# Patient Record
Sex: Female | Born: 1935 | Race: Black or African American | Hispanic: No | Marital: Single | Smoking: Former smoker
Health system: Southern US, Community
[De-identification: ages and names within clinical notes are randomized; demographics above are authoritative.]

## PROBLEM LIST (undated history)

## (undated) DIAGNOSIS — R011 Cardiac murmur, unspecified: Secondary | ICD-10-CM

## (undated) DIAGNOSIS — M4800 Spinal stenosis, site unspecified: Secondary | ICD-10-CM

## (undated) DIAGNOSIS — F0390 Unspecified dementia without behavioral disturbance: Secondary | ICD-10-CM

## (undated) DIAGNOSIS — I441 Atrioventricular block, second degree: Secondary | ICD-10-CM

## (undated) DIAGNOSIS — I10 Essential (primary) hypertension: Secondary | ICD-10-CM

## (undated) DIAGNOSIS — M1990 Unspecified osteoarthritis, unspecified site: Secondary | ICD-10-CM

## (undated) DIAGNOSIS — I4891 Unspecified atrial fibrillation: Secondary | ICD-10-CM

## (undated) DIAGNOSIS — N189 Chronic kidney disease, unspecified: Secondary | ICD-10-CM

## (undated) DIAGNOSIS — F05 Delirium due to known physiological condition: Secondary | ICD-10-CM

## (undated) DIAGNOSIS — D649 Anemia, unspecified: Secondary | ICD-10-CM

## (undated) DIAGNOSIS — I509 Heart failure, unspecified: Secondary | ICD-10-CM

## (undated) DIAGNOSIS — I480 Paroxysmal atrial fibrillation: Secondary | ICD-10-CM

## (undated) DIAGNOSIS — M159 Polyosteoarthritis, unspecified: Secondary | ICD-10-CM

## (undated) HISTORY — DX: Cardiac murmur, unspecified: R01.1

## (undated) HISTORY — DX: Paroxysmal atrial fibrillation: I48.0

## (undated) HISTORY — DX: Chronic kidney disease, unspecified: N18.9

## (undated) HISTORY — DX: Heart failure, unspecified: I50.9

## (undated) HISTORY — DX: Polyosteoarthritis, unspecified: M15.9

## (undated) HISTORY — DX: Atrioventricular block, second degree: I44.1

## (undated) HISTORY — PX: TONSILLECTOMY: SUR1361

## (undated) HISTORY — DX: Anemia, unspecified: D64.9

## (undated) HISTORY — PX: NEPHRECTOMY: SHX65

---

## 1997-10-13 ENCOUNTER — Ambulatory Visit: Admission: RE | Admit: 1997-10-13 | Discharge: 1997-10-13 | Payer: Self-pay | Admitting: Internal Medicine

## 1998-05-22 ENCOUNTER — Ambulatory Visit (HOSPITAL_COMMUNITY): Admission: RE | Admit: 1998-05-22 | Discharge: 1998-05-22 | Payer: Self-pay | Admitting: Internal Medicine

## 1998-05-22 ENCOUNTER — Encounter: Payer: Self-pay | Admitting: Internal Medicine

## 1998-09-09 ENCOUNTER — Ambulatory Visit (HOSPITAL_COMMUNITY): Admission: RE | Admit: 1998-09-09 | Discharge: 1998-09-09 | Payer: Self-pay | Admitting: Gastroenterology

## 1998-11-19 ENCOUNTER — Ambulatory Visit (HOSPITAL_COMMUNITY): Admission: RE | Admit: 1998-11-19 | Discharge: 1998-11-19 | Payer: Self-pay | Admitting: Internal Medicine

## 1998-11-19 ENCOUNTER — Encounter: Payer: Self-pay | Admitting: Internal Medicine

## 1999-12-07 ENCOUNTER — Ambulatory Visit (HOSPITAL_COMMUNITY): Admission: RE | Admit: 1999-12-07 | Discharge: 1999-12-07 | Payer: Self-pay | Admitting: Internal Medicine

## 1999-12-07 ENCOUNTER — Encounter: Payer: Self-pay | Admitting: Internal Medicine

## 2000-12-08 ENCOUNTER — Encounter: Payer: Self-pay | Admitting: Internal Medicine

## 2000-12-08 ENCOUNTER — Ambulatory Visit (HOSPITAL_COMMUNITY): Admission: RE | Admit: 2000-12-08 | Discharge: 2000-12-08 | Payer: Self-pay | Admitting: Internal Medicine

## 2001-01-01 ENCOUNTER — Other Ambulatory Visit: Admission: RE | Admit: 2001-01-01 | Discharge: 2001-01-01 | Payer: Self-pay | Admitting: Internal Medicine

## 2002-02-12 ENCOUNTER — Ambulatory Visit (HOSPITAL_COMMUNITY): Admission: RE | Admit: 2002-02-12 | Discharge: 2002-02-12 | Payer: Self-pay | Admitting: Internal Medicine

## 2002-02-12 ENCOUNTER — Encounter: Payer: Self-pay | Admitting: Internal Medicine

## 2003-02-17 ENCOUNTER — Encounter: Payer: Self-pay | Admitting: Internal Medicine

## 2003-02-17 ENCOUNTER — Ambulatory Visit (HOSPITAL_COMMUNITY): Admission: RE | Admit: 2003-02-17 | Discharge: 2003-02-17 | Payer: Self-pay | Admitting: Internal Medicine

## 2004-02-19 ENCOUNTER — Ambulatory Visit (HOSPITAL_COMMUNITY): Admission: RE | Admit: 2004-02-19 | Discharge: 2004-02-19 | Payer: Self-pay | Admitting: Internal Medicine

## 2004-03-19 ENCOUNTER — Emergency Department (HOSPITAL_COMMUNITY): Admission: EM | Admit: 2004-03-19 | Discharge: 2004-03-19 | Payer: Self-pay | Admitting: Emergency Medicine

## 2005-02-21 ENCOUNTER — Ambulatory Visit (HOSPITAL_COMMUNITY): Admission: RE | Admit: 2005-02-21 | Discharge: 2005-02-21 | Payer: Self-pay | Admitting: Internal Medicine

## 2006-02-22 ENCOUNTER — Ambulatory Visit (HOSPITAL_COMMUNITY): Admission: RE | Admit: 2006-02-22 | Discharge: 2006-02-22 | Payer: Self-pay | Admitting: Internal Medicine

## 2006-06-05 ENCOUNTER — Encounter: Admission: RE | Admit: 2006-06-05 | Discharge: 2006-06-05 | Payer: Self-pay | Admitting: Internal Medicine

## 2006-06-20 ENCOUNTER — Encounter: Admission: RE | Admit: 2006-06-20 | Discharge: 2006-06-20 | Payer: Self-pay | Admitting: Internal Medicine

## 2006-06-22 ENCOUNTER — Emergency Department (HOSPITAL_COMMUNITY): Admission: EM | Admit: 2006-06-22 | Discharge: 2006-06-22 | Payer: Self-pay | Admitting: Emergency Medicine

## 2006-07-07 ENCOUNTER — Encounter: Admission: RE | Admit: 2006-07-07 | Discharge: 2006-07-07 | Payer: Self-pay | Admitting: Internal Medicine

## 2006-07-28 ENCOUNTER — Ambulatory Visit (HOSPITAL_COMMUNITY): Admission: RE | Admit: 2006-07-28 | Discharge: 2006-07-28 | Payer: Self-pay | Admitting: Urology

## 2006-08-11 ENCOUNTER — Ambulatory Visit (HOSPITAL_COMMUNITY): Admission: RE | Admit: 2006-08-11 | Discharge: 2006-08-11 | Payer: Self-pay | Admitting: Urology

## 2006-08-17 ENCOUNTER — Encounter: Admission: RE | Admit: 2006-08-17 | Discharge: 2006-08-17 | Payer: Self-pay | Admitting: Urology

## 2006-09-18 ENCOUNTER — Ambulatory Visit: Payer: Self-pay | Admitting: Cardiology

## 2006-09-18 ENCOUNTER — Inpatient Hospital Stay (HOSPITAL_COMMUNITY): Admission: RE | Admit: 2006-09-18 | Discharge: 2006-09-24 | Payer: Self-pay | Admitting: Urology

## 2006-09-18 ENCOUNTER — Encounter (INDEPENDENT_AMBULATORY_CARE_PROVIDER_SITE_OTHER): Payer: Self-pay | Admitting: Specialist

## 2006-09-22 ENCOUNTER — Encounter: Payer: Self-pay | Admitting: Cardiology

## 2006-09-28 ENCOUNTER — Emergency Department (HOSPITAL_COMMUNITY): Admission: EM | Admit: 2006-09-28 | Discharge: 2006-09-28 | Payer: Self-pay | Admitting: Emergency Medicine

## 2006-09-28 ENCOUNTER — Encounter: Payer: Self-pay | Admitting: Vascular Surgery

## 2006-09-28 ENCOUNTER — Ambulatory Visit: Payer: Self-pay | Admitting: Vascular Surgery

## 2007-02-17 ENCOUNTER — Emergency Department (HOSPITAL_COMMUNITY): Admission: EM | Admit: 2007-02-17 | Discharge: 2007-02-17 | Payer: Self-pay | Admitting: Family Medicine

## 2007-02-27 ENCOUNTER — Ambulatory Visit (HOSPITAL_COMMUNITY): Admission: RE | Admit: 2007-02-27 | Discharge: 2007-02-27 | Payer: Self-pay | Admitting: *Deleted

## 2007-03-08 ENCOUNTER — Encounter: Admission: RE | Admit: 2007-03-08 | Discharge: 2007-03-08 | Payer: Self-pay | Admitting: *Deleted

## 2007-03-09 ENCOUNTER — Observation Stay (HOSPITAL_COMMUNITY): Admission: EM | Admit: 2007-03-09 | Discharge: 2007-03-10 | Payer: Self-pay | Admitting: Emergency Medicine

## 2008-01-01 ENCOUNTER — Emergency Department (HOSPITAL_COMMUNITY): Admission: EM | Admit: 2008-01-01 | Discharge: 2008-01-01 | Payer: Self-pay | Admitting: Family Medicine

## 2008-03-20 ENCOUNTER — Ambulatory Visit (HOSPITAL_COMMUNITY): Admission: RE | Admit: 2008-03-20 | Discharge: 2008-03-20 | Payer: Self-pay | Admitting: Family Medicine

## 2008-07-23 ENCOUNTER — Ambulatory Visit: Payer: Self-pay | Admitting: Diagnostic Radiology

## 2008-07-23 ENCOUNTER — Emergency Department (HOSPITAL_BASED_OUTPATIENT_CLINIC_OR_DEPARTMENT_OTHER): Admission: EM | Admit: 2008-07-23 | Discharge: 2008-07-23 | Payer: Self-pay | Admitting: Emergency Medicine

## 2008-10-08 ENCOUNTER — Encounter: Admission: RE | Admit: 2008-10-08 | Discharge: 2008-10-08 | Payer: Self-pay | Admitting: Family Medicine

## 2009-03-23 ENCOUNTER — Ambulatory Visit (HOSPITAL_COMMUNITY): Admission: RE | Admit: 2009-03-23 | Discharge: 2009-03-23 | Payer: Self-pay | Admitting: Family Medicine

## 2009-08-18 ENCOUNTER — Ambulatory Visit: Payer: Self-pay | Admitting: Interventional Radiology

## 2009-08-18 ENCOUNTER — Emergency Department (HOSPITAL_BASED_OUTPATIENT_CLINIC_OR_DEPARTMENT_OTHER): Admission: EM | Admit: 2009-08-18 | Discharge: 2009-08-18 | Payer: Self-pay | Admitting: Emergency Medicine

## 2010-07-16 ENCOUNTER — Encounter
Admission: RE | Admit: 2010-07-16 | Discharge: 2010-07-16 | Payer: Self-pay | Source: Home / Self Care | Attending: Family Medicine | Admitting: Family Medicine

## 2010-08-01 ENCOUNTER — Encounter: Payer: Self-pay | Admitting: Internal Medicine

## 2010-08-01 ENCOUNTER — Encounter: Payer: Self-pay | Admitting: Gastroenterology

## 2010-09-17 ENCOUNTER — Other Ambulatory Visit: Payer: Self-pay | Admitting: Family Medicine

## 2010-09-17 ENCOUNTER — Ambulatory Visit
Admission: RE | Admit: 2010-09-17 | Discharge: 2010-09-17 | Disposition: A | Discharge status: Home / Self Care | Payer: No Typology Code available for payment source | Source: Ambulatory Visit | Attending: Family Medicine | Admitting: Family Medicine

## 2010-09-17 DIAGNOSIS — W19XXXA Unspecified fall, initial encounter: Secondary | ICD-10-CM

## 2010-09-28 ENCOUNTER — Emergency Department (HOSPITAL_COMMUNITY): Payer: Medicare (Managed Care)

## 2010-09-28 ENCOUNTER — Emergency Department (HOSPITAL_COMMUNITY)
Admission: EM | Admit: 2010-09-28 | Discharge: 2010-09-28 | Disposition: A | Discharge status: Home / Self Care | Payer: Medicare (Managed Care) | Attending: Emergency Medicine | Admitting: Emergency Medicine

## 2010-09-28 DIAGNOSIS — F0280 Dementia in other diseases classified elsewhere without behavioral disturbance: Secondary | ICD-10-CM | POA: Insufficient documentation

## 2010-09-28 DIAGNOSIS — R0609 Other forms of dyspnea: Secondary | ICD-10-CM | POA: Insufficient documentation

## 2010-09-28 DIAGNOSIS — R0989 Other specified symptoms and signs involving the circulatory and respiratory systems: Secondary | ICD-10-CM | POA: Insufficient documentation

## 2010-09-28 DIAGNOSIS — R42 Dizziness and giddiness: Secondary | ICD-10-CM | POA: Insufficient documentation

## 2010-09-28 DIAGNOSIS — R609 Edema, unspecified: Secondary | ICD-10-CM | POA: Insufficient documentation

## 2010-09-28 DIAGNOSIS — G309 Alzheimer's disease, unspecified: Secondary | ICD-10-CM | POA: Insufficient documentation

## 2010-09-28 DIAGNOSIS — M069 Rheumatoid arthritis, unspecified: Secondary | ICD-10-CM | POA: Insufficient documentation

## 2010-09-28 DIAGNOSIS — Z79899 Other long term (current) drug therapy: Secondary | ICD-10-CM | POA: Insufficient documentation

## 2010-09-28 DIAGNOSIS — I10 Essential (primary) hypertension: Secondary | ICD-10-CM | POA: Insufficient documentation

## 2010-09-28 LAB — BASIC METABOLIC PANEL
CO2: 24 mEq/L (ref 19–32)
Calcium: 8.5 mg/dL (ref 8.4–10.5)
Chloride: 108 mEq/L (ref 96–112)
GFR calc Af Amer: 28 mL/min — ABNORMAL LOW (ref >60)
Sodium: 144 mEq/L (ref 135–145)

## 2010-09-28 LAB — CBC
HCT: 26.2 % — ABNORMAL LOW (ref 36.0–46.0)
Hemoglobin: 9.2 g/dL — ABNORMAL LOW (ref 12.0–15.0)
MCHC: 35.1 g/dL (ref 30.0–36.0)
MCV: 85.6 fL (ref 78.0–100.0)
WBC: 8.5 10*3/uL (ref 4.0–10.5)

## 2010-09-28 LAB — POCT CARDIAC MARKERS
CKMB, poc: 6.9 ng/mL (ref 1.0–8.0)
Myoglobin, poc: >500 ng/mL (ref 12–200)
Troponin i, poc: 0.09 ng/mL (ref 0.00–0.09)

## 2010-09-28 LAB — DIFFERENTIAL
Basophils Absolute: 0 10*3/uL (ref 0.0–0.1)
Basophils Relative: 0 % (ref 0–1)
Eosinophils Absolute: 0 10*3/uL (ref 0.0–0.7)
Eosinophils Relative: 1 % (ref 0–5)
Lymphocytes Relative: 29 % (ref 12–46)
Monocytes Absolute: 0.8 10*3/uL (ref 0.1–1.0)
Neutrophils Relative %: 61 % (ref 43–77)

## 2010-09-28 LAB — BRAIN NATRIURETIC PEPTIDE: Pro B Natriuretic peptide (BNP): 156 pg/mL — ABNORMAL HIGH (ref 0.0–100.0)

## 2010-09-28 LAB — URINALYSIS, ROUTINE W REFLEX MICROSCOPIC
Hgb urine dipstick: NEGATIVE
Protein, ur: 30 mg/dL — AB
Specific Gravity, Urine: 1.017 (ref 1.005–1.030)
Urobilinogen, UA: 1 mg/dL (ref 0.0–1.0)

## 2010-09-28 LAB — URINE MICROSCOPIC-ADD ON

## 2010-09-29 ENCOUNTER — Other Ambulatory Visit: Payer: Medicare Other | Admitting: Sports Medicine

## 2010-09-29 ENCOUNTER — Emergency Department (HOSPITAL_COMMUNITY): Payer: Medicare (Managed Care)

## 2010-09-29 ENCOUNTER — Ambulatory Visit (INDEPENDENT_AMBULATORY_CARE_PROVIDER_SITE_OTHER): Payer: Medicare Other | Admitting: Family Medicine

## 2010-09-29 ENCOUNTER — Inpatient Hospital Stay (HOSPITAL_COMMUNITY)
Admission: EM | Admit: 2010-09-29 | Discharge: 2010-10-04 | DRG: 872 | Disposition: A | Discharge status: Home Health | Payer: Medicare (Managed Care) | Attending: Family Medicine | Admitting: Family Medicine

## 2010-09-29 DIAGNOSIS — N179 Acute kidney failure, unspecified: Secondary | ICD-10-CM | POA: Diagnosis present

## 2010-09-29 DIAGNOSIS — R6510 Systemic inflammatory response syndrome (SIRS) of non-infectious origin without acute organ dysfunction: Secondary | ICD-10-CM | POA: Diagnosis present

## 2010-09-29 DIAGNOSIS — D649 Anemia, unspecified: Secondary | ICD-10-CM | POA: Diagnosis present

## 2010-09-29 DIAGNOSIS — M810 Age-related osteoporosis without current pathological fracture: Secondary | ICD-10-CM | POA: Diagnosis present

## 2010-09-29 DIAGNOSIS — I129 Hypertensive chronic kidney disease with stage 1 through stage 4 chronic kidney disease, or unspecified chronic kidney disease: Secondary | ICD-10-CM | POA: Diagnosis present

## 2010-09-29 DIAGNOSIS — S40029A Contusion of unspecified upper arm, initial encounter: Secondary | ICD-10-CM | POA: Diagnosis present

## 2010-09-29 DIAGNOSIS — S46909A Unspecified injury of unspecified muscle, fascia and tendon at shoulder and upper arm level, unspecified arm, initial encounter: Secondary | ICD-10-CM | POA: Diagnosis present

## 2010-09-29 DIAGNOSIS — E860 Dehydration: Secondary | ICD-10-CM | POA: Diagnosis present

## 2010-09-29 DIAGNOSIS — I959 Hypotension, unspecified: Secondary | ICD-10-CM

## 2010-09-29 DIAGNOSIS — Z9181 History of falling: Secondary | ICD-10-CM

## 2010-09-29 DIAGNOSIS — I4891 Unspecified atrial fibrillation: Secondary | ICD-10-CM | POA: Diagnosis present

## 2010-09-29 DIAGNOSIS — N184 Chronic kidney disease, stage 4 (severe): Secondary | ICD-10-CM | POA: Diagnosis present

## 2010-09-29 DIAGNOSIS — F068 Other specified mental disorders due to known physiological condition: Secondary | ICD-10-CM | POA: Diagnosis present

## 2010-09-29 DIAGNOSIS — I509 Heart failure, unspecified: Secondary | ICD-10-CM | POA: Diagnosis present

## 2010-09-29 DIAGNOSIS — S41109A Unspecified open wound of unspecified upper arm, initial encounter: Secondary | ICD-10-CM | POA: Diagnosis present

## 2010-09-29 DIAGNOSIS — A419 Sepsis, unspecified organism: Principal | ICD-10-CM | POA: Diagnosis present

## 2010-09-29 DIAGNOSIS — Z7982 Long term (current) use of aspirin: Secondary | ICD-10-CM

## 2010-09-29 LAB — COMPREHENSIVE METABOLIC PANEL
AST: 42 U/L — ABNORMAL HIGH (ref 0–37)
Albumin: 2.7 g/dL — ABNORMAL LOW (ref 3.5–5.2)
Alkaline Phosphatase: 84 U/L (ref 39–117)
BUN: 40 mg/dL — ABNORMAL HIGH (ref 6–23)
Chloride: 109 mEq/L (ref 96–112)
GFR calc Af Amer: 24 mL/min — ABNORMAL LOW (ref >60)
Potassium: 4.1 mEq/L (ref 3.5–5.1)
Sodium: 141 mEq/L (ref 135–145)
Total Bilirubin: 0.7 mg/dL (ref 0.3–1.2)
Total Protein: 6.5 g/dL (ref 6.0–8.3)

## 2010-09-29 LAB — DIFFERENTIAL
Basophils Absolute: 0 10*3/uL (ref 0.0–0.1)
Basophils Relative: 0 % (ref 0–1)
Eosinophils Absolute: 0 10*3/uL (ref 0.0–0.7)
Eosinophils Relative: 1 % (ref 0–5)
Lymphs Abs: 2 10*3/uL (ref 0.7–4.0)
Neutrophils Relative %: 64 % (ref 43–77)

## 2010-09-29 LAB — CK TOTAL AND CKMB (NOT AT ARMC)
CK, MB: 4.2 ng/mL — ABNORMAL HIGH (ref 0.3–4.0)
Total CK: 497 U/L — ABNORMAL HIGH (ref 7–177)

## 2010-09-29 LAB — URINE CULTURE
Colony Count: NO GROWTH
Culture  Setup Time: 201203201335
Culture: NO GROWTH

## 2010-09-29 LAB — URINALYSIS, ROUTINE W REFLEX MICROSCOPIC
Glucose, UA: NEGATIVE mg/dL
Ketones, ur: NEGATIVE mg/dL
Nitrite: NEGATIVE
Protein, ur: NEGATIVE mg/dL
pH: 6 (ref 5.0–8.0)

## 2010-09-29 LAB — PROCALCITONIN: Procalcitonin: <0.1 ng/mL

## 2010-09-29 LAB — GLUCOSE, CAPILLARY: Glucose-Capillary: 87 mg/dL (ref 70–99)

## 2010-09-29 LAB — CBC
Platelets: 174 10*3/uL (ref 150–400)
RBC: 2.82 MIL/uL — ABNORMAL LOW (ref 3.87–5.11)
RDW: 16.8 % — ABNORMAL HIGH (ref 11.5–15.5)
WBC: 7.7 10*3/uL (ref 4.0–10.5)

## 2010-09-29 LAB — TROPONIN I: Troponin I: 0.05 ng/mL (ref 0.00–0.06)

## 2010-09-30 ENCOUNTER — Inpatient Hospital Stay (HOSPITAL_COMMUNITY): Payer: Medicare (Managed Care)

## 2010-09-30 ENCOUNTER — Ambulatory Visit (INDEPENDENT_AMBULATORY_CARE_PROVIDER_SITE_OTHER): Payer: Medicare Other | Admitting: Family Medicine

## 2010-09-30 VITALS — BP 80/35 | HR 110 | Resp 20

## 2010-09-30 DIAGNOSIS — R4182 Altered mental status, unspecified: Secondary | ICD-10-CM

## 2010-09-30 DIAGNOSIS — M79609 Pain in unspecified limb: Secondary | ICD-10-CM

## 2010-09-30 DIAGNOSIS — I359 Nonrheumatic aortic valve disorder, unspecified: Secondary | ICD-10-CM

## 2010-09-30 DIAGNOSIS — M7989 Other specified soft tissue disorders: Secondary | ICD-10-CM

## 2010-09-30 LAB — CARDIAC PANEL(CRET KIN+CKTOT+MB+TROPI)
CK, MB: 2.9 ng/mL (ref 0.3–4.0)
Relative Index: 1.2 (ref 0.0–2.5)
Total CK: 349 U/L — ABNORMAL HIGH (ref 7–177)
Troponin I: 0.05 ng/mL (ref 0.00–0.06)

## 2010-09-30 LAB — BASIC METABOLIC PANEL
BUN: 39 mg/dL — ABNORMAL HIGH (ref 6–23)
CO2: 21 mEq/L (ref 19–32)
Calcium: 7.8 mg/dL — ABNORMAL LOW (ref 8.4–10.5)
Creatinine, Ser: 2.27 mg/dL — ABNORMAL HIGH (ref 0.4–1.2)
GFR calc non Af Amer: 20 mL/min — ABNORMAL LOW (ref >60)
Glucose, Bld: 68 mg/dL — ABNORMAL LOW (ref 70–99)

## 2010-09-30 LAB — CBC
HCT: 20.5 % — ABNORMAL LOW (ref 36.0–46.0)
HCT: 18.6 % — ABNORMAL LOW (ref 36.0–46.0)
Hemoglobin: 6.8 g/dL — CL (ref 12.0–15.0)
MCH: 31.6 pg (ref 26.0–34.0)
MCHC: 36.6 g/dL — ABNORMAL HIGH (ref 30.0–36.0)
MCHC: 36.6 g/dL — ABNORMAL HIGH (ref 30.0–36.0)
MCV: 85.8 fL (ref 78.0–100.0)
MCV: 86.3 fL (ref 78.0–100.0)
MCV: 86.5 fL (ref 78.0–100.0)
Platelets: 175 10*3/uL (ref 150–400)
Platelets: 184 10*3/uL (ref 150–400)
RBC: 2.11 MIL/uL — ABNORMAL LOW (ref 3.87–5.11)
RDW: 16.9 % — ABNORMAL HIGH (ref 11.5–15.5)
WBC: 8 10*3/uL (ref 4.0–10.5)

## 2010-09-30 LAB — GLUCOSE, CAPILLARY
Glucose-Capillary: 144 mg/dL — ABNORMAL HIGH (ref 70–99)
Glucose-Capillary: 61 mg/dL — ABNORMAL LOW (ref 70–99)

## 2010-09-30 LAB — URINE CULTURE: Colony Count: NO GROWTH

## 2010-09-30 LAB — ABO/RH: ABO/RH(D): O POS

## 2010-09-30 LAB — HEPARIN LEVEL (UNFRACTIONATED): Heparin Unfractionated: 0.49 IU/mL (ref 0.30–0.70)

## 2010-09-30 NOTE — Progress Notes (Signed)
Subjective:    Patient ID: Kathleen [name] is a 77 y.o. female.  Chief Complaint: HPI Patient placed on am schedule for Dr Darrick Penna but family was unable to transport. They brought her thios afternoon to [facility] and I saw her emergently.  CC: 2-3 weeks ago she had a fall at home where she trapped her arm/shoulder  (right) briefly between a piece of furniture and either the wall or another piece of furniture. She  Had an x ray done of her elbow a few days later for continud pain.  She has complained of right arm / shoulder pain since and the right upper extremity was noted by family to be bruised and swollen.  Yesterday the family took her to the ED for arm pain and some decrease in mental status. [facility] and was evidently seen by [facility] physician there (Dr Dorothe Pea). Was diagnosed with uti and given some meds--family says she was given an antibiotic and something for dementia. (I have attempted review of ED visit and i tis done by Dr Nino Parsley and does not mention medicine for dementia).  This morning she was not as alert and seemed to be in a lot of pain with her right UE. She presents now to [facility] for me to evaluate right upper extremity.   ROS     Unable to obtain except from family as in HPI  As patient is non responsive to any veral stimuli. She is minimally responsive to deep stimuli (chest rub)  Objective:   Ortho Exam  Patient is an elderly AA female slumped in a wheel chair, head lolling backward. She is responsive minimally to sternal rub. Her mouth is hanging open and she has obviously very dry mucous membranes. Her pulse is 110-120 and seems slightly irregular. Her right upper extremity is swollen, multiple echymoses, She is warm ans slightly clammy to the touch.     Assessment:     . Severely ill elderly patient--differential includes SIRS/sepsis, CVA, or other major event. I briefly discussed this with her two grand-daughters who brought her and have talked via  phone to Ellison Hughs (daughter). I advise emergent transfer via ambulance or car to the ED for further evaluation. I do not know how this works with the [employer], but I have discussed with our inpatient doctor (Dr Mauricio Po) and we will be happy to admit her to our service. Potentially she has an UE thrombosis as well. I have spoke to the triage nurse at [facility] and she has my pager number as well as the admitting residents info.    Plan:    Emergent transfer to ED

## 2010-10-01 LAB — BASIC METABOLIC PANEL
CO2: 23 mEq/L (ref 19–32)
Calcium: 7.5 mg/dL — ABNORMAL LOW (ref 8.4–10.5)
GFR calc Af Amer: 24 mL/min — ABNORMAL LOW (ref >60)
GFR calc non Af Amer: 20 mL/min — ABNORMAL LOW (ref >60)
Glucose, Bld: 119 mg/dL — ABNORMAL HIGH (ref 70–99)
Potassium: 3.5 mEq/L (ref 3.5–5.1)
Sodium: 145 mEq/L (ref 135–145)

## 2010-10-01 LAB — CBC
HCT: 17.9 % — ABNORMAL LOW (ref 36.0–46.0)
Hemoglobin: 6.5 g/dL — CL (ref 12.0–15.0)
MCV: 84.4 fL (ref 78.0–100.0)
Platelets: ADEQUATE 10*3/uL (ref 150–400)
RBC: 3.21 MIL/uL — ABNORMAL LOW (ref 3.87–5.11)
RDW: 18.3 % — ABNORMAL HIGH (ref 11.5–15.5)
WBC: 6.7 10*3/uL (ref 4.0–10.5)
WBC: 8.2 10*3/uL (ref 4.0–10.5)

## 2010-10-01 LAB — PROCALCITONIN: Procalcitonin: 0.19 ng/mL

## 2010-10-02 LAB — BASIC METABOLIC PANEL
CO2: 16 mEq/L — ABNORMAL LOW (ref 19–32)
Chloride: 119 mEq/L — ABNORMAL HIGH (ref 96–112)
GFR calc Af Amer: 29 mL/min — ABNORMAL LOW (ref >60)
Potassium: 4.5 mEq/L (ref 3.5–5.1)
Sodium: 147 mEq/L — ABNORMAL HIGH (ref 135–145)

## 2010-10-02 LAB — CULTURE, BLOOD (ROUTINE X 2): Culture  Setup Time: 201203212201

## 2010-10-02 LAB — CROSSMATCH: Unit division: 0

## 2010-10-02 LAB — CBC
Hemoglobin: 9.2 g/dL — ABNORMAL LOW (ref 12.0–15.0)
MCH: 29.9 pg (ref 26.0–34.0)
Platelets: 208 10*3/uL (ref 150–400)
RBC: 3.08 MIL/uL — ABNORMAL LOW (ref 3.87–5.11)
WBC: 9.4 10*3/uL (ref 4.0–10.5)

## 2010-10-03 LAB — BASIC METABOLIC PANEL
CO2: 23 mEq/L (ref 19–32)
Calcium: 8.6 mg/dL (ref 8.4–10.5)
GFR calc Af Amer: 37 mL/min — ABNORMAL LOW (ref >60)
GFR calc non Af Amer: 31 mL/min — ABNORMAL LOW (ref >60)
Sodium: 144 mEq/L (ref 135–145)

## 2010-10-03 LAB — CBC
Hemoglobin: 9.8 g/dL — ABNORMAL LOW (ref 12.0–15.0)
MCHC: 35.9 g/dL (ref 30.0–36.0)
RDW: 18 % — ABNORMAL HIGH (ref 11.5–15.5)
WBC: 9.7 10*3/uL (ref 4.0–10.5)

## 2010-10-04 LAB — BASIC METABOLIC PANEL
Chloride: 110 mEq/L (ref 96–112)
GFR calc Af Amer: 46 mL/min — ABNORMAL LOW (ref >60)
Potassium: 3.4 mEq/L — ABNORMAL LOW (ref 3.5–5.1)

## 2010-10-04 LAB — CBC
Platelets: 262 10*3/uL (ref 150–400)
RBC: 3.04 MIL/uL — ABNORMAL LOW (ref 3.87–5.11)
WBC: 10 10*3/uL (ref 4.0–10.5)

## 2010-10-04 LAB — TSH: TSH: 2.131 u[IU]/mL (ref 0.350–4.500)

## 2010-10-04 NOTE — H&P (Signed)
NAMEAUDIA, [name]            ACCOUNT NO.:  [id number]  MEDICAL RECORD NO.:  [id number]           PATIENT TYPE:  I  LOCATION:  3305                         FACILITY:  [facility]  PHYSICIAN:  Paula Compton, MD        DATE OF BIRTH:  1923/01/22  DATE OF ADMISSION:  09/29/2010 DATE OF DISCHARGE:                             HISTORY & PHYSICAL   CHIEF COMPLAINT:  Altered mental status and shoulder pain.  PRIMARY CARE PHYSICIAN:  Dr. Dorothe Pea with the [facility].  HISTORY OF PRESENT ILLNESS:  This is an 77 year old female with history of dementia who [facility] due to hypotension, hypothermia, and altered mental status.  According to family, problems began with a fall onto her right shoulder that occurred several weeks ago.  She has had pain and swelling gradually worsening despite conservative management.  She has mild baseline dementia but has had waxing and waning of alertness and mental status,especially for the past 4 days.  Yesterday, she presented to the emergency department for altered mental status and was discharged home with prescription of Cipro for UTI.  She then developed diaphoresis, fever, and progression of altered mental status with decreased responsiveness. [facility]. The patient was transferred to  the ED due to her septic appearance and hypotension, hypothermia, for further evaluation of her symptoms.  REVIEW OF SYSTEMS:  Level 5 caveat due to AMS; per family: Cough x2 days, increased sputum production and fevers; Family denies complaints of dysuria, pain, or vomiting.  PAST MEDICAL HISTORY: 1. Mild CHF. 2. AFib. 3. Hypertension. 4. Osteoporosis. 5. Question of solitary kidney and stage IV CKD.  MEDICATIONS:  Unknown as the patient has list at home.  ALLERGIES:  No known drug allergies.  SOCIAL HISTORY:  Remote tobacco and ethanol use.  The patient  lives alone with nightly visits from various family members.  FAMILY HISTORY: Limited due to altered mental status.  PHYSICAL EXAMINATION:  VITAL SIGNS:  Temperature unmeasurable, BP 74/32, pulse 80, respirations 20, 92% on 10 L nasal cannula. GENERAL APPEARANCE:  Lethargic with inconsistent alertness and responsiveness to commands. HEENT:  EOMI, PERRLA, and CTA.  Mucous membranes dry.  No oropharyngeal lesions. CARDIOVASCULAR:  Irregularly irregular rhythm.  No murmurs or gallops auscultated. PULMONARY:  Some possible retractions.  Clear to auscultation bilaterally in anterior fields and no wheezing. ABDOMEN:  Bowel sounds hyperactive, nontender, nondistended.  No masses palpated. EXTREMITIES:  Right upper extremity with generalized 1+ swelling in the pectoral area down her biceps and forearm with warmth and erythema. EXTREMITIES:  Well perfused and radial pulse present.  Mildly decreased passive range of motion and mild tenderness to palpation elicited.  DP 2+ pulses bilaterally.  No lower extremity or left upper extremity edema. NEUROLOGIC:  Consciousness waxes and wanes, but no focal motor deficits and does use synapsis, is oriented to person.  LABORATORY DATA:  White blood count 7.4, hemoglobin 8.8, platelets 174,000.  Sodium 141, potassium 4.1, chloride 109, bicarb 24, BUN 40, creatinine 2.34, glucose 87.  AST 42, ALT 35, alk phos 84, albumin  2.7, calcium 7.8, lactate 1.3.  BNP from September 28, 2010, was 156.  IMAGING:  EKG, 72 beats per minute, supraventricular bigeminy in LVH.  ASSESSMENT AND PLAN:  An 77 year old female with history of dementia, congestive heart failure, and atrial fibrillation with a recent shoulder injury who presents with shoulder swelling and systemic inflammatory response syndrome criteria. 1. Systemic inflammatory response syndrome.  The patient exhibits     hypotension and hypothermia.  Uncertain of clear infectious     etiology; however, UA in  the ED on September 28, 2010, had positive     nitrites and no culture growth.  We will start vanc and Zosyn     empirically to cover for potential infections as pneumonia is     possible with her cough.  We will obtain blood cultures and repeat     chest x-ray with a more sensitive PA and lateral view.  Less likely     etiology includes thrombophlebitis or septic joint picture.  We     will consider musculoskeletal imaging if workup is unrevealing.  We     will obtain echocardiogram and cardiac enzymes to evaluate for     possible cardiac etiology of this hypotension.  The patient     received 1 L normal saline bolus and her blood pressure improved to     the 90s/60s and we will give one additional liter of normal saline     and reevaluate her response.  Procalcitonin and lactate are     pending. 2. Shoulder/arm edema.  There is concern for possible DVT post trauma     given her generalized swelling and respiratory distress.  We will     start heparin drip empirically for presumed DVT and order upper     extremity Dopplers to evaluate this.  If these are negative, we     will consider other imaging if possible trauma and discontinue this     heparin. 3. Pulmonary.  Some moderate respiratory distress and O2 requirement     currently.  Possibly due to altered mental status versus infection     versus VTE.  Chest x-ray yesterday did not show definite opacities,     but we will repeat PA and lateral today to evaluate for possible     infection or developing edema.  The patient does have history of     mild CHF and we will hydrate gently and follow echo when completed. 4. Cardiology.  Holding home beta-blocker acutely with hypotension.     EKG showing AFib yesterday with rate in the 80s currently and EKG     today showing bigeminy without atrial fibrillation.  Cardiac     enzymes and echo are ordered to assess potential occult ischemia     and CHF status. 5. Hypertension.  Home meds are  uncertain but would hold them acutely.     We will follow up med rec and restart as necessary. 6. Acute on chronic renal failure.  Unsure of current baseline     creatinine but was 2.0 yesterday in the emergency department.     Today, it has increased to 2.34, likely secondary to hypotension     and hypoperfusion.  We will continue fluid hydration gently with     consideration of a CHF history. 7. Fluids, electrolytes, nutrition.  The patient is n.p.o. while     altered mental status and will follow up with bedside swallow eval     when able to  participate and may consider initiating diet after     her AMS improves.  We will complete 2 L total normal saline bolus     and then reassess fluid status for continued need.  We will follow     up electrolytes in a.m. 8. Prophylaxis.  The patient is on heparin IV treatment dose for     presumed DVT currently.  Start Protonix daily, 9. Disposition.  The patient's daughter, Kathleen [name], is     healthcare power of attorney and expresses desire for full code     status currently.  The patient does live alone with family's     assistance and we will reevaluate this on an ongoing basis.  The     patient will be admitted to step-down unit.    ______________________________ Lloyd Huger, MD   ______________________________ Paula Compton, MD    JK/MEDQ  D:  09/29/2010  T:  09/30/2010  Job:  [id number]  Electronically Signed by Lloyd Huger MD on 10/02/2010 06:32:14 PM Electronically Signed by Paula Compton MD on 10/04/2010 10:52:54 AM

## 2010-10-05 LAB — CULTURE, BLOOD (ROUTINE X 2): Culture  Setup Time: 201203212201

## 2010-10-11 NOTE — Progress Notes (Signed)
Patient seen and evaluated by Dr. Jennette Kettle, concern for stroke vs sepsis.  She was taken to the ED immediately by her family.

## 2010-10-13 ENCOUNTER — Ambulatory Visit (INDEPENDENT_AMBULATORY_CARE_PROVIDER_SITE_OTHER): Payer: Medicare Other | Admitting: Family Medicine

## 2010-10-13 DIAGNOSIS — F0390 Unspecified dementia without behavioral disturbance: Secondary | ICD-10-CM | POA: Insufficient documentation

## 2010-10-13 DIAGNOSIS — I10 Essential (primary) hypertension: Secondary | ICD-10-CM

## 2010-10-13 DIAGNOSIS — I509 Heart failure, unspecified: Secondary | ICD-10-CM

## 2010-10-13 DIAGNOSIS — I4891 Unspecified atrial fibrillation: Secondary | ICD-10-CM

## 2010-10-13 DIAGNOSIS — I480 Paroxysmal atrial fibrillation: Secondary | ICD-10-CM

## 2010-10-13 DIAGNOSIS — M810 Age-related osteoporosis without current pathological fracture: Secondary | ICD-10-CM

## 2010-10-13 DIAGNOSIS — N183 Chronic kidney disease, stage 3 unspecified: Secondary | ICD-10-CM

## 2010-10-13 HISTORY — DX: Paroxysmal atrial fibrillation: I48.0

## 2010-10-13 NOTE — Assessment & Plan Note (Signed)
Stable We discussed safety issues at home---they are doing a great job  Of supervising her No issues with agitation or wandering a tthis time Appetite seems good Wears depends but still toilets as well (with assistance) Home health has been out for safety eval and to help her get back using her walker post hosp

## 2010-10-13 NOTE — Progress Notes (Signed)
  Subjective:    Patient ID: Kathleen [name], female    DOB: 1923/01/03, 77 y.o.   MRN: [id number]  HPI  Hospital followup. She is here with her daughter and granddaughter to do most of her caretaking. She is doing extremely well. No problems with any of her medications. She is sleeping fairly well at night without any wandering episodes. She always has someone with her, one of the family members. She is currently using a walker around the house but there is always an assistant with her. She has a bedside commode. Her family has decided to take her at the pace program and put her back into a day care program at her church, which essentially is half a day. She is previously been Kathleen [name] doing a lot Review of Systems     Objective:   Physical Exam        Assessment & Plan:  #1 multiple medical problems. For details please see the problem associated plan notes located in the remainder of this note. Generally she is doing much better. Her right upper extremity is going to take months to become less painful and less swollen. She will never have complete use of that arm again. The issues for which he was admitted seen to have totally resolved. She does have some dementia but at this time they seem to have adequate care at home. I'll see her in followup when necessary.

## 2010-10-13 NOTE — Assessment & Plan Note (Signed)
Rate controlled Will only do asa for anticoag as she is big fall risk Unknown how long she has had this

## 2010-10-25 NOTE — Discharge Summary (Signed)
NAMEOCTAVIE, WESTERHOLD            ACCOUNT NO.:  [id number]  MEDICAL RECORD NO.:  [id number]           PATIENT TYPE:  I  LOCATION:  4741                         FACILITY:  [facility]  PHYSICIAN:  Paula Compton, MD        DATE OF BIRTH:  21-Nov-1922  DATE OF ADMISSION:  09/29/2010 DATE OF DISCHARGE:  10/04/2010                              DISCHARGE SUMMARY   DISCHARGE. DIAGNOSES: 1. Delirium, resolved. 2. Dementia. 3. Hypotension and dehydration, resolved. 4. Acute renal failure, resolved. 5. Right biceps tendon tear with hematoma. 6. Paroxysmal atrial fibrillation. 7. Hypertension. 8. Mild congestive heart failure. 9. Stage IV chronic kidney disease. 10.Osteoporosis.  DISCHARGE MEDICATIONS: 1. Aspirin 81 mg p.o. daily. 2. Carvedilol 3.125 mg p.o. b.i.d. 3. Dialyvite OTC 1 tablet p.o. daily. 4. Vitamin D3 1000 units OTC p.o. daily. 5. Acetaminophen 650 mg p.o. q.8 h. p.r.n. for pain.  LABORATORY DATA/IMAGING: 1. Creatinine at discharge 1.33. 2. MRI of right humerus showing intrasubstance tear and hemorrhage in     the biceps muscle beginning in the origin of the short head     of the biceps tendon extending over 14 cm length of upper arm and     extensive full thickness retracted rotator cuff tear with     glenohumeral joint effusion and severe arthritis of glenohumeral     joint and acromioclavicular joint. 3. Plain film, right shoulder showed soft tissue destructive changes     distal clavicle may be due to osteolysis related to high rating     humeral head. 4. Chest x-ray showed no acute cardiopulmonary disease. 5. CT head showing no acute intracranial abnormality and re-     demonstrated deformity of the craniocervical junction with     subluxation of C1 on C2 unchanged. 6. Echocardiogram showing ejection fraction of 65-70% with grade 2     diastolic dysfunction. 7. Upper extremity Doppler showing no evidence of DVT and questionable     vascularization.  CONSULTS: 1.  Orthopedics. 2. Social Work. 3. PT/OT.  BRIEF HOSPITAL COURSE STAY:  An 77 year old female who presented with altered mental status, hypotension, and progressive right arm swelling after subacute injury. 1. Hypertension.  The patient was admitted due to concern for possible     systemic inflammatory syndrome with hypotension.  Her     blood pressure was as low as 80s over 40s and the patient had     associated altered mental status.  The patient was dosed with IV     fluids and did respond well to this with recovery of her blood     pressure.  The patient was also started on empiric antibiotics due to     concern for bacteremia. Culture growth was negative with     exception of coag-negative staph and one blood culture, which was     deemed contamination.  The patient's antibiotics were discontinued,     and she was monitored for several days maintaining her     blood pressures to the point of hypertension.     Her blood pressures was 130-150 systolic at the time of discharge  with diastolic values in the 70s.  The patient was continued on her     home dose of carvedilol.  Most likely this hypotension was     secondary to dehydration from altered mental status. 2. Altered mental status.  The patient has baseline dementia and     likely multifactorial causes for her waxing and waning mental     status over several weeks prior to admission.  She subacutely had a     shoulder injury likely contributing to this in concurrence with her     dehydration.  The patient had noted instances of decreased verbal     response and alertness, but also exhibited combativeness and     refused to take medications at certain times.  The patient was     given low-dose Haldol with mild improvement, but she responded best to redirection by family members. 3. Shoulder injury.  The patient presented with several weeks of     worsening right shoulder swelling after a fall onto the arm at     home.  On  initial presentation with her hypotension, there was     concern for possible DVT; however, upper extremity Dopplers did     rule out this possibility.  She was empirically started on      heparin drip until the Dopplers were negative and this was discontinued.  MRI imaging did     show an associated hematoma with her significant right biceps tear.     Orthopedics was consulted and recommended conservative management as the patient is     not a surgical candidate.  She was provided with a right arm sling     for comfort measures and instructed to ice the extremity as needed. 4. Anemia.  On presentation, the patient's hemoglobin was noted to     continually down trend to a nadir of 6.6.  The patient was typed     and crossed for 2 units of packed red blood cells, and she had good     response to transfusion.  She maintained her hemoglobin after     discontinuation of heparin and at the time of discharge, this level     was 9.0.  It is also likely she has a component of iron deficiency     anemia contributing to this anemia, and she may be a candidate     for oral iron replacement as in the long-term. 5. Cardiology.  The patient was noted on the day prior to admission to     have an EKG showing atrial fibrillation.  On the     day of admission, her EKG showed normal sinus rhythm, and her     cardiac enzymes were negative.  The patient was monitored on     telemetry and found to exhibit intermittent atrial fibrillation and     this is likely a new diagnosis as the patient's daughter had not     been aware of this previously.  Given her history of right arm     hematoma and falls, we did not elect to start anticoagulation.  The     patient never exhibited tachycardia and her heart rate ranged from     70s to 80s throughout her stay.  She will continue on her     carvedilol, and this may need to be titrated as an outpatient. Her TSH was within normal limits.  DISPOSITION:  Given the patient's  deconditioning, dementia, and delirium, we  obtained PT, OT, and Social Work consult for potential skilled nursing placement.  Initially, the family agreed to pursuit this, however, ultimately desired to take the patient home.  There is adequate family support and assistance with health care and activities of daily living.  Home health PT and RN were ordered prior to discharge.  This will likely be an ongoing issues in the near future as care taking is likely to become more involved.  DISCHARGE INSTRUCTIONS:  The patient was discharged to home with her family.  They will call clinic or return to emergency department for increased agitation, combativeness, decreased oral intake and urine output.  DISCHARGED APPOINTMENTS: 1. Dr. Denny Levy, [facility] on October 13, 2010, at     10:00 a.m. 2. The patient was discharged to home in stable medical condition.    ______________________________ Lloyd Huger, MD   ______________________________ Paula Compton, MD    JK/MEDQ  D:  10/04/2010  T:  10/05/2010  Job:  [id number]  Electronically Signed by Lloyd Huger MD on 10/09/2010 03:41:03 PM Electronically Signed by Paula Compton MD on 10/25/2010 11:14:32 AM

## 2010-11-23 NOTE — H&P (Signed)
Kathleen [name], Kathleen [name]            ACCOUNT NO.:  [id number]   MEDICAL RECORD NO.:  [id number]          PATIENT TYPE:  OBV   LOCATION:  5727                         FACILITY:  [facility]   PHYSICIAN:  Barnetta Chapel, MDDATE OF BIRTH:  [birth year]   DATE OF ADMISSION:  03/09/2007  DATE OF DISCHARGE:                              HISTORY & PHYSICAL   CHIEF COMPLAINT:  Abdominal pain, nausea and vomiting.   HISTORY OF PRESENT ILLNESS:  The patient is an 77 year old African  American female with past medical history of mild CHF, hypertension and  chronic pain, on narcotics, who is normally cared for at home, but for  the past several days has been notably weaker and today she started  having abdominal pain, nausea and vomiting.  She has had previous  episodes of constipation, needing medical intervention.  When she came  into the emergency room, she had labs checked; she was found to have a  normal white count with no shift.  Her hemoglobin was stable, as were  her liver function tests and lipase level and urinalysis; however, her  sodium was low at 115 and creatinine slightly elevated at 1.4.  I  ordered an abdominal series x-ray, which is pending.  Her blood pressure  and heart rate were stabilized well.  The patient looks to be quite  fatigued and is not able to give me much of a history and this history  is obtained from her daughter.  I am not able to get a review of systems  from the patient.   PAST MEDICAL HISTORY:  1. Hypertension.  2. Urinary incontinence.  3. Osteoporosis.  4. Mild congestive heart failure.   MEDICATIONS:  1. Metoprolol 25 mg daily.  2. Norvasc.  3. Coreg.  4. Detrol.  5. Fosamax.  6. Darvocet.   ALLERGIES:  She has no known drug allergies.   SOCIAL HISTORY:  There is no tobacco, alcohol or drug use.  She lives at  home with her granddaughter, who cares for her.   FAMILY HISTORY:  Noncontributory.   PHYSICAL EXAMINATION:  VITALS ON ADMISSION:   Temperature 97.2, heart  rate 72, blood pressure 131/84, respirations 20, O2 SAT 98% on room air.  GENERAL:  She is a drowsy, oriented at best x1.  HEENT:  Normocephalic, atraumatic.  Her mucous membranes are dry.  She  has no carotid bruits.  HEART:  Regular rate and rhythm.  S1 and S2.  A 2/6 systolic ejection  murmur.  LUNGS:  Clear to auscultation bilaterally.  ABDOMEN:  Soft and slightly distended and nontender, but scant bowel  sounds.  EXTREMITIES:  No clubbing or cyanosis.  Trace pitting edema.   LABORATORY WORK:  White count 5.7, no shift, H&H 10.2 and 30, MCV of 92,  platelet count 220,000.  The LFTs are unremarkable.  Lipase 33.  UA:  Trace leukocyte esterase, 3-6 white cells, rare bacteria.  Sodium 115,  potassium 3.8, chloride 82, bicarb 26, BUN 15, creatinine 1.4, glucose  117.   ASSESSMENT AND PLAN:  1. Nausea and vomiting and dehydration.  Likely, I suspect the patient  is significantly constipated.  Will treat by gently hydrating, make      the patient nothing-by-mouth, put on intravenous Protonix and      nausea medications.  2. Suspected constipation.  Await abdominal x-ray report, but will      start her on Colace and go with laxatives to help clean her out.  3. Hyponatremia secondary to dehydration.  We will gently hydrate and      continue to follow.  4. Hypertension.  Currently hold her oral medications until she is      able to take orals.      Hollice Espy, M.D.  Electronically Signed      Barnetta Chapel, MD  Electronically Signed    SKK/MEDQ  D:  03/09/2007  T:  03/09/2007  Job:  [id number]

## 2010-11-23 NOTE — Discharge Summary (Signed)
[name], [name]            ACCOUNT NO.:  [id number]   MEDICAL RECORD NO.:  [id number]          PATIENT TYPE:  OBV   LOCATION:  5727                         FACILITY:  [facility]   PHYSICIAN:  Barnetta Chapel, MDDATE OF BIRTH:  10/16/1922   DATE OF ADMISSION:  03/08/2007  DATE OF DISCHARGE:  03/10/2007                               DISCHARGE SUMMARY   DISCHARGE DIAGNOSES:  1. Nausea and vomiting, resolved.  2. Abdominal pain, resolved.  3. Suspect viral gastroenteritis.  4. Dehydration, resolved.  5. Hypokalemia.  6. Hyponatremia, resolved.   DISCHARGE MEDICATIONS:  Include  1. Norvasc 10 mg orally once daily.  2. Coreg 3.125 orally twice daily.  3. Detrol LA 4 mg orally once daily.  4. Fosamax 70 mg orally once weekly.  5. Darvocet N-100 p.r.n.   CONSULTATIONS:  None.   IMAGING STUDIES:  Include x-ray of the abdomen that was normal.   PERTINENT LABS ON ADMISSION:  Sodium was 115, creatinine was 1.4. On the  day of discharge the sodium was 134, the creatinine was 1.19.   BRIEF HISTORY AND HOSPITAL COURSE:  The patient is an 77 year old female  with past medical history significant for hypertension, osteoporosis,  mild congestive heart failure and urinary incontinence. The patient  presented with nausea, vomiting, abdominal pain and diarrhea. On  admission the patient was dehydrated. The sodium was 115 and the  creatinine was 1.4.   The patient was admitted for observation. The patient was adequately  rehydrated. The hyponatremia has resolved. The creatinine is also down  to 1.19. The nausea and vomiting have also resolved. The abdominal pain  and the diarrhea have also resolved. The patient is back to her  baseline. She will be discharged back today to the care of the primary  care Kathleen [name], Dr. Concha [name].   DISCHARGE PLAN:  1. Discharge the patient to home.  2. Follow up with the primary care Kathleen [name] in a week.  3. Cardiac diet.  4. Activity as  tolerated.      Barnetta Chapel, MD  Electronically Signed     SIO/MEDQ  D:  03/10/2007  T:  03/10/2007  Job:  [id number]   cc:   Kathleen [name], Dr.

## 2010-11-26 ENCOUNTER — Other Ambulatory Visit (INDEPENDENT_AMBULATORY_CARE_PROVIDER_SITE_OTHER): Payer: PRIVATE HEALTH INSURANCE | Admitting: Family Medicine

## 2010-11-26 DIAGNOSIS — I10 Essential (primary) hypertension: Secondary | ICD-10-CM

## 2010-11-26 MED ORDER — CARVEDILOL 3.125 MG PO TABS: 3.1250 mg | ORAL_TABLET | Freq: Two times a day (BID) | ORAL | Status: DC

## 2010-11-26 MED ORDER — CALCIUM CARBONATE-VITAMIN D 500-200 MG-UNIT PO TABS: 2.0000 | ORAL_TABLET | Freq: Every day | ORAL | Status: DC

## 2010-11-26 MED ORDER — SENIOR MULTIVITAMIN PLUS PO TABS: 1.0000 | ORAL_TABLET | ORAL | Status: DC

## 2010-11-26 NOTE — Consult Note (Signed)
Kathleen [name], SAFLEY            ACCOUNT NO.:  [id number]   MEDICAL RECORD NO.:  [id number]          PATIENT TYPE:  INP   LOCATION:  1415                         FACILITY:  [facility]   PHYSICIAN:  Everardo Beals. Juanda Chance, MD, FACCDATE OF BIRTH:  September 25, 1922   DATE OF CONSULTATION:  09/22/2006  DATE OF DISCHARGE:                                 CONSULTATION   REFERRING PHYSICIAN:  Crecencio Mc, M.D.   REASON FOR CONSULTATION:  Evaluation of tachycardia.   CLINICAL HISTORY:  Ms. Kathleen [name] is 77 years old and was found to have a  renal mass on an incidental examination and underwent a right radical  nephrectomy several days ago.  She has had persistent tachycardia and we  were asked to see her in consultation for evaluation regarding this.  She has no prior history of known heart disease, although she does have  a history of hypertension.  She has had no recent chest pain or  shortness of breath.   PAST MEDICAL HISTORY:  1. Hypertension.  2. Possible remote stroke.  3. History of anemia and osteoarthritis.   HOME MEDICATIONS:  1. Norvasc 10 mg daily.  2. Detrol 4 mg daily.  3. Coreg 100 mg daily.   SOCIAL HISTORY:  She lives in [place] with her grandchildren.  She  smoked in the remote past but not in 36 years.  She uses a walker to  ambulate.   FAMILY HISTORY:  Negative for known heart disease.  Her father died from  alcohol problems.  Her mother died from unknown reasons.   REVIEW OF SYSTEMS:  Positive for urinary frequency.   PHYSICAL EXAMINATION:  VITAL SIGNS:  The blood pressure is 137/70, and  the pulse 100 and regular.  Temperature was 96.5.  VASCULAR:  There was no venous distention.  The carotid pulses were full  and I could hear no bruits.  CHEST:  Clear laterally with no rales or rhonchi.  CARDIAC:  Rhythm was regular.  Heart sounds were normal.  No definite  murmur.  ABDOMEN:  Flat with normal bowel sounds.  There was a surgical scar on  the right flank.  EXTREMITIES:  Pedal pulses were equal and there is no peripheral edema.  MUSCULOSKELETAL:  Showed no deformity.  SKIN:  Warm and dry.  NEUROLOGIC:  Showed no focal neurological signs.   The electrocardiogram showed sinus tachycardia and minor nonspecific ST-  T changes.  Her troponin was negative x3.  Her BUN was 9 and creatinine  was 1.4.  Her hemoglobin was 8.4.   IMPRESSION:  1. Sinus tachycardia.  2. Postoperative right radial nephrectomy.  3. Anemia with hemoglobin 8.4.  4. Hypertension.   RECOMMENDATIONS:  I suspect that the sinus tachycardia is multifactorial  related to her anemia, pain, and possibly some persistent mild volume  depletion.  She is currently taking fluids well and I think we can  continue this.  She has an echocardiogram which is pending.  We will  plan to repeat the CBC in the morning and when we have that result and  the results of her echocardiogram, we can decide if we  should consider  transfusion or if it is reasonable to plan discharge.      Bruce Elvera Lennox Juanda Chance, MD, [facility]  Electronically Signed     BRB/MEDQ  D:  09/22/2006  T:  09/22/2006  Job:  [id number]   cc:   Crecencio Mc, M.D.  Fax: [phone]

## 2010-11-26 NOTE — Discharge Summary (Signed)
[name], Kathleen [name]            ACCOUNT NO.:  [id number]   MEDICAL RECORD NO.:  [id number]          PATIENT TYPE:  INP   LOCATION:  1415                         FACILITY:  [facility]   PHYSICIAN:  Heloise Purpura, MD      DATE OF BIRTH:  Feb 11, 1923   DATE OF ADMISSION:  09/18/2006  DATE OF DISCHARGE:  09/24/2006                               DISCHARGE SUMMARY   ADMISSION DIAGNOSIS:  Right renal mass.   DISCHARGE DIAGNOSIS:  Right renal mass.   PROCEDURE:  Right laparoscopic radical nephrectomy.   HISTORY:  Ms. [name] as an 77 year old female who was incidentally  found to have a 4.5-5 cm enhancing right renal mass worrisome for  malignancy.  After discussing options with the patient, she elected to  proceed with a laparoscopic radical nephrectomy.   HOSPITAL COURSE:  On September 18, 2006, the patient was taken to the  operating room and underwent a right laparoscopic radical nephrectomy.  She tolerated this procedure well without complications.  Postoperatively, she was able to be transferred to a regular hospital  floor.  Over the course of the next few days, she was able to begin  ambulating with the aid of a walker.  Her diet was gradually advanced  following return of bowel function.  She was subsequently able to be  transitioned to oral pain medication.  On postoperative day #1, the  patient was noted to have significantly decreased urine output and serum  creatinine that was slightly rising.  She did have a preoperative  renogram which demonstrated symmetrical renal function.  She was  administered IV fluid hydration and subsequently her urine output  increased.  Her serum creatinine continued to decrease.  This was most  consistent with acute tubular necrosis likely secondary to effects from  the pneumoperitoneum during surgery.  Once the patient's urine output  had increased sufficiently and her ATN had significantly resolved, her  Foley catheter was removed.  On  postoperative day #4, the patient was  noted to be somewhat tachycardic and an electrocardiogram was obtained  which demonstrated premature supraventricular beats.  The patient's  electrolytes were replaced and she was monitored on telemetry.  By  postoperative day 4 this continued.  Her hemoglobin was found to drift  down from approximately 9 which had been stable postoperatively to 8.4.  This subsequently decreased to 7.9 on postoperative day #5 and she did  receive 2 units of packed red blood cells.  In addition, a cardiology  consultation was obtained.  The patient was felt to most likely have  sinus tachycardia without other significant abnormalities.  Her  tachycardia resolved following blood transfusion.  She was able to be  discharged home on postoperative day #6 in stable condition.   DISPOSITION:  Home.   DISCHARGE MEDICATIONS:  Ms. Rencher was instructed to resume her  regular home medications.  In addition, she was given Darvocet to take  as needed for pain and told to use Colace as a stool softener.  She was  also begun on beta blocker therapy per the cardiology service and began  metoprolol.   DISCHARGE INSTRUCTIONS:  The patient was instructed to a increase her  activity as tolerated.  She was told that she may have a regular diet.   FOLLOW-UP:  She will follow-up in approximately 1 week for removal of  her staples and to discuss her surgical pathology in detail.           ______________________________  Heloise Purpura, MD  Electronically Signed     LB/MEDQ  D:  09/24/2006  T:  09/25/2006  Job:  [id number]

## 2010-11-26 NOTE — H&P (Signed)
NAMERUTHIA, PERSON            ACCOUNT NO.:  [id number]   MEDICAL RECORD NO.:  [id number]          PATIENT TYPE:  INP   LOCATION:  NA                           FACILITY:  [facility]   PHYSICIAN:  Kathleen Purpura, MD      DATE OF BIRTH:  1922/08/26   DATE OF ADMISSION:  DATE OF DISCHARGE:                              HISTORY & PHYSICAL   CHIEF COMPLAINT:  Right renal mass.   HISTORY:  Kathleen [name] is in an 77 year old female who was found to  have an incidentally detected right renal mass measuring 4.5 cm.  This  mass was noted to be enhancing with IV contrast; and at ad the upper  pole of the right kidney.  She underwent a metastatic evaluation  including chest imaging and routine laboratory work which was negative  for metastatic disease.  After undergoing an extensive discussion  regarding management options, the patient elected to proceed with a  right radical nephrectomy.  After discussing options on the approach,  the patient did elect to proceed with a laparoscopic approach.  She did  undergo a preoperative renogram demonstrating symmetrical relative renal  function.  She also received preoperative clearance from her primary  care physician, Dr. Renae Gloss.   PAST MEDICAL HISTORY:  1. Hypertension.  2. Osteoarthritis.   PAST SURGICAL HISTORY:  None.   MEDICATIONS:  1. Detrol.  2. Fosamax.  3. Amlodipine  4. Naproxen.  5. Skelaxin.  6. Calcium.   ALLERGIES:  No known drug allergies.   FAMILY HISTORY:  There is a history of hypertension and arthritis in the  patient's sister.  There is no history of GU malignancy.   SOCIAL HISTORY:  The patient is retired.  She is divorced.  She did  smoke in the past but quit 36 years ago.  She denies alcohol use.   REVIEW OF SYSTEMS:  Review of systems does include weight loss, fatigue,  sinus problems, back pain, joint pain, abdominal pain, constipation,  headaches, dizziness, depression, and anxiety.  Of note, her back and  joint  pain is chronic and unchanged over the past few years.   PHYSICAL EXAM:  CONSTITUTIONAL:  Alert and oriented in no acute  distress.  CARDIOVASCULAR:  Regular rate and rhythm.  LYMPH NODE SURVEY:  Negative.  LUNGS:  Clear bilaterally.  ABDOMEN:  Soft, nontender, nondistended without abdominal masses  EXTREMITIES:  No edema.   IMPRESSION:  Right renal mass worrisome for malignancy.   PLAN:  Kathleen [name] will undergo a right laparoscopic radical  nephrectomy; and then be admitted to the hospital for routine  postoperative care.           ______________________________  Kathleen Purpura, MD  Electronically Signed     LB/MEDQ  D:  09/18/2006  T:  09/18/2006  Job:  [id number]

## 2010-11-26 NOTE — Op Note (Signed)
NAMEALEEN, MARSTON            ACCOUNT NO.:  [id number]   MEDICAL RECORD NO.:  [id number]        PATIENT TYPE:  LINP   LOCATION:                               FACILITY:  [facility]   PHYSICIAN:  Heloise Purpura, MD      DATE OF BIRTH:  1922/08/05   DATE OF PROCEDURE:  09/18/2006  DATE OF DISCHARGE:                               OPERATIVE REPORT   PREOPERATIVE DIAGNOSIS:  Right renal tumor.   POSTOPERATIVE DIAGNOSIS:  Right renal tumor.   PROCEDURES PERFORMED:  Right laparoscopic radical nephrectomy.   SURGEON:  Crecencio Mc, M.D.   ASSISTANT:  Cornelious Bryant, MD   ANESTHESIA:  General.   SPECIMEN:  Right kidney with tumor.   DISPOSITION:  Pathologic specimen for permanent.   ESTIMATED BLOOD LOSS:  250 mL.   COMPLICATIONS:  None.   INDICATIONS:  This is an 77 year old African-American lady who underwent  CT scan for right sided back pain.  CT scan revealed a 5-cm enhancing  right renal mass suspicious for renal cell carcinoma.  The patient  denies any fevers or night sweats.  She had an approximately 20 pound  weight loss over the past few months.  She denied any hematuria or  sensation of abdominal mass.  There was no associated lymphadenopathy,  no evidence of renal involvement.  The contralateral kidney looked  normal and a metastatic evaluation was negative.  After extensive  counseling, the patient elected for a laparoscopic right radical  nephrectomy. Informed consent was obtained.   DESCRIPTION OF THE PROCEDURE:  The patient was brought to the operating  room.  Preoperative antibiotics were given.  General anesthesia was  induced.  Bilateral STDs were applied to prevent DVTs.  The patient was  placed in the modified right flank position with the right side up.  She  was taped in place; she was then prepped and draped in the normal  sterile fashion.  A time-out was taken to properly identify the patient  and procedure going to be done.  At the beginning of the case a  Foley  catheter was inserted by the nursing staff.  Clear urine was efluxing.  A 1.5 cm skin incision was then made just superior to the umbilicus.  The abdominal cavity was then entered under direct vision using the  [facility] technique.  There was no evidence of any peritoneal  carcinomatosis or extensive adhesions.  About a handbreadth cephalad to  the camera port, a 5-mm port was then inserted.  Another, 12 mm port was  inserted about a handbreadth lateral to the camera port, and slightly  caudal.   At the level of the right flank just inferior to the thoracic cage, in  the extreme lateral position a 5-mm port was also inserted.  This will  be used to retract the liver cephalad during the case.  Examination of  the abdomen did reveal a large right tumor that seemed to be pushing the  kidney medially.  The peritoneal reflection of the kidney was tented up;  and incised with the Harmonic scalpel.  The ascending colon was then  swept medially.  The peritoneal reflection was then taken down caudally  to the level of the pelvic brim.  Following reflection of the colon  medially.  The duodenum and the inferior vena cava were easily  identified. The duodenum was relected medially. The medial border of the  kidney was then traced, inferiorly to below the inferior edge of the  right kidney.  The plane between the lower pole of the kidney and the  rest of the mesocolon was developed by blunt dissection until the right  ureter was identified.   Dissection was carried out until the psoas muscle was encountered.  At  this point, the right ureter was then tented up, thus pulling the kidney  laterally; and putting the kidney hilum on mild stretch.  The renal  hilum was then slowly dissected out using the harmonic and the prestige.  The right inferior renal vein was initially identified with an  accompanying, right inferior pole renal artery.  The artery and vein  were doubly clipped and cut.   Dissection was then carried cephalad  slowly, along the medial aspect of the kidney and the renal hilum.  A  large branching renal vein was identified.  Along the superior and  inferior edge of the right renal vein.  There were too smaller renal  arteries.  These arteries were then individually doubly clipped and cut;  the large vein was also doubly clipped and cut.  The rest of the medial  dissection was then carried out until the right adrenal gland was  reached.   At this point, dissection proceeded laterally to free the superior edge  of the right kidney.  A sliver of the right adrenal gland was clipped  right at the edge to provide an adequate margin around the superior pole  of the kidney due to the upper pole location of the renal mass, and the  harmonic was used to dissect the connective tissue between the kidney  and the adrenal gland.  The superior dissection was carried out until  the lateral wall was reached.  At this point, the posterior dissection  was completed followed by the lateral attachment of the kidney to the  peritoneal reflection.  At this point the kidney was flipped over the  liver and the rest of the connective tissue attachment was taken down.  Surgicel was then applied to the adrenal bed.  A suture passer was then  used to pass a #0 Vicryl into the 12-mm port that was in the lateral  position.   The EndoCatch II bag was then inserted through the was camera port site  and using the lateral 12-mm port and the prestige, the specimen was then  inserted into the EndoCatch bag.  The two 5-mm ports were taken out.  There was no evidence of any bleeding.  The site where the umbilical 12-  mm port was present was extended around the umbilicus to about 2 cm  below the umbilicus.  The EndoCatch bag was then taken out with the  kidney inside and sent for pathological evaluation.  The stitch that was inserted into the lateral 12-mm port was then cinched down; and the   peritoneum was closed.  The mid abdominal wound fascia was then  approximated using Vicryl in a running fashion.  Lidocaine was then  inserted into the wound; and the wound was then stapled.   At this point the procedure terminated with no complications.  The  patient was then extubated in the operating  room; and taken in stable  condition to the PACU.  Please note that Dr. Laverle Patter was present and  participated in the entire procedure as he was the responsible surgeon.     ______________________________  Cornelious Bryant, MD      Heloise Purpura, MD  Electronically Signed    SK/MEDQ  D:  09/18/2006  T:  09/18/2006  Job:  [id number]

## 2010-12-09 ENCOUNTER — Ambulatory Visit (INDEPENDENT_AMBULATORY_CARE_PROVIDER_SITE_OTHER): Payer: Medicare Other | Admitting: Family Medicine

## 2010-12-09 VITALS — BP 142/59 | HR 69 | Temp 97.0°F | Wt 113.0 lb

## 2010-12-09 DIAGNOSIS — F0390 Unspecified dementia without behavioral disturbance: Secondary | ICD-10-CM

## 2010-12-09 DIAGNOSIS — W19XXXA Unspecified fall, initial encounter: Secondary | ICD-10-CM

## 2010-12-09 DIAGNOSIS — M7989 Other specified soft tissue disorders: Secondary | ICD-10-CM

## 2010-12-09 MED ORDER — HALOPERIDOL 0.5 MG PO TABS: ORAL_TABLET | ORAL | Status: DC

## 2010-12-09 NOTE — Progress Notes (Signed)
  Subjective:    Patient ID: Kathleen [name], female    DOB: [birth year], 77 y.o.   MRN: [id number]  HPI Right arm swollen again Evidently fell sometime over the weekend--family noted arm was swollen a day or so ago and now it is bruising. 2) also issues wirth her not sleeping at night--gets up and does a little wandering. No behavioral issues  3)Refuses to always use her walker or use it correctly and family wants me to discuss with her.   Review of Systems    Pertinent review of systems: negative for fever or unusual weight change.  Objective:   Physical Exam    GEN WD WN NAD sitting peacefully in wheelchair NEURO: Alert. Oriented to person and place. Smiles and answers simple questions appropriately.  MSK: Right upper extremity is swollen all the way up to the shoulder and down to fingers, but most of swelling is in bicep area. Some bruising noted anterior upper arm---mild.  Can raise ar, pronate without any pain other than some increased tightness from swelling. Nontender to palpation around elbow, shoulder and wrist. Grips strength in hand is symmetrical with that of left hand. VASC: pulses dsitally in wrist are intact as is cap refill.   [place] of right UE revealed good pulses in main arteries of upper arm and at wrist, a lot of apparent hematoma. I see no obvious anurysm on the artery tree although limited exam.     Assessment & Plan:  1) fall with new hematoma of right arm. Preexisting injury of that arm and rotator cuff. I suspect she started to fall at some point and reached out and grabbed something for stability. She seems vascularly intact and without any concern for compartment syndrome. We discussed elevation and tid ice for ten minutes a time. 2) wandering at night--perhaps contributing to her falls. Will try very low dose haldol and family will f/u w me 3) I did discuss appropriate use of walker---given her level of dementia I doubt this will make a significant difference  in her behaviour.

## 2010-12-27 ENCOUNTER — Encounter: Payer: Self-pay | Admitting: Family Medicine

## 2011-04-22 LAB — BASIC METABOLIC PANEL
BUN: 11
BUN: 8
CO2: 24
CO2: 26
Calcium: 8.2 — ABNORMAL LOW
Calcium: 8.5
Chloride: 104
Chloride: 96
Creatinine, Ser: 1.19
Creatinine, Ser: 1.27 — ABNORMAL HIGH
GFR calc Af Amer: 49 — ABNORMAL LOW
GFR calc Af Amer: 53 — ABNORMAL LOW
GFR calc non Af Amer: 40 — ABNORMAL LOW
GFR calc non Af Amer: 44 — ABNORMAL LOW
Glucose, Bld: 71
Glucose, Bld: 94
Potassium: 3.2 — ABNORMAL LOW
Potassium: 3.4 — ABNORMAL LOW
Sodium: 125 — ABNORMAL LOW
Sodium: 134 — ABNORMAL LOW

## 2011-04-22 LAB — I-STAT 8, (EC8 V) (CONVERTED LAB)
Acid-Base Excess: 2
BUN: 15
Bicarbonate: 25.6 — ABNORMAL HIGH
HCT: 31 — ABNORMAL LOW
Hemoglobin: 10.5 — ABNORMAL LOW
Operator id: 270651
Sodium: 115 — CL
TCO2: 27

## 2011-04-22 LAB — HEPATIC FUNCTION PANEL
AST: 37
Bilirubin, Direct: 0.2

## 2011-04-22 LAB — DIFFERENTIAL
Basophils Absolute: 0
Basophils Relative: 0
Eosinophils Absolute: 0
Monocytes Absolute: 0.4
Neutro Abs: 4.2
Neutrophils Relative %: 73

## 2011-04-22 LAB — CBC
Hemoglobin: 10.2 — ABNORMAL LOW
MCHC: 34.3
RDW: 12

## 2011-04-22 LAB — URINE CULTURE
Colony Count: NO GROWTH
Culture: NO GROWTH

## 2011-04-22 LAB — URINALYSIS, ROUTINE W REFLEX MICROSCOPIC
Nitrite: NEGATIVE
Specific Gravity, Urine: 1.008
pH: 8

## 2011-04-22 LAB — OSMOLALITY, URINE: Osmolality, Ur: 75 — ABNORMAL LOW

## 2011-04-22 LAB — URINE MICROSCOPIC-ADD ON

## 2011-04-22 LAB — LIPASE, BLOOD: Lipase: 33

## 2011-04-22 LAB — SODIUM, URINE, RANDOM: Sodium, Ur: 11

## 2011-04-22 LAB — OSMOLALITY: Osmolality: 258 — ABNORMAL LOW

## 2011-05-28 ENCOUNTER — Emergency Department (HOSPITAL_COMMUNITY)
Admission: EM | Admit: 2011-05-28 | Discharge: 2011-05-28 | Disposition: A | Discharge status: Home / Self Care | Payer: Medicare Other | Attending: Emergency Medicine | Admitting: Emergency Medicine

## 2011-05-28 ENCOUNTER — Emergency Department (HOSPITAL_COMMUNITY): Payer: Medicare Other

## 2011-05-28 ENCOUNTER — Encounter: Payer: Self-pay | Admitting: *Deleted

## 2011-05-28 DIAGNOSIS — M25511 Pain in right shoulder: Secondary | ICD-10-CM

## 2011-05-28 DIAGNOSIS — M1990 Unspecified osteoarthritis, unspecified site: Secondary | ICD-10-CM | POA: Insufficient documentation

## 2011-05-28 DIAGNOSIS — W010XXA Fall on same level from slipping, tripping and stumbling without subsequent striking against object, initial encounter: Secondary | ICD-10-CM | POA: Insufficient documentation

## 2011-05-28 DIAGNOSIS — M79643 Pain in unspecified hand: Secondary | ICD-10-CM

## 2011-05-28 DIAGNOSIS — M79609 Pain in unspecified limb: Secondary | ICD-10-CM | POA: Insufficient documentation

## 2011-05-28 DIAGNOSIS — Z7982 Long term (current) use of aspirin: Secondary | ICD-10-CM | POA: Insufficient documentation

## 2011-05-28 DIAGNOSIS — M7989 Other specified soft tissue disorders: Secondary | ICD-10-CM | POA: Insufficient documentation

## 2011-05-28 DIAGNOSIS — Z79899 Other long term (current) drug therapy: Secondary | ICD-10-CM | POA: Insufficient documentation

## 2011-05-28 DIAGNOSIS — M25519 Pain in unspecified shoulder: Secondary | ICD-10-CM | POA: Insufficient documentation

## 2011-05-28 DIAGNOSIS — IMO0001 Reserved for inherently not codable concepts without codable children: Secondary | ICD-10-CM | POA: Insufficient documentation

## 2011-05-28 DIAGNOSIS — M129 Arthropathy, unspecified: Secondary | ICD-10-CM | POA: Insufficient documentation

## 2011-05-28 DIAGNOSIS — M25449 Effusion, unspecified hand: Secondary | ICD-10-CM | POA: Insufficient documentation

## 2011-05-28 DIAGNOSIS — M259 Joint disorder, unspecified: Secondary | ICD-10-CM | POA: Insufficient documentation

## 2011-05-28 DIAGNOSIS — I10 Essential (primary) hypertension: Secondary | ICD-10-CM | POA: Insufficient documentation

## 2011-05-28 DIAGNOSIS — Y92009 Unspecified place in unspecified non-institutional (private) residence as the place of occurrence of the external cause: Secondary | ICD-10-CM | POA: Insufficient documentation

## 2011-05-28 DIAGNOSIS — W19XXXA Unspecified fall, initial encounter: Secondary | ICD-10-CM

## 2011-05-28 HISTORY — DX: Unspecified osteoarthritis, unspecified site: M19.90

## 2011-05-28 HISTORY — DX: Spinal stenosis, site unspecified: M48.00

## 2011-05-28 HISTORY — DX: Essential (primary) hypertension: I10

## 2011-05-28 MED ORDER — TRAMADOL-ACETAMINOPHEN 37.5-325 MG PO TABS: 1.0000 | ORAL_TABLET | Freq: Four times a day (QID) | ORAL | Status: AC | PRN

## 2011-05-28 NOTE — ED Notes (Signed)
Patient fell this afternoon and now her right hand and her right upper arm, her right thigh.  Patient tripped and fell

## 2011-05-28 NOTE — ED Notes (Signed)
PT c/o fall. Pain in right arm, shoulder. Edema in rt hand and rt elbow. Full range of motion.

## 2011-05-28 NOTE — ED Provider Notes (Signed)
History     CSN: [id number] Arrival date & time: 05/28/2011  8:17 PM   First MD Initiated Contact with Patient 05/28/11 2218      Chief Complaint  Patient presents with  . Fall    (Consider location/radiation/quality/duration/timing/severity/associated sxs/prior treatment) Patient is a 77 y.o. female presenting with fall. The history is provided by the patient and a relative. History Limited By: poor historian.  Fall The accident occurred 6 to 12 hours ago. The fall occurred while standing. She landed on carpet. There was no blood loss. The point of impact was the right shoulder. The pain is present in the right shoulder (right hand). The pain is moderate. She was ambulatory at the scene. Pertinent negatives include no visual change, no numbness, no abdominal pain, no nausea, no vomiting, no hematuria, no headaches and no tingling. The symptoms are aggravated by use of the injured limb and activity.  Pt lives at home with her daughter. She was in her room and had her arms full with laundry when she tripped and fell, striking her R shoulder and hand. She is now having some swelling in her R hand, but has full use of the hand. She did not have antecedent or post-fall syncope. Denies CP, SOB, abd pain. Daughter states she has had some frequent falls; normally ambulates with a walker but does not always use it like she should.   Past Medical History  Diagnosis Date  . Hypertension   . Arthritis   . Spinal stenosis   . Osteoarthritis     History reviewed. No pertinent past surgical history.  No family history on file.  History  Substance Use Topics  . Smoking status: Never Smoker   . Smokeless tobacco: Not on file  . Alcohol Use: No    OB History    Grav Para Term Preterm Abortions TAB SAB Ect Mult Living                  Review of Systems  Constitutional: Negative.   HENT: Negative for facial swelling, neck pain, neck stiffness and tinnitus.   Eyes: Negative for visual  disturbance.  Respiratory: Negative for cough and shortness of breath.   Cardiovascular: Negative for chest pain and palpitations.  Gastrointestinal: Negative for nausea, vomiting and abdominal pain.  Genitourinary: Negative for hematuria.  Musculoskeletal: Positive for myalgias and joint swelling.  Skin: Negative for color change and wound.  Neurological: Negative for dizziness, tingling, syncope, facial asymmetry, weakness, light-headedness, numbness and headaches.  Psychiatric/Behavioral: Negative for behavioral problems.    Allergies  Review of patient's allergies indicates no known allergies.  Home Medications   Current Outpatient Rx  Name Route Sig Dispense Refill  . ASPIRIN 81 MG PO TABS Oral Take 81 mg by mouth daily.      Marland Kitchen CALCIUM CARBONATE-VITAMIN D 500-200 MG-UNIT PO TABS Oral Take 2 tablets by mouth daily. 190 tablet 3  . CARVEDILOL 3.125 MG PO TABS Oral Take 1 tablet (3.125 mg total) by mouth 2 (two) times daily with a meal. 180 tablet 3  . SENIOR MULTIVITAMIN PLUS PO TABS Oral Take 1 tablet by mouth 1 dose over 24 hours. 100 tablet 12    BP 168/76  Pulse 77  Resp 20  SpO2 97%  Physical Exam  Nursing note and vitals reviewed. Constitutional: She is oriented to person, place, and time. She appears well-developed and well-nourished. No distress.  HENT:  Head: Normocephalic and atraumatic.  Right Ear: External ear normal.  Left  Ear: External ear normal.  Mouth/Throat: Oropharynx is clear and moist.  Eyes: Conjunctivae and EOM are normal. Pupils are equal, round, and reactive to light.  Neck: Normal range of motion. Neck supple.  Cardiovascular: Normal rate, regular rhythm and normal heart sounds.   Pulmonary/Chest: Effort normal and breath sounds normal. She has no wheezes. She exhibits no tenderness.  Abdominal: Soft. Bowel sounds are normal. There is no tenderness. There is no guarding.  Musculoskeletal: Normal range of motion. She exhibits edema and tenderness.        R shoulder: crepitus with ROM, soft tissue tenderness; no palpable deformity, edema, erythema; ext neurovasc intact  R hand: swelling to dorsum; TTP; no erythema; FROM of fingers and wrist; good grip strength; cap refill <3  Spine: no palpable stepoff, crepitus, deformity  Neurological: She is alert and oriented to person, place, and time. No cranial nerve deficit.  Skin: Skin is warm and dry. No rash noted. She is not diaphoretic.  Psychiatric: She has a normal mood and affect.    ED Course  Procedures (including critical care time)  Labs Reviewed - No data to display Dg Shoulder Right  05/28/2011  *RADIOLOGY REPORT*  Clinical Data: Right shoulder pain status post fall.  RIGHT SHOULDER - 2+ VIEW  Comparison: 09/30/2010 radiograph  Findings: Advanced glenohumeral DJD. Contour deformity to the humeral head may reflect advanced degenerative changes or Hill- Sachs deformity if there is been a prior dislocation.  Currently, humeral head is seated within the glenoid.  There is a small calcific density along the lateral aspect of the humeral neck for which a small avulsion type injury is not excluded. Calcific density within the joint space may reflect a loose body or calcific tendonitis.  Acromioclavicular DJD with erosive/observer changes of the distal clavicle.  IMPRESSION: Advanced degenerative changes of the glenohumeral joint and acromioclavicular joint.  Contour deformity to the humeral head has progressed from March, may reflect advanced degenerative changes, AVN, or prior dislocation/Hill-Sachs deformity.  Small calcific density along the lateral aspect of the humeral neck may reflect an avulsion injury of indeterminate age.  Original Report Authenticated By: Waneta Martins, M.D.   Dg Hand Complete Right  05/28/2011  *RADIOLOGY REPORT*  Clinical Data: Right hand pain status post fall.  RIGHT HAND - COMPLETE 3+ VIEW  Comparison: None.  Findings: Advanced degenerative changes of the  PIP and DIP joints as well as the first carpal-metacarpal joint.  Diffuse osteopenia. The second and fifth DIP joints are fixed in the flexed position. There is soft tissue swelling along the ulnar and dorsal aspect of the hand.  No acute fracture or dislocation identified.  IMPRESSION: Advanced osteoarthritic versus erosive OA changes of the PIP, DIP, and first carpal metacarpal joints.  Soft tissue swelling along the dorsum of the hand.  No acute fracture or dislocation identified. If clinical concern for a fracture persists, recommend a repeat radiograph in 5-10 days to evaluate for interval change or callus formation.  Original Report Authenticated By: Waneta Martins, M.D.     1. Fall   2. Pain in hand   3. Pain in right shoulder       MDM  Films reviewed. No evidence of fx, though there is advanced OA/DJD. Notable crepitus on shoulder palpation. It does not sound as if the patient hit her head and she is not anticoagulated so head CT not necessary. She is able to ambulate. She was given information on fall precautions and a rx for Ultracet.  She was instructed to f/u with PCP for a recheck next week, and was educated re: warning s/sx that would prompt a return ED visit. She and daughter verbalized understanding and agreed to plan.        Grant Fontana, [place] 05/30/11 1427

## 2011-05-29 NOTE — ED Provider Notes (Signed)
Patient was seen and evaluated with Ms. Williams. She has accidental fall, when bending over. She has injury to right shoulder and right hand. No apparent fractures seen on evaluation, and patient is at her baseline according to her daughter, who is with her. She is stable for discharge.   Medical screening examination/treatment/procedure(s) were conducted as a shared visit with non-physician practitioner(s) and myself.  I personally evaluated the patient during the encounter  Flint Melter, MD 05/29/11 [phone]

## 2011-06-01 NOTE — ED Provider Notes (Signed)
Medical screening examination/treatment/procedure(s) were conducted as a shared visit with non-physician practitioner(s) and myself.  I personally evaluated the patient during the encounter.  Patient evaluated, she is alert and cooperative. She complains of pain in the right arm and shoulder. Has normal range of motion of right arm, shoulder. She has generalized arthritic findings consistent with progressive arthritis of undetermined etiology. Suspect mechanical fall. Patient is stable for discharge.  Flint Melter, MD 06/01/11 0830

## 2011-06-22 ENCOUNTER — Ambulatory Visit (INDEPENDENT_AMBULATORY_CARE_PROVIDER_SITE_OTHER): Payer: Medicare Other | Admitting: Family Medicine

## 2011-06-22 ENCOUNTER — Encounter: Payer: Self-pay | Admitting: Family Medicine

## 2011-06-22 VITALS — BP 168/81 | HR 84 | Wt 105.5 lb

## 2011-06-22 DIAGNOSIS — R350 Frequency of micturition: Secondary | ICD-10-CM

## 2011-06-22 LAB — POCT URINALYSIS DIPSTICK
Bilirubin, UA: NEGATIVE
Glucose, UA: NEGATIVE
Ketones, UA: NEGATIVE
Leukocytes, UA: NEGATIVE
Nitrite, UA: NEGATIVE

## 2011-06-22 LAB — POCT UA - MICROSCOPIC ONLY

## 2011-06-22 NOTE — Progress Notes (Signed)
  Subjective:    Patient ID: Kathleen [name], female    DOB: 11/26/1922, 77 y.o.   MRN: [id number]  HPI  Urinary frequency over last few days, no fever, no abdominal pain, no fevers. No burning ion urination  Also has questions about right hand swelling which has never gotten better since her fall where she tore her rotator cuff.  Here with Deena, her granddaughter. Otherwise she is doing well---still some stubborn behaviour where she walks without assistance at home or gets up and tries to take a bath by herself, but generally there are enough family members around to watch  Her pretty closely.  Review of Systems Pertinent review of systems: negative for fever or unusual weight change. Still eating and can partially feed herself w right hand.    Objective:   Physical Exam   Vital signs reviewed. ABDOMEN: Soft positive bowel sounds.  BLADDER: non tender over bladder area to palpation. MSK RIGHT HAND;obvious arthritic changes with synovitis most prominent on dorsum. Lacks full extension of fingers but can still grip fairly well. No redness, no warmth. Nontender to palpation. LEFT HAND: flexure contractions all 4 fingers at  Pip and dip.        Assessment & Plan:  Urinary frequency---UA essentially negative dipstick so I do not think bacterial infection. Will culture to be sure. Hand swelling---chronic synovitis---it is not painful---family was worried that the "swelling" never "went down". I explained that this is a chronic arthritic change. It does not seem to be painful. Will follow.

## 2011-06-23 LAB — URINE CULTURE: Colony Count: NO GROWTH

## 2011-09-29 ENCOUNTER — Ambulatory Visit (INDEPENDENT_AMBULATORY_CARE_PROVIDER_SITE_OTHER): Payer: Medicare Other | Admitting: Family Medicine

## 2011-09-29 VITALS — BP 181/92 | HR 74 | Temp 98.1°F | Ht 60.0 in | Wt 99.6 lb

## 2011-09-29 DIAGNOSIS — J209 Acute bronchitis, unspecified: Secondary | ICD-10-CM

## 2011-09-29 MED ORDER — DOXYCYCLINE HYCLATE 100 MG PO TABS: 100.0000 mg | ORAL_TABLET | Freq: Two times a day (BID) | ORAL | Status: AC

## 2011-09-29 NOTE — Progress Notes (Signed)
  Subjective:    Patient ID: Kathleen [name], female    DOB: 23-Oct-1922, 78 y.o.   MRN: [id number]  HPI Productive cough x7-10 days. Yellow-tan sputum. Also some nasal drainage but no headache. Denies fever. #2. She is a little concerned about her weight loss. Her granddaughter who is with her says she eats very sparingly. She does occasionally take him short period #3. dementia. No current issues.   Review of Systems    see hpi Objective:   Physical Exam  Vital signs reviewed. GENERAL: Well-developed, well-nourished, no acute distress. CARDIOVASCULAR: Regular rate and rhythm no murmur gallop or rub LUNGS: Scattered basilar crackles. No wheeze. ABDOMEN: Soft positive bowel sounds NEURO: No gross focal neurological deficits. MSK: arthritic changes hands, left hand very little opening or closing of fingers possible secondary to chronic contractures. Skin on palm is without maceration.        Assessment & Plan:  Bronchitis---doxycicline 2. Weight loss---discussed increasing ensure 3. Dementia--seems to be doing extremely well and appear to be excellently cared for.

## 2011-12-16 ENCOUNTER — Non-Acute Institutional Stay: Payer: Self-pay | Admitting: Family Medicine

## 2011-12-16 ENCOUNTER — Encounter: Payer: Self-pay | Admitting: Family Medicine

## 2011-12-16 DIAGNOSIS — F0390 Unspecified dementia without behavioral disturbance: Secondary | ICD-10-CM

## 2011-12-16 DIAGNOSIS — M159 Polyosteoarthritis, unspecified: Secondary | ICD-10-CM | POA: Insufficient documentation

## 2011-12-16 DIAGNOSIS — I10 Essential (primary) hypertension: Secondary | ICD-10-CM

## 2011-12-16 DIAGNOSIS — M810 Age-related osteoporosis without current pathological fracture: Secondary | ICD-10-CM

## 2011-12-16 DIAGNOSIS — Z789 Other specified health status: Secondary | ICD-10-CM | POA: Insufficient documentation

## 2011-12-16 DIAGNOSIS — I480 Paroxysmal atrial fibrillation: Secondary | ICD-10-CM

## 2011-12-16 DIAGNOSIS — I509 Heart failure, unspecified: Secondary | ICD-10-CM

## 2011-12-16 DIAGNOSIS — Z593 Problems related to living in residential institution: Secondary | ICD-10-CM | POA: Insufficient documentation

## 2011-12-16 HISTORY — DX: Polyosteoarthritis, unspecified: M15.9

## 2011-12-16 MED ORDER — ACETAMINOPHEN ER 650 MG PO TBCR: 650.0000 mg | EXTENDED_RELEASE_TABLET | Freq: Three times a day (TID) | ORAL | Status: AC

## 2011-12-16 NOTE — Progress Notes (Signed)
Patient ID: DARIONNA BANKE, female   DOB: Jan 01, 1923, 78 y.o.   MRN: [id number]  [facility] Admission History and Physical  Patient name: LACEE GREY Medical record number: [id number] Date of birth: [birth year] Age: 78 y.o. Gender: female  Primary Care Provider: Denny Levy, MD, MD  Chief Complaint: Failure to thrive History of Present Illness: VONCILLE SIMM is a 78 y.o. year old female presenting with history of moderate dementia, atrial fibrillation, hypertension, chronic kidney disease, osteoporosis and severe osteoarthritis been admitted to [facility] skilled nursing facility secondary to failure to thrive. Patient has been living on her own with significant help from family members but at this point can no longer take care of herself and do her regular activities of daily living. Patient though is comfortable and at this time no other complaints. Granddaughter at bedside who also has no questions at this time.  Patient Active Problem List  Diagnoses  . Dementia  . Paroxysmal atrial fibrillation  . Hypertension  . Kidney disease, chronic, stage III (GFR 30-59 ml/min)  . Osteoporosis  . CHF (congestive heart failure)  . Generalized OA   Past Medical History: Past Medical History  Diagnosis Date  . Hypertension   . Arthritis   . Spinal stenosis   . Osteoarthritis   . Anemia   . CHF (congestive heart failure)   . Heart murmur     aortic regurg  . Osteoporosis   . Chronic kidney disease   . Generalized OA 12/16/2011    Significant. Seems to have encompass all joints. Patient is complaining of significant pain but dementia allows patient to be happy.    Past Surgical History: History reviewed. No pertinent past surgical history.  Social History: History   Social History  . Marital Status: Single    Spouse Name: N/A    Number of Children: N/A  . Years of Education: N/A   Social History Main Topics  . Smoking status: Never Smoker   . Smokeless  tobacco: None  . Alcohol Use: No  . Drug Use: No  . Sexually Active: No   Other Topics Concern  . None   Social History Narrative  . None    Family History: Family History  Problem Relation Age of Onset  . Hypertension Daughter     Allergies: No Known Allergies  Current Outpatient Prescriptions  Medication Sig Dispense Refill  . aspirin 81 MG tablet Take 81 mg by mouth daily.        . calcium-vitamin D (OSCAL WITH D) 500-200 MG-UNIT per tablet Take 2 tablets by mouth daily.  190 tablet  3  . carvedilol (COREG) 3.125 MG tablet Take 1 tablet (3.125 mg total) by mouth 2 (two) times daily with a meal.  180 tablet  3  . Multiple Vitamins-Minerals (SENIOR MULTIVITAMIN PLUS) TABS Take 1 tablet by mouth 1 dose over 24 hours.  100 tablet  12    Otherwise 12 point review of systems was performed and was unremarkable.  Physical Exam: Pulse: 78  Blood Pressure: 147/86  RR: 12  General: appears stated age, no distress and Patient is frail HEENT: PERRLA, extra ocular movement intact, sclera clear, anicteric and trachea midline Heart: 3/6 SEM is heard at 2nd right intercostal space patient is also an irregular rhythm with a irregular rate Lungs: clear to auscultation, no wheezes or rales and unlabored breathing Abdomen: abdomen is soft without significant tenderness, masses, organomegaly or guarding Extremities: Patient does have significant kyphosis of skeletal exam.  Patient is weak but symmetric in all extremities. Neurovascularly intact in all extremities. Deep Tendon reflexes 2-3+ in the lower extremities. Skin:no rashes, no petechiae, no wounds Neurology: mental status, speech normal, Mini-Mental Status exam 17/30, reflexes normal and symmetric, Babinski response negative and patient has very poor balance and is very unsure on her feet.  Labs and Imaging: Lab Results  Component Value Date/Time   NA 141 10/04/2010  5:53 AM   K 3.4* 10/04/2010  5:53 AM   CL 110 10/04/2010  5:53 AM    CO2 22 10/04/2010  5:53 AM   BUN 22 10/04/2010  5:53 AM   CREATININE 1.33* 10/04/2010  5:53 AM   GLUCOSE 90 10/04/2010  5:53 AM   Lab Results  Component Value Date   WBC 10.0 10/04/2010   HGB 9.0* 10/04/2010   HCT 25.8* 10/04/2010   MCV 84.9 10/04/2010   PLT 262 10/04/2010      Assessment and Plan: BARBIE CROSTON is a 78 y.o. year old female presenting with failure to thrive, dementia, atrial fibrillation, hypertension, chronic kidney disease, osteoarthritis and osteoporosis. Generalized OA Significant. Seems to have encompass all joints. Patient is complaining of significant pain but dementia allows patient to be happy At this time we will add Tylenol 650 mg 3 times a day scheduled.  CHF (congestive heart failure) No nodes in the computer and patient has not been seen over a year. Patient has trace edema on physical exam that is significant murmur that sounds to be aortic regurg. Patient is on a beta blocker. Will consider risk stratifying with labs and potentially optimizing care the patient seems comfortable at this time. We'll need to discuss more with family as well.  Hypertension Patient is at goal at this time and would not try to further control patient is at significant fall risk.  Osteoporosis Patient has significant osteoporosis and osteoarthritis. We'll continue calcium supplementation. We'll work with physical therapy to try to help decrease fall risk. Patient also has significant kyphosis as well as spinal stenosis which makes angulation very difficult.  Paroxysmal atrial fibrillation Patient had a fall and had a history of a bicep hematoma. Patient is a significant fall risk continually aspirin therapy patient is rate controlled with carvedilol at this time.  Kidney disease, chronic, stage III (GFR 30-59 ml/min) Last creatinine over a year ago was 1.33 do not know if lasted help because it would likely not change her management. Will avoid nephrotoxic medications.     failure to thrive-at this time will monitor her weights but likely will need to do supplementation with Ensure or likewise to try to increase caloric intake. Patient does have signs of protein malnutrition   FEN/GI: Regular diet  CODE STATUS: At this point after discussing with granddaughter and daughter they would like her to be full code. Discussed due to patient's frailty she would not do very well with the code we'll need to address this more in greater detail later on.  Disposition: Patient is here for long-term care.

## 2011-12-16 NOTE — Assessment & Plan Note (Signed)
Patient has significant osteoporosis and osteoarthritis. We'll continue calcium supplementation. We'll work with physical therapy to try to help decrease fall risk. Patient also has significant kyphosis as well as spinal stenosis which makes angulation very difficult.

## 2011-12-16 NOTE — Assessment & Plan Note (Signed)
Patient had a fall and had a history of a bicep hematoma. Patient is a significant fall risk continually aspirin therapy patient is rate controlled with carvedilol at this time.

## 2011-12-16 NOTE — Assessment & Plan Note (Signed)
Patient is at goal at this time and would not try to further control patient is at significant fall risk.

## 2011-12-16 NOTE — Assessment & Plan Note (Signed)
Last creatinine over a year ago was 1.33 do not know if lasted help because it would likely not change her management. Will avoid nephrotoxic medications.

## 2011-12-16 NOTE — Assessment & Plan Note (Signed)
No nodes in the computer and patient has not been seen over a year. Patient has trace edema on physical exam that is significant murmur that sounds to be aortic regurg. Patient is on a beta blocker. Will consider risk stratifying with labs and potentially optimizing care the patient seems comfortable at this time. We'll need to discuss more with family as well.

## 2011-12-16 NOTE — Assessment & Plan Note (Signed)
Significant. Seems to have encompass all joints. Patient is complaining of significant pain but dementia allows patient to be happy At this time we will add Tylenol 650 mg 3 times a day scheduled.

## 2011-12-19 ENCOUNTER — Non-Acute Institutional Stay: Payer: Self-pay | Admitting: Family Medicine

## 2011-12-19 ENCOUNTER — Encounter: Payer: Self-pay | Admitting: Family Medicine

## 2011-12-19 DIAGNOSIS — M159 Polyosteoarthritis, unspecified: Secondary | ICD-10-CM

## 2011-12-19 DIAGNOSIS — I10 Essential (primary) hypertension: Secondary | ICD-10-CM

## 2011-12-19 DIAGNOSIS — I509 Heart failure, unspecified: Secondary | ICD-10-CM

## 2011-12-19 DIAGNOSIS — I480 Paroxysmal atrial fibrillation: Secondary | ICD-10-CM

## 2011-12-19 DIAGNOSIS — F0390 Unspecified dementia without behavioral disturbance: Secondary | ICD-10-CM

## 2011-12-19 NOTE — Assessment & Plan Note (Signed)
well controlled  

## 2011-12-19 NOTE — Assessment & Plan Note (Signed)
Clinically in NSR at the time of my exam

## 2011-12-19 NOTE — Assessment & Plan Note (Signed)
She had a more cogent interaction than I expected from her MMSE. Education lack may account for putting her in the moderate dementia range.

## 2011-12-19 NOTE — Assessment & Plan Note (Addendum)
She's not a complainer, but I agree with low dose Acetaminophen CR

## 2011-12-19 NOTE — Progress Notes (Signed)
  Subjective:    Patient ID: Kathleen [name], female    DOB: April 18, 1923, 78 y.o.   MRN: [id number]  HPIShe fell at her daughter's house. Denies loss of consciousness.  Was bothered by all the ruckus there from the dogs and grandchildren.   Admits to chronic pain in her joints, particularly the right shoulder, but it's not severe.   Review of Systems     Objective:   Physical Exam  Constitutional: She is oriented to person, place, and time.       Thin, aged but well appearing  Eyes: Conjunctivae are normal.  Neck: No tracheal deviation present. No thyromegaly present.  Cardiovascular: Normal rate and regular rhythm.   Murmur heard.      Grade 3/6 SEM  Weak carotid pulses, but no bruits  Pulmonary/Chest: Effort normal and breath sounds normal.  Abdominal: Soft. Bowel sounds are normal.       1 cm R and possible umbilical surgical scars.   Musculoskeletal: She exhibits no edema.       Marked Heberden's and Bouchard's nodes  Limited external rotation and abduction R shoulder with crepitus. Unable to get her right hand behind her head.   Lymphadenopathy:    She has no cervical adenopathy.  Neurological: She is alert and oriented to person, place, and time. No cranial nerve deficit.       Registers 3 words, recalls 2  Attends normally  Can't extend DIP joints on left hand  Mild postural tremor  Psychiatric: She has a normal mood and affect. Her behavior is normal.          Assessment & Plan:  I interviewed and examined this patient at [facility] and discussed the treatment plan with Dr Katrinka Blazing.

## 2012-02-22 ENCOUNTER — Non-Acute Institutional Stay: Payer: Self-pay | Admitting: Family Medicine

## 2012-02-22 DIAGNOSIS — D649 Anemia, unspecified: Secondary | ICD-10-CM

## 2012-02-22 DIAGNOSIS — M810 Age-related osteoporosis without current pathological fracture: Secondary | ICD-10-CM

## 2012-02-22 DIAGNOSIS — I10 Essential (primary) hypertension: Secondary | ICD-10-CM

## 2012-02-22 DIAGNOSIS — I480 Paroxysmal atrial fibrillation: Secondary | ICD-10-CM

## 2012-02-23 NOTE — Progress Notes (Signed)
  Subjective:    Patient ID: Kathleen [name], female    DOB: [birth year], 78 y.o.   MRN: [id number]  HPI She was visited in the dining room. Says that she is eating well and walking using the walker.  Arthritis in her hands and knees is her only complaint.    Review of Systems     Objective:   Physical Exam Hands Heberden's and Bouchard's nodes Ankles no edema       Assessment & Plan:

## 2012-02-24 DIAGNOSIS — D649 Anemia, unspecified: Secondary | ICD-10-CM | POA: Insufficient documentation

## 2012-02-24 NOTE — Assessment & Plan Note (Addendum)
Will worsen due to decreased activity Vitamin D level ordered

## 2012-02-24 NOTE — Assessment & Plan Note (Signed)
Rate controlled 

## 2012-02-24 NOTE — Assessment & Plan Note (Signed)
Not well controlled, consider adding HCTZ or ACE CMET ordered

## 2012-03-08 ENCOUNTER — Non-Acute Institutional Stay: Payer: Self-pay | Admitting: Sports Medicine

## 2012-03-08 ENCOUNTER — Encounter: Payer: Self-pay | Admitting: Pharmacist

## 2012-03-08 DIAGNOSIS — M159 Polyosteoarthritis, unspecified: Secondary | ICD-10-CM

## 2012-03-08 DIAGNOSIS — I480 Paroxysmal atrial fibrillation: Secondary | ICD-10-CM

## 2012-03-08 DIAGNOSIS — Z593 Problems related to living in residential institution: Secondary | ICD-10-CM

## 2012-03-08 DIAGNOSIS — I509 Heart failure, unspecified: Secondary | ICD-10-CM

## 2012-03-08 DIAGNOSIS — F0390 Unspecified dementia without behavioral disturbance: Secondary | ICD-10-CM

## 2012-03-08 DIAGNOSIS — I10 Essential (primary) hypertension: Secondary | ICD-10-CM

## 2012-03-09 ENCOUNTER — Encounter: Payer: Self-pay | Admitting: Sports Medicine

## 2012-03-15 ENCOUNTER — Telehealth: Payer: Self-pay | Admitting: Family Medicine

## 2012-03-15 LAB — VITAMIN B12

## 2012-03-15 NOTE — Telephone Encounter (Signed)
Called by [facility] nursing staff. Patient fell 1-2 weeks ago and had continued complains of foot soreness so x-ray was obtained. Foot x-ray had been obtained which showed a transverse nondisplaced fracture of the proximal 1st metatarsal on left foot.   Asked staff about patient pain and they stated patient with minimal complaints at this time. In fact, she has been walking without difficulty on the foot.   I placed verbal order for nonweightbearing status until she can be evaluated by family medicine staff tomorrow as injury not acute and pain well controlled at this time.

## 2012-03-16 ENCOUNTER — Non-Acute Institutional Stay: Payer: Self-pay | Admitting: Family Medicine

## 2012-03-16 NOTE — Progress Notes (Signed)
Subjective:     Patient ID: Kathleen [name], female   DOB: Dec 24, 1922, 78 y.o.   MRN: [id number]  HPI  Patient notified RN of left foot pain yesterday. Has some mild swelling also. She did fall 10-14 days ago while trying to go to the bathroom at night without assistance. Pain subsides with sitting and worse with walking in the midfoot. We obtained plain film yesterday showing nondisplaced proximal left 1st metatarsal fracture.   +polyuria and nocturia.  Review of Systems Denies calf or leg pain, dysuria, abdominal pain, change in mental status. Afebrile. States she has suprapubic pain few days ago.    Objective:   Physical Exam  Left foot mild midfoot edema, TTP over 1st MTP joint. No redness or warmth. No bony deformity.  No calf or leg or ankle swelling.  No suprapubic or abd TTP.    Assessment:     Nondisplaced 1st metatarsal fracture on left    Plan:     Fracture is stable evidenced by patient ambulatory status days after the event. Will place post-op shoe to protect and re-image in 1-2 weeks to assess healing. Consider referral if danger of malunion. Check UA for recurrent polyuria/nocturia with history of questionable suprapubic pain

## 2012-03-19 NOTE — Telephone Encounter (Signed)
See note from 9/6. Ordered post-op shoe, ambulate with assistance. Will repeat plain film in 1-2 weeks.

## 2012-03-28 ENCOUNTER — Non-Acute Institutional Stay: Payer: Self-pay | Admitting: Family Medicine

## 2012-03-28 NOTE — Progress Notes (Signed)
  Subjective:    Patient ID: Kathleen [name], female    DOB: [birth year], 78 y.o.   MRN: [id number]  HPI  1. F/u left metatarsal fracture. Patient is using post-op shoe and usually wheelchair to prevent falls after dx of fracture. She denies pain or swelling currently.  2. Anemia. Hemoglobin in 9-10 range with iron deficiency noted. FOB study is ordered but has not been collected yet. Denies any blood in stool, abdominal pain, diarrhea.   3. CKD. Last cr was 1.2. Is eating/drinking well.  Review of Systems Denies dysuria, hesitancy, abd pain, fever.    Objective:   Physical Exam  Vitals reviewed. Constitutional: She is oriented to person, place, and time. She appears well-developed and well-nourished.  HENT:  Head: Normocephalic and atraumatic.  Pulmonary/Chest: Effort normal and breath sounds normal.  Abdominal: Soft. She exhibits no distension. There is no tenderness. There is no rebound and no guarding.  Musculoskeletal:       Trace-1+ bilateral LE pitting edema to lower shin.  Nontender over left 1st metatarsal.   Neurological: She is alert and oriented to person, place, and time.  Skin: No rash noted.  Psychiatric: She has a normal mood and affect.       Assessment & Plan:   Follow up left foot plain film to check healing. Will DC wheelchair and post op shoe if this is improved. Discuss need for bisphosphonate in setting of low impact fracture and osteoporosis.  F/u FOB and consider IV iron for anemia of CKD most likely.  Follow up creatinine in one week with mild elevation to 1.5 on anemia panel.

## 2012-04-02 ENCOUNTER — Encounter: Payer: Self-pay | Admitting: Pharmacist

## 2012-04-02 ENCOUNTER — Encounter: Payer: Self-pay | Admitting: Sports Medicine

## 2012-04-02 ENCOUNTER — Encounter: Payer: Self-pay | Admitting: Family Medicine

## 2012-04-02 DIAGNOSIS — D649 Anemia, unspecified: Secondary | ICD-10-CM

## 2012-04-02 LAB — BASIC METABOLIC PANEL
BUN: 57 mg/dL — AB (ref 4–21)
Creatinine: 1.6 mg/dL — AB (ref 0.5–1.1)
Sodium: 143 mmol/L (ref 137–147)

## 2012-04-02 NOTE — Telephone Encounter (Signed)
Hemacults x 3 reported as negative on 03/30/12

## 2012-04-30 NOTE — Assessment & Plan Note (Addendum)
Pain with mobility but overall stable. No focal complaints of pain on exam or history following fall.  High risk for fractures given osteoporosis.

## 2012-04-30 NOTE — Assessment & Plan Note (Signed)
Stable/Well Controlled - no changes at this time. 

## 2012-04-30 NOTE — Assessment & Plan Note (Signed)
Continue aspirin only as high fall risk for coumadin.  Discussed with MPOA Abundio Miu) and agree with current medical regimen

## 2012-04-30 NOTE — Progress Notes (Signed)
Late Entry:   [facility]  Patient name: Kathleen [name] MRN [id number]  Date of birth: [birth year]  CC & HPI:  Kathleen [name] is a 78 y.o. female was evaluated at [facility] on 03/08/2012.  Her chronic medical problems include:  # Dementia:  No acute problems.  Performing ADLs with assistance.  Reports fall recently as below.  Complete history taking limited. # HYPERTENSION: taking medications as instructed, no medication side effects noted, no TIA's, no chest pain on exertion, no dyspnea on exertion and no swelling of ankles.  # Generalized OA - reports pain and limitation in mobility but denies acute injury from fall.  Pt does use furniture to help with her mobility.  Denies focal pain. # CHF:  Denies dyspnea, cough, congestion.    ROS:  PER HPI  Pertinent History Reviewed:  Medical & Surgical Hx:  Reviewed: Significant for Osteoprorosis, Anemia, CHF Medications: Reviewed & Updated - see associated section Social History: Reviewed -  reports that she has never smoked. She does not have any smokeless tobacco history on file. Significant for resident of [facility]  Objective Findings:  Vitals:  Filed Vitals:   03/08/12 1800  BP: 168/78  Pulse: 72  Temp: 97.1 F (36.2 C)  SpO2: 97%    PE: GENERAL:  Elderly frail AA female. In no discomfort; no respiratory distress. PSYCH: Alert and appropriately interactive; Insight:Lacking  Limited thought Holiday representative.  Apparent difficulty with hearing, speech somewhat muffled H&N: AT/[place], trachea midline EENT:  MMM, no scleral icterus, EOMi HEART: irregularrly irregular, S1/S2 heard, 3/6 systolic murmur LUNGS: CTA B, no wheezes, no crackles EXTREMITIES: Moves all 4 extremities spontaneously, warm well perfused, no edema, bilateral DP and PT pulses 2/4. NEUROMSK:  Get up and go unable to be performed due to generalized weakness and >1+ assist.        Assessment & Plan:

## 2012-04-30 NOTE — Assessment & Plan Note (Signed)
Moderate dementia as evidenced on MMSE of 17/30 on December 16, 2011.  No changes to regimen at this time.  Continue close monitoring and assistance with ADLs.  Fall risk given generalized weakness.

## 2012-04-30 NOTE — Assessment & Plan Note (Signed)
Will continue to monitor.

## 2012-05-01 ENCOUNTER — Non-Acute Institutional Stay: Payer: Self-pay | Admitting: Sports Medicine

## 2012-05-01 DIAGNOSIS — I480 Paroxysmal atrial fibrillation: Secondary | ICD-10-CM

## 2012-05-01 DIAGNOSIS — T24109A Burn of first degree of unspecified site of unspecified lower limb, except ankle and foot, initial encounter: Secondary | ICD-10-CM

## 2012-05-01 DIAGNOSIS — F0390 Unspecified dementia without behavioral disturbance: Secondary | ICD-10-CM

## 2012-05-01 DIAGNOSIS — Z593 Problems related to living in residential institution: Secondary | ICD-10-CM

## 2012-05-01 DIAGNOSIS — I10 Essential (primary) hypertension: Secondary | ICD-10-CM

## 2012-05-01 DIAGNOSIS — M810 Age-related osteoporosis without current pathological fracture: Secondary | ICD-10-CM

## 2012-05-21 ENCOUNTER — Non-Acute Institutional Stay: Payer: Self-pay | Admitting: Family Medicine

## 2012-05-21 DIAGNOSIS — I509 Heart failure, unspecified: Secondary | ICD-10-CM

## 2012-05-21 DIAGNOSIS — T24109A Burn of first degree of unspecified site of unspecified lower limb, except ankle and foot, initial encounter: Secondary | ICD-10-CM | POA: Insufficient documentation
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
## 2012-05-21 NOTE — Assessment & Plan Note (Signed)
May be some anaerobic colonization, but no apparent invasion. Will use topical Silvadene x 2 days then re-evaluate.

## 2012-05-21 NOTE — Assessment & Plan Note (Signed)
Weight increasing with some edema. Continue to monitor for possible increase in diuretic need

## 2012-05-23 NOTE — Progress Notes (Signed)
LATE ENTRY [facility]  Patient name: Kathleen [name] MRN [id number]  Date of birth: 03/02/1923  CC & HPI:  Kathleen [name] is a 78 y.o. female who was evaluated today at [facility] (Room # [phone]) for follow up of her chronic medical problems:  # Anemia - denies melana, hematachezia  # L metatarsal Fracture - Pt reports increased mobility and improvement in pain.  No falls or presyncope reported.   # Dementia - stable according to nursing staff and daughter.  No reported new confusion  # A. Fib - chronic stable, no syncopal episodes  ------------------------------------------------------------------------------------------------------------------ Medication Side effects: Denies any issues, no reported syncopal episodes or worsening confustion ------------------------------------------------------------------------------------------------------------------ New Concerns:  # Skin - skin tear to R knee earlier this month, now reported bullae on L thigh.  Reports skin tear has significantly improved.  Bullae are ~ 43 days old.  No known eliciting events.  Currently using moisturizing lotion. Bullae intact.      ROS:  PER HPI  Pertinent History Reviewed:  Medical & Surgical Hx:  Reviewed: Significant for Arthritis, Anemia, CHF, Heart murmur, CKD Medications: Reviewed & Updated - see associated section Social History: Reviewed -  reports that she has never smoked. She does not have any smokeless tobacco history on file.   Objective Findings:  Vitals:  Filed Vitals:   04/28/12 1200  BP: 106/66  Pulse: 60  Temp: 96.9 F (36.1 C)  Resp: 18    PE: GENERAL:  Elderly African American female. In no discomfort; no respiratory distress. PSYCH: Alert and appropriately interactive; Insight:Lacking        Mental Status:  Orientation: time and person only  Recall: unable H&N: AT/[place], trachea midline EENT:  MMM, no scleral icterus, EOMi HEART: Irregularly  irregular, s1/s2 heard, 2/6 SEM LUNGS: CTA B, no wheezes, no crackles EXTREMITIES: Moves all 4 extremities spontaneously, warm well perfused, no edema, bilateral DP and PT pulses 2/4.   SKIN: Lesion on L thigh, unclear etiology, bullae intact on distal thigh, ruptured on proximal.  See picture:      Assessment & Plan:

## 2012-05-24 ENCOUNTER — Non-Acute Institutional Stay: Payer: Self-pay | Admitting: Family Medicine

## 2012-05-24 NOTE — Assessment & Plan Note (Signed)
Pt is status post L metatarsal fx.  Doing well no obvious deficit at this time.  Denies excess pain.   Need to consider addition of bisphosphonate,  Will discuss this with Daughter and likely add

## 2012-05-24 NOTE — Assessment & Plan Note (Addendum)
Significant fall risk, ASA only.  Currently in A. Fib but rate controlled

## 2012-05-24 NOTE — Assessment & Plan Note (Signed)
Pt is acutely stable

## 2012-05-24 NOTE — Assessment & Plan Note (Signed)
No clear etiology at time of initial evaluation.  Bullae intact today with exception of proximal lesion.  Will cover with tegaderm.  If other bullae rupture will keep clean and cover with tegaderm.  No apparent infection, no fevers, no chills, minimal erythema noted.  Will continue to monitor closely and consider addition of Silvadene if worsening.  Case discussed with Dr. Fara Boros and discussed with patients daughter
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
## 2012-05-24 NOTE — Assessment & Plan Note (Signed)
Stable/Well Controlled - no changes at this time. 

## 2012-05-24 NOTE — Assessment & Plan Note (Addendum)
Daughter reports pt does not want Influenza vaccine   Pt has expressed this currently as well as in the past that she Absolutely declines the vaccination.  Daughter wishes to support this and respectfully declines vaccination this year.  Have discussed risks/benefits.

## 2012-05-25 ENCOUNTER — Non-Acute Institutional Stay: Payer: Self-pay | Admitting: Family Medicine

## 2012-05-25 NOTE — Progress Notes (Signed)
Went to check on patient's leg with new application of duoderm to make sure it is not causing any contact irritation.  Wounds appear to be healing well.  Lifted edge of dressing and skin appears intact, without any erythema or new rash/blisters.  Will leave DuoDerm in place until next week then remove to reinspect wounds.  Reapply dressing if needed for additional 7 days.

## 2012-06-04 ENCOUNTER — Encounter: Payer: Self-pay | Admitting: Pharmacist

## 2012-06-11 ENCOUNTER — Encounter: Payer: Self-pay | Admitting: Sports Medicine

## 2012-06-11 ENCOUNTER — Encounter: Payer: Self-pay | Admitting: Family Medicine

## 2012-06-11 ENCOUNTER — Non-Acute Institutional Stay: Payer: Self-pay | Admitting: Sports Medicine

## 2012-06-11 DIAGNOSIS — T24109A Burn of first degree of unspecified site of unspecified lower limb, except ankle and foot, initial encounter: Secondary | ICD-10-CM

## 2012-06-11 DIAGNOSIS — I10 Essential (primary) hypertension: Secondary | ICD-10-CM

## 2012-06-11 DIAGNOSIS — I509 Heart failure, unspecified: Secondary | ICD-10-CM

## 2012-06-11 DIAGNOSIS — I480 Paroxysmal atrial fibrillation: Secondary | ICD-10-CM

## 2012-06-11 DIAGNOSIS — D649 Anemia, unspecified: Secondary | ICD-10-CM
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
## 2012-06-11 NOTE — Assessment & Plan Note (Signed)
>  Consider IV Iron infusion (FaraHeme available)  per FAIR-HF and FERRIC-HF trials

## 2012-06-11 NOTE — Assessment & Plan Note (Addendum)
Anemia panel pending >consider FeraHeme to keep ferritin >100

## 2012-06-11 NOTE — Assessment & Plan Note (Signed)
Stable/Well Controlled - no changes at this time. 

## 2012-06-11 NOTE — Assessment & Plan Note (Signed)
Significantly improved. Continue q weekly changes per wound care

## 2012-06-11 NOTE — Assessment & Plan Note (Signed)
Rate controlled today and over weekend with vitals

## 2012-06-14 ENCOUNTER — Encounter: Payer: Self-pay | Admitting: Sports Medicine

## 2012-06-29 ENCOUNTER — Non-Acute Institutional Stay: Payer: Self-pay | Admitting: Family Medicine

## 2012-06-29 NOTE — Progress Notes (Signed)
Patient noted to have normal B12, folate, negative guiac x3.  Low iron, %sat, and ferritin.  No obvious source of blood loss.  Would likely benefit from IV iron given dx of heart failure.

## 2012-07-13 LAB — CBC AND DIFFERENTIAL
HCT: 29 %
MCH: 30.3
MCV: 86.7 fL (ref 78–100)
RBC: 3.3
Retic %: 1.1
WBC: 5.1

## 2012-08-09 ENCOUNTER — Non-Acute Institutional Stay: Payer: Self-pay | Admitting: Family Medicine

## 2012-08-09 DIAGNOSIS — M159 Polyosteoarthritis, unspecified: Secondary | ICD-10-CM

## 2012-08-09 DIAGNOSIS — I10 Essential (primary) hypertension: Secondary | ICD-10-CM

## 2012-08-09 DIAGNOSIS — F0390 Unspecified dementia without behavioral disturbance: Secondary | ICD-10-CM

## 2012-08-09 DIAGNOSIS — I480 Paroxysmal atrial fibrillation: Secondary | ICD-10-CM

## 2012-08-09 DIAGNOSIS — I509 Heart failure, unspecified: Secondary | ICD-10-CM

## 2012-08-09 NOTE — Assessment & Plan Note (Signed)
Pleasantly interactive. May not be remembering to ask for Tramadol

## 2012-08-09 NOTE — Assessment & Plan Note (Signed)
More elevated recently. Consider adding HCTZ 25 mg in AM since she has some edema and her weight has been increasing.

## 2012-08-09 NOTE — Assessment & Plan Note (Signed)
In regular rhythm

## 2012-08-09 NOTE — Assessment & Plan Note (Signed)
On scheduled Acetaminophen,

## 2012-08-15 ENCOUNTER — Non-Acute Institutional Stay: Payer: Self-pay | Admitting: Sports Medicine

## 2012-08-15 DIAGNOSIS — I10 Essential (primary) hypertension: Secondary | ICD-10-CM

## 2012-08-15 DIAGNOSIS — I509 Heart failure, unspecified: Secondary | ICD-10-CM

## 2012-08-15 DIAGNOSIS — M159 Polyosteoarthritis, unspecified: Secondary | ICD-10-CM

## 2012-08-15 NOTE — Progress Notes (Signed)
  [facility]  Patient name: Kathleen [name] MRN [id number]  Date of birth: Apr 20, 1923  CC & HPI:  Kathleen [name] is a 79 y.o. female who was evaluated today at [facility] for follow up of her chronic medical problems:  # HF: no orthopnea, no chest pain, no marked swelling  # OA/Weakness:  Improved with therapy, no concerns voiced by pt or daughter  # A Fib: no weakness no orthostasis, no reported palpitations, no unilateral weakness, new slurred speech or significant change in mental status  #  Hypertension - chronic problem, marginally controlled.    Home BP checks/ranges: 140s-160s  no orthostasis  no chest pain, no dyspnea on exertion, no orthopnea/PND, no peripheral edema,   no episodes of unilateral weakness, dysarthria or acute visual changes ------------------------------------------------------------------------------------------------------------------ Medication Side effects: None Noted  ROS:  PER HPI  Pertinent History Reviewed:  Medical & Surgical Hx:  Reviewed: Significant for A Fib, CHF, CKD stage III, Falls, anemia, and dementia Medications: Reviewed & Updated - see associated section Social History: Reviewed -  reports that she has never smoked. She does not have any smokeless tobacco history on file.  Objective Findings:  Vitals:  There were no vitals filed for this visit.  PE: GENERAL:  Elderly, frail, AA  female. In no discomfort; no respiratory distress. PSYCH: Alert and appropriately interactive; Insight:Lacking        Mental Status:  Orientation: To person, place, not time or situation  Recall: unable H&N: AT/[place], trachea midline EENT:  MMM, no scleral icterus, EOMi HEART: irregularrly irregular,  LUNGS: CTA B, no wheezes, no crackles EXTREMITIES: Moves all 4 extremities spontaneously, warm well perfused, no edema, bilateral DP and PT pulses 1/4.    Assessment & Plan:

## 2012-08-16 LAB — BASIC METABOLIC PANEL
BUN: 48 mg/dL — AB (ref 4–21)
Creatinine: 1.5 mg/dL — AB (ref 0.5–1.1)

## 2012-08-22 LAB — BASIC METABOLIC PANEL
Creatinine: 1.3 mg/dL — AB (ref 0.5–1.1)
Potassium: 4.3 mmol/L (ref 3.4–5.3)
Sodium: 144 mmol/L (ref 137–147)

## 2012-08-28 ENCOUNTER — Other Ambulatory Visit: Payer: Self-pay | Admitting: Sports Medicine

## 2012-08-28 ENCOUNTER — Encounter: Payer: Self-pay | Admitting: Sports Medicine

## 2012-09-17 ENCOUNTER — Encounter: Payer: Self-pay | Admitting: Sports Medicine

## 2012-10-02 ENCOUNTER — Non-Acute Institutional Stay: Payer: Medicare Other | Admitting: Family Medicine

## 2012-10-02 ENCOUNTER — Encounter: Payer: Self-pay | Admitting: Pharmacist

## 2012-10-02 ENCOUNTER — Encounter: Payer: Self-pay | Admitting: Family Medicine

## 2012-10-02 DIAGNOSIS — Z9181 History of falling: Secondary | ICD-10-CM

## 2012-10-02 DIAGNOSIS — I509 Heart failure, unspecified: Secondary | ICD-10-CM

## 2012-10-02 DIAGNOSIS — I10 Essential (primary) hypertension: Secondary | ICD-10-CM

## 2012-10-02 NOTE — Progress Notes (Signed)
  Subjective:    Patient ID: Kathleen [name], female    DOB: 1922/11/02, 79 y.o.   MRN: [id number]  HPI She fell into a sitting position 09/30/12 coming out of the bathroom and recalls that she slipped on the waxed floor. She is a little sorer in her chronically painful left hip, left lateral chest, and shoulder area. She denies problems with her breasts. She denies being lightheaded or having problems with her breathing or heart. Her right foot hurts at times.    Review of Systems     Objective:   Physical Exam  Cardiovascular: Normal rate and regular rhythm.   Pulmonary/Chest: Effort normal and breath sounds normal. She has no rales. She exhibits no tenderness.  Her right lateral chest was observed and palpated seeing no abnormality.  Abdominal: Soft. She exhibits no distension and no mass. There is no tenderness. There is no guarding.  Musculoskeletal: She exhibits no edema.  Bilateral hallux valgus without inflammation.  Crepitus is palpable on rotation of her right shoulder, but she can get both hands on top of her head.  Marked DJD changes in her hands.   Neurological: She is alert.  Psychiatric: She has a normal mood and affect. Her behavior is normal. Thought content normal.   She was observed coming out of her bathroom in her usual stooped posture not using an assistive device and sat in bed without gross instability. Earlier she was seen moving in her wheelchair propelled by her legs.        Assessment & Plan:

## 2012-10-02 NOTE — Assessment & Plan Note (Signed)
well controlled  

## 2012-10-02 NOTE — Assessment & Plan Note (Signed)
She continues at high fall risk due to her dysmobility caused be severe degenerative joint disease. She was counseled to remain active using her assistive devices.

## 2012-10-02 NOTE — Assessment & Plan Note (Signed)
well controlled without indications of orthostatic hypotension.

## 2012-10-03 ENCOUNTER — Encounter: Payer: Self-pay | Admitting: Home Health Services

## 2012-10-03 NOTE — Assessment & Plan Note (Addendum)
Stable/Well Controlled - Add ACEi and continue po Iron

## 2012-10-03 NOTE — Assessment & Plan Note (Signed)
Will need to monitor for worsening renal function and hold ACEi for acute illnesses and if PO intake decreases

## 2012-10-03 NOTE — Assessment & Plan Note (Addendum)
BP Has been gradually increasing. Will add low dose ACEi in setting of HF and hypertension.  Checked BMET on initial day of therapy and 1 week later that were unremarkable.

## 2012-10-03 NOTE — Assessment & Plan Note (Signed)
No current concerns, denies pain on exam.

## 2012-10-30 ENCOUNTER — Non-Acute Institutional Stay (INDEPENDENT_AMBULATORY_CARE_PROVIDER_SITE_OTHER): Payer: Medicare Other | Admitting: Sports Medicine

## 2012-10-30 DIAGNOSIS — I10 Essential (primary) hypertension: Secondary | ICD-10-CM

## 2012-10-30 DIAGNOSIS — M159 Polyosteoarthritis, unspecified: Secondary | ICD-10-CM

## 2012-10-30 DIAGNOSIS — F0390 Unspecified dementia without behavioral disturbance: Secondary | ICD-10-CM

## 2012-10-30 DIAGNOSIS — I509 Heart failure, unspecified: Secondary | ICD-10-CM

## 2012-10-30 DIAGNOSIS — K6389 Other specified diseases of intestine: Secondary | ICD-10-CM

## 2012-10-30 DIAGNOSIS — R198 Other specified symptoms and signs involving the digestive system and abdomen: Secondary | ICD-10-CM

## 2012-11-05 MED ORDER — PSYLLIUM 28 % PO PACK: 1.0000 | PACK | Freq: Every day | ORAL | Status: DC

## 2012-11-05 NOTE — Progress Notes (Signed)
[facility]  Patient name: Kathleen [name] MRN [id number]  Date of birth: Nov 16, 1922  CC & HPI:  Kathleen [name] is a 79 y.o. female who was evaluated today at [facility] (Room # [phone]) for follow up of her chronic medical problems:  #  Hypertension - chronic problem, well controlled.    no orthostasis  no chest pain, no dyspnea on exertion, no orthopnea/PND, no peripheral edema,   no episodes of unilateral weakness, dysarthria or acute visual changes  # Osteoarthritis & deconditioning: Patient reports that she is doing okay.  In discussions with her daughter does appear that she has been relying on her wheelchair more.  She denies any falls since our last meeting, there are none reported.  # CHF:  Denies ortho stasis, significant LE edema, chest pain, exertional dyspnea, orthopnea.  Has been started on iron  ------------------------------------------------------------------------------------------------------------------ Medication Side effects: None reproted ------------------------------------------------------------------------------------------------------------------ New Concerns:  Irregular Bowel Habits:  Has been having intermittent issues with constipation per daughter.  Pt denies.  No blood reported by nursing staff or pt.     ROS:  PER HPI  Pertinent History Reviewed:  Medical & Surgical Hx:  Reviewed: Significant for CHF, A fib, not anti-coagulated given high fall risk Medications: Reviewed & Updated Prior to Admission medications   Medication Sig Start Date End Date Taking? Authorizing Provider  acetaminophen (TYLENOL 8 HOUR) 650 MG CR tablet Take 1 tablet (650 mg total) by mouth every 8 (eight) hours. 12/16/11 12/15/12  Judi Saa, DO  aspirin 81 MG tablet Take 81 mg by mouth daily.      Historical Provider, MD  calcium-vitamin D (OSCAL WITH D) 500-200 MG-UNIT per tablet Take 1 tablet by mouth 2 (two) times daily. 11/26/10   Nestor Ramp,  MD  carvedilol (COREG) 3.125 MG tablet Take 1 tablet (3.125 mg total) by mouth 2 (two) times daily with a meal. 11/26/10   Nestor Ramp, MD  docusate sodium (COLACE) 100 MG capsule Take 100 mg by mouth daily.    Historical Provider, MD  ferrous sulfate (FERROUSUL) 325 (65 FE) MG tablet Take 325 mg by mouth 2 (two) times daily.  04/02/12   Tobin Chad, MD  lisinopril (PRINIVIL,ZESTRIL) 5 MG tablet Take 5 mg by mouth daily.    Historical Provider, MD  Multiple Vitamins-Minerals (SENIOR MULTIVITAMIN PLUS) TABS Take 1 tablet by mouth 1 dose over 24 hours. 11/26/10   Nestor Ramp, MD  Nutritional Supplement LIQD Take 60 mLs by mouth daily. MED PASS    Historical Provider, MD   Social History: Reviewed -  reports that she has never smoked. She does not have any smokeless tobacco history on file.  Objective Findings:  Vitals:  Filed Vitals:   10/13/12 1445  BP: 119/65  Pulse: 67  Temp: 98.8 F (37.1 C)  Resp: 18    PE: GENERAL:  Adult thin AA  female. In no discomfort; no respiratory distress. PSYCH: Alert and appropriately interactive; Insight:Lacking        Mental Status:  Orientation: To place, person, not time  Recall: unable H&N: AT/[place], trachea midline, no JVD, no hepatojugular reflux EENT:  MMM, no scleral icterus, EOMi HEART: Irregularly irregular, s1/s2 heard, 2/6 murmur LUNGS: CTA B, no wheezes, no crackles ABDOMEN: +BS, soft, non-tender, no rigidity, no guarding, no masses/organomegaly EXTREMITIES: Moves all 4 extremities spontaneously, warm well perfused, trace edema, bilateral DP and PT pulses 1+/4.   NEUROMSK: Sit to stand: ~7 seconds, unable  to ambulate unassisted, unsteady.  Able to take 1 step unassisted but sits back down following   Assessment & Plan:

## 2012-11-08 ENCOUNTER — Encounter: Payer: Self-pay | Admitting: Pharmacist

## 2012-11-08 DIAGNOSIS — R198 Other specified symptoms and signs involving the digestive system and abdomen: Secondary | ICD-10-CM | POA: Insufficient documentation

## 2012-11-08 NOTE — Assessment & Plan Note (Signed)
Significantly improved. Stable/Well Controlled - no changes at this time. Continue to monitor

## 2012-11-08 NOTE — Assessment & Plan Note (Addendum)
No evidence of acute failure.  Patient does have known aortic regurgitation but no recent echo.  If any type of clinical deconditioning or significant change will consider repeat echo however will defer to time.  Continue by mouth iron

## 2012-11-08 NOTE — Assessment & Plan Note (Signed)
No significant change noted.  Consider repeat MMSE in June or July

## 2012-11-08 NOTE — Assessment & Plan Note (Signed)
Continue encouraging daily ambulation.  Patient does have significant osteoarthritis and is limited in her mobility but is not a surgical candidate.  We will to provide symptomatic relief as needed with by mouth Tylenol

## 2012-11-08 NOTE — Assessment & Plan Note (Signed)
Unclear from history obtained from patient however daughter does report patient is complaining of abdominal pain and straining when defecating.  Will start on Metamucil q. a.m. to also encourage adequate by mouth intake.  If not significantly improved with Metamucil I would recommend serial Hemoccults and potential consideration of colonoscopy however I do question if this would be appropriate in this 79 year old female.  Followup next month.  If continuing to have issues or any noted melena or hematochezia will recommend serial Hemoccults.  If positive we'll discuss with daughter [name]) for appropriateness of further evaluation.

## 2012-11-21 ENCOUNTER — Non-Acute Institutional Stay: Payer: Medicare Other | Admitting: Family Medicine

## 2012-11-21 DIAGNOSIS — M159 Polyosteoarthritis, unspecified: Secondary | ICD-10-CM

## 2012-11-21 DIAGNOSIS — I509 Heart failure, unspecified: Secondary | ICD-10-CM

## 2012-11-21 DIAGNOSIS — I10 Essential (primary) hypertension: Secondary | ICD-10-CM

## 2012-11-21 NOTE — Assessment & Plan Note (Signed)
Stable, euvolemic. 

## 2012-11-21 NOTE — Assessment & Plan Note (Signed)
Pain adequately controlled.  Continue current management

## 2012-11-21 NOTE — Assessment & Plan Note (Signed)
History of good control.  Last checked BP isolated.  Continue to monitor.

## 2012-11-21 NOTE — Progress Notes (Signed)
  Subjective:    Patient ID: Kathleen [name], female    DOB: 1922/12/20, 79 y.o.   MRN: [id number]  HPI  79 yo seen for routine nursing home follow-up at [facility]  Patient seen in dining room.  Sitting in wheelchair.  She reports right knee pain, controlled with pain meds.  Checked on left upper leg blister- patient reports no pain.  Patient reports having a nice weekend with her family over the weekend.  Has no complaints.  Review of Systems See HPI    Objective:   Physical Exam  GEN: Alert, No acute distress.  Sitting in wheelchair, pleasant in good spirits. CV:  Regular Rate & Rhythm, no murmur Respiratory:  Normal work of breathing, CTAB Abd:  + BS, soft, no tenderness to palpation Ext: no pre-tibial edema Two flaccid vesicles- no cellulitis located on right thigh, one on top, one lateral.       Assessment & Plan:

## 2012-12-18 ENCOUNTER — Non-Acute Institutional Stay: Payer: Self-pay | Admitting: Sports Medicine

## 2012-12-18 DIAGNOSIS — I5040 Unspecified combined systolic (congestive) and diastolic (congestive) heart failure: Secondary | ICD-10-CM

## 2012-12-18 DIAGNOSIS — Z9181 History of falling: Secondary | ICD-10-CM

## 2012-12-18 DIAGNOSIS — I10 Essential (primary) hypertension: Secondary | ICD-10-CM

## 2012-12-18 DIAGNOSIS — Z593 Problems related to living in residential institution: Secondary | ICD-10-CM

## 2012-12-18 DIAGNOSIS — F0390 Unspecified dementia without behavioral disturbance: Secondary | ICD-10-CM

## 2012-12-18 DIAGNOSIS — T24102S Burn of first degree of unspecified site of left lower limb, except ankle and foot, sequela: Secondary | ICD-10-CM

## 2012-12-18 NOTE — Assessment & Plan Note (Addendum)
MMSE 20/30 (up from 17/30).   Marked decreases in higher level cognitive function but preserved function in orientation.   Appears to be stable.  Previously patient and family has not been amenable to dementia agents  however patient seems to be much more agreeable and will need to discuss with her daughter.   > Consider Namena vs Cholinesterase inhibitors

## 2012-12-18 NOTE — Progress Notes (Signed)
[facility]  Patient name: Kathleen [name] MRN [id number]  Date of birth: 11-23-1922  CC & HPI:  Kathleen [name] is a 79 y.o. female who was evaluated today at [facility] (Room # [id number]) for follow up of her chronic medical problems:  # Hypertension - chronic problem, well controlled.    no orthostasis  no chest pain, no dyspnea on exertion, no orthopnea/PND, no peripheral edema,   no episodes of unilateral weakness, dysarthria or acute visual changes  # Osteoarthritis & deconditioning: Patient reports that she is doing okay.  In discussions with her daughter does appear that she has been relying on her wheelchair more.  She denies any falls since our last meeting, there are none reported.  # CHF:  Denies orthostasis, significant LE edema, chest pain, exertional dyspnea, orthopnea.  Has been started on iron  #  Mental Status: No new concerns from daughter, facility or pt regarding mental status  # Skin: reported lesions on leg and thigh again by daughter.   ------------------------------------------------------------------------------------------------------------------ Medication Side effects: None reproted  ROS:  PER HPI  Pertinent History Reviewed:  Medical & Surgical Hx:  Reviewed: Significant for CHF, A fib, not anti-coagulated given high fall risk Medications: Reviewed & Updated Prior to Admission medications   Medication Sig Start Date End Date Taking? Authorizing Provider  acetaminophen (TYLENOL 8 HOUR) 650 MG CR tablet Take 1 tablet (650 mg total) by mouth every 8 (eight) hours. 12/16/11 12/15/12  Judi Saa, DO  aspirin 81 MG tablet Take 81 mg by mouth daily.      Historical Provider, MD  calcium-vitamin D (OSCAL WITH D) 500-200 MG-UNIT per tablet Take 1 tablet by mouth 2 (two) times daily. 11/26/10   Nestor Ramp, MD  carvedilol (COREG) 3.125 MG tablet Take 1 tablet (3.125 mg total) by mouth 2 (two) times daily with a meal. 11/26/10   Nestor Ramp, MD  docusate sodium (COLACE) 100 MG capsule Take 100 mg by mouth daily.    Historical Provider, MD  ferrous sulfate (FERROUSUL) 325 (65 FE) MG tablet Take 325 mg by mouth 2 (two) times daily.  04/02/12   Tobin Chad, MD  lisinopril (PRINIVIL,ZESTRIL) 5 MG tablet Take 5 mg by mouth daily.    Historical Provider, MD  Multiple Vitamins-Minerals (SENIOR MULTIVITAMIN PLUS) TABS Take 1 tablet by mouth 1 dose over 24 hours. 11/26/10   Nestor Ramp, MD  Nutritional Supplement LIQD Take 60 mLs by mouth daily. MED PASS    Historical Provider, MD   Social History: Reviewed -  reports that she has never smoked. She does not have any smokeless tobacco history on file.  Objective Findings:  Vitals:  Filed Vitals:   12/15/12 0800  BP: 136/64  Pulse: 58  Temp: 97.8 F (36.6 C)  Resp: 18    PE: GENERAL:  Adult thin AA  female. In no discomfort; no respiratory distress. PSYCH: Alert and appropriately interactive; Insight:Lacking  MMSE completed: documented under flowchart. H&N: AT/[place], trachea midline, no JVD, no hepatojugular reflux EENT:  MMM, no scleral icterus, EOMi HEART: Irregularly irregular, s1/s2 heard, 2/6 murmur LUNGS: CTA B, no wheezes, no crackles ABDOMEN: +BS, soft, non-tender, no rigidity, no guarding, no masses/organomegaly EXTREMITIES: Moves all 4 extremities spontaneously, warm well perfused, trace edema, bilateral DP and PT pulses 1+/4.   NEUROMSK: Sit to stand: ~7 seconds, unable to ambulate unassisted, unsteady.  Able to take 1 step unassisted but sits back down following Skin: hyperpigmented  scarring on left anterior thigh without evidence of bullae,  erythema, open wound.   Assessment & Plan:

## 2012-12-25 ENCOUNTER — Encounter: Payer: Self-pay | Admitting: Sports Medicine

## 2012-12-25 NOTE — Assessment & Plan Note (Signed)
No evidence of recurrent significant burn but does appear to be some new lesions.  Unsure as to what the eliciting event is but encouraged patient to try to avoid any type of hot objects in her lap

## 2012-12-25 NOTE — Assessment & Plan Note (Signed)
Blood pressure significantly improved.  Patient is asymptomatic > Continue to monitor, is appropriate to continue on beta blocker for cardio protection as well as ACE inhibitor for renal protection

## 2012-12-25 NOTE — Assessment & Plan Note (Signed)
Plan to recheck iron in 2 months

## 2012-12-25 NOTE — Assessment & Plan Note (Signed)
A. she should be encouraged to continue to ambulate however is at risk for falls.  Encouraged to ambulate with supervision.  No falls reported recently

## 2013-01-08 ENCOUNTER — Non-Acute Institutional Stay: Payer: Self-pay | Admitting: Family Medicine

## 2013-01-08 DIAGNOSIS — R531 Weakness: Secondary | ICD-10-CM

## 2013-01-08 NOTE — Progress Notes (Signed)
Progress Note: Called to bedside at [facility] earlier this evening around 5pm. Patient called her daughter at 4:45pm telling her that she was feeling weak and having impending sense of doom. She was also stating increased urinary frequency.   Patient had vitals checked which were the following: BP: 190/72, HR: 70, T:97.8, O2: 100%, RR:20. Physical exam was overall benign with normal CN2-12, some mild tenderness in left abdominal quadrant  On return to patient's room to check on her, she was comfortably eating dinner, in no acute distress - ordered BMP for electrolytes and kidney function - ordered CBC for anemia - obtain cath UA and UCx.   Marena Chancy, PGY-3 Family Medicine Resident

## 2013-01-12 ENCOUNTER — Telehealth: Payer: Self-pay | Admitting: Family Medicine

## 2013-01-12 NOTE — Telephone Encounter (Signed)
Received a call from [facility] today. Urine culture resulted with > 100K Enterobacter Aerogenes. Patient is out on day pass with family for the weekend. Sensitivities to Levaquin, Cipro, and bactrim.  Pt with known renal disease. Will prescribe  Bactrim: 1 DS Tab Q 12h for 5d

## 2013-01-14 ENCOUNTER — Other Ambulatory Visit: Payer: Self-pay | Admitting: Family Medicine

## 2013-01-14 ENCOUNTER — Encounter: Payer: Self-pay | Admitting: Family Medicine

## 2013-01-14 MED ORDER — SULFAMETHOXAZOLE-TRIMETHOPRIM 800-160 MG PO TABS: 1.0000 | ORAL_TABLET | Freq: Two times a day (BID) | ORAL | Status: AC

## 2013-01-14 NOTE — Telephone Encounter (Signed)
Error

## 2013-02-07 ENCOUNTER — Non-Acute Institutional Stay: Payer: Medicare Other | Admitting: Family Medicine

## 2013-02-07 DIAGNOSIS — Z9181 History of falling: Secondary | ICD-10-CM

## 2013-02-07 DIAGNOSIS — I509 Heart failure, unspecified: Secondary | ICD-10-CM

## 2013-02-07 DIAGNOSIS — I10 Essential (primary) hypertension: Secondary | ICD-10-CM

## 2013-02-07 DIAGNOSIS — F0390 Unspecified dementia without behavioral disturbance: Secondary | ICD-10-CM

## 2013-02-07 NOTE — Progress Notes (Signed)
Subjective:     Patient ID: Kathleen [name], female   DOB: [birth year], 79 y.o.   MRN: [id number]  HPI 79 year old nursing home resident with mild to moderate dementia, osteoarthritis and cervical spinal stenosis evaluated for routine followup.   Interval history: 01/08/13  patient with urinary frequency and malaise. UCx obtained, > 100 K enterobacteria, treated with Bactrim DS 1 tab BID x 5 days.   History  Substance Use Topics  . Smoking status: Never Smoker   . Smokeless tobacco: Not on file  . Alcohol Use: No   Review of Systems Gen: Admits to daytime sleepiness and napping. Nighttime insomnia. She reports that her roommates television keeps her up at night. Cardiovascular: Denies chest pain and lower extremity edema. Respiratory: Denies cough, wheezing, shortness of breath GI: Denies nausea, vomiting, diarrhea, constipation. GU: Admits urinary frequency gets up for infiltrate frequently when lying down to urinate. Denies dysuria Musculoskeletal: Patient complains of bilateral anterior leg burning is relieved with healing lotion. She also missed a bilateral extremity weakness.    Objective:   Physical Exam BP 150/71  Pulse 52  Temp(Src) 97.6 F (36.4 C)  Resp 18  Wt 126 lb 9.6 oz (57.425 kg)  BMI 24.72 kg/m2 Wt Readings from Last 3 Encounters:  01/11/13 126 lb 9.6 oz (57.425 kg)  11/21/12 118 lb 9.6 oz (53.797 kg)  09/29/11 99 lb 9.6 oz (45.178 kg)  General appearance: alert, cooperative, appears stated age and no distress Neck: Neck is at 45 of extension. no adenopathy, no carotid bruit, no JVD and thyroid not enlarged, symmetric, no tenderness/mass/nodules Lungs: clear to auscultation bilaterally Heart: regular rate and rhythm, S1, S2 normal and systolic murmur: early systolic 3/6, decrescendo at 2nd left intercostal space Abdomen: soft, non-tender; bowel sounds normal; no masses,  no organomegaly Extremities: extremities normal, atraumatic, no cyanosis, feet are cold  to touch b/l, 1+ LE edema b/l Skin: Skin color, texture, turgor normal. No rashes or lesions Neurologic:  Patient is alert and oriented x4. Patient recalls two out of three on short-term recall.   lower extremities weakness is noted, lower extremity strength 4/5 bilaterally.Patient is able to stand with support.    Medication List       This list is accurate as of: 02/07/13  4:35 PM.  Always use your most recent med list.               aspirin 81 MG tablet  Take 81 mg by mouth daily.     calcium-vitamin D 500-200 MG-UNIT per tablet  Commonly known as:  OSCAL WITH D  Take 1 tablet by mouth 2 (two) times daily.     carvedilol 3.125 MG tablet  Commonly known as:  COREG  Take 1 tablet (3.125 mg total) by mouth 2 (two) times daily with a meal.     docusate sodium 100 MG capsule  Commonly known as:  COLACE  Take 100 mg by mouth daily.     FERROUSUL 325 (65 FE) MG tablet  Generic drug:  ferrous sulfate  Take 325 mg by mouth 2 (two) times daily.     lisinopril 5 MG tablet  Commonly known as:  PRINIVIL,ZESTRIL  Take 5 mg by mouth daily.     NUTRITIONAL SUPPLEMENT Liqd  Take 60 mLs by mouth daily. MED PASS     psyllium 28 % packet  Commonly known as:  METAMUCIL SMOOTH TEXTURE  Take 1 packet by mouth daily with breakfast.     SENIOR MULTIVITAMIN PLUS  Tabs  Take 1 tablet by mouth 1 dose over 24 hours.          Assessment and Plan:

## 2013-02-08 ENCOUNTER — Encounter: Payer: Self-pay | Admitting: Family Medicine

## 2013-02-08 NOTE — Assessment & Plan Note (Signed)
A: Most recent blood pressure is elevated. Overall patient's blood pressures are well controlled.  Med: Continue beta blocker and ACE.  P: Checking BMP.

## 2013-02-08 NOTE — Assessment & Plan Note (Signed)
A: Patient has some edema consistent with mild fluid overload. She has no chest pain or shortness of breath and her blood pressures remain well-controlled. I suspect some of her urinary frequency is related to positional diuresis. P: No need to start Lasix at this time. We'll continue to manage blood pressure. Will check CBC, iron and ferritin to make sure patient's iron is adequately repleted. Will followup weights and BMP.

## 2013-02-08 NOTE — Assessment & Plan Note (Signed)
A: Patient's PCP is following closely. Her Mini-Mental status is stable. I discussed with her daughter that her Mini-Mental status scores fall ID recommended starting Aricept for prevention of progression of dementia. P: Will followup Q4 to 6 months Mini-Mental Status exam performed by the patient's PCP.

## 2013-02-08 NOTE — Assessment & Plan Note (Signed)
A: Patient is a fall risk due to her extremely weakness and dementia. P:Will continue vitamin D and calcium. Will order repeat course of physical therapy will call the patient able to ambulate via walker.

## 2013-02-18 ENCOUNTER — Inpatient Hospital Stay (HOSPITAL_COMMUNITY)
Admission: EM | Admit: 2013-02-18 | Discharge: 2013-02-22 | DRG: 871 | Disposition: A | Discharge status: Home Health | Payer: Medicare Other | Attending: Family Medicine | Admitting: Family Medicine

## 2013-02-18 ENCOUNTER — Non-Acute Institutional Stay (INDEPENDENT_AMBULATORY_CARE_PROVIDER_SITE_OTHER): Payer: Medicare Other | Admitting: Family Medicine

## 2013-02-18 ENCOUNTER — Emergency Department (HOSPITAL_COMMUNITY): Payer: Medicare Other

## 2013-02-18 ENCOUNTER — Encounter (HOSPITAL_COMMUNITY): Payer: Self-pay | Admitting: Emergency Medicine

## 2013-02-18 DIAGNOSIS — I129 Hypertensive chronic kidney disease with stage 1 through stage 4 chronic kidney disease, or unspecified chronic kidney disease: Secondary | ICD-10-CM | POA: Diagnosis present

## 2013-02-18 DIAGNOSIS — R410 Disorientation, unspecified: Secondary | ICD-10-CM

## 2013-02-18 DIAGNOSIS — R6889 Other general symptoms and signs: Secondary | ICD-10-CM

## 2013-02-18 DIAGNOSIS — I10 Essential (primary) hypertension: Secondary | ICD-10-CM | POA: Diagnosis present

## 2013-02-18 DIAGNOSIS — B961 Klebsiella pneumoniae [K. pneumoniae] as the cause of diseases classified elsewhere: Secondary | ICD-10-CM | POA: Diagnosis present

## 2013-02-18 DIAGNOSIS — R001 Bradycardia, unspecified: Secondary | ICD-10-CM

## 2013-02-18 DIAGNOSIS — N390 Urinary tract infection, site not specified: Secondary | ICD-10-CM

## 2013-02-18 DIAGNOSIS — I509 Heart failure, unspecified: Secondary | ICD-10-CM | POA: Diagnosis present

## 2013-02-18 DIAGNOSIS — Z79899 Other long term (current) drug therapy: Secondary | ICD-10-CM

## 2013-02-18 DIAGNOSIS — N12 Tubulo-interstitial nephritis, not specified as acute or chronic: Secondary | ICD-10-CM | POA: Diagnosis present

## 2013-02-18 DIAGNOSIS — N10 Acute tubulo-interstitial nephritis: Secondary | ICD-10-CM | POA: Diagnosis present

## 2013-02-18 DIAGNOSIS — R404 Transient alteration of awareness: Secondary | ICD-10-CM

## 2013-02-18 DIAGNOSIS — M159 Polyosteoarthritis, unspecified: Secondary | ICD-10-CM | POA: Diagnosis present

## 2013-02-18 DIAGNOSIS — Z7982 Long term (current) use of aspirin: Secondary | ICD-10-CM

## 2013-02-18 DIAGNOSIS — N183 Chronic kidney disease, stage 3 unspecified: Secondary | ICD-10-CM

## 2013-02-18 DIAGNOSIS — R680 Hypothermia, not associated with low environmental temperature: Secondary | ICD-10-CM | POA: Diagnosis present

## 2013-02-18 DIAGNOSIS — R32 Unspecified urinary incontinence: Secondary | ICD-10-CM | POA: Diagnosis present

## 2013-02-18 DIAGNOSIS — I4891 Unspecified atrial fibrillation: Secondary | ICD-10-CM | POA: Diagnosis present

## 2013-02-18 DIAGNOSIS — G9340 Encephalopathy, unspecified: Secondary | ICD-10-CM | POA: Diagnosis present

## 2013-02-18 DIAGNOSIS — A419 Sepsis, unspecified organism: Principal | ICD-10-CM | POA: Diagnosis present

## 2013-02-18 DIAGNOSIS — I480 Paroxysmal atrial fibrillation: Secondary | ICD-10-CM | POA: Diagnosis present

## 2013-02-18 DIAGNOSIS — I498 Other specified cardiac arrhythmias: Secondary | ICD-10-CM

## 2013-02-18 DIAGNOSIS — I441 Atrioventricular block, second degree: Secondary | ICD-10-CM | POA: Diagnosis present

## 2013-02-18 DIAGNOSIS — F0390 Unspecified dementia without behavioral disturbance: Secondary | ICD-10-CM

## 2013-02-18 LAB — URINALYSIS, ROUTINE W REFLEX MICROSCOPIC
Glucose, UA: NEGATIVE mg/dL
Protein, ur: NEGATIVE mg/dL
Specific Gravity, Urine: 1.009 (ref 1.005–1.030)

## 2013-02-18 LAB — BASIC METABOLIC PANEL
BUN: 35 mg/dL — ABNORMAL HIGH (ref 6–23)
Calcium: 9.5 mg/dL (ref 8.4–10.5)
Chloride: 105 mEq/L (ref 96–112)
Creatinine, Ser: 1.5 mg/dL — ABNORMAL HIGH (ref 0.50–1.10)
GFR calc Af Amer: 35 mL/min — ABNORMAL LOW (ref >90)
GFR calc non Af Amer: 30 mL/min — ABNORMAL LOW (ref >90)

## 2013-02-18 LAB — CBC WITH DIFFERENTIAL/PLATELET
Basophils Relative: 0 % (ref 0–1)
Eosinophils Absolute: 0 10*3/uL (ref 0.0–0.7)
Eosinophils Relative: 1 % (ref 0–5)
Hemoglobin: 10.9 g/dL — ABNORMAL LOW (ref 12.0–15.0)
Lymphs Abs: 2.2 10*3/uL (ref 0.7–4.0)
MCH: 31.6 pg (ref 26.0–34.0)
MCHC: 36.1 g/dL — ABNORMAL HIGH (ref 30.0–36.0)
MCV: 87.5 fL (ref 78.0–100.0)
Monocytes Absolute: 0.4 10*3/uL (ref 0.1–1.0)
Monocytes Relative: 8 % (ref 3–12)
Neutrophils Relative %: 47 % (ref 43–77)
RBC: 3.45 MIL/uL — ABNORMAL LOW (ref 3.87–5.11)

## 2013-02-18 LAB — URINE MICROSCOPIC-ADD ON

## 2013-02-18 MED ORDER — SODIUM CHLORIDE 0.9 % IJ SOLN
3.0000 mL | Freq: Two times a day (BID) | INTRAMUSCULAR | Status: DC
  Administered 2013-02-18 – 2013-02-22 (×5): 3 mL via INTRAVENOUS

## 2013-02-18 MED ORDER — ONDANSETRON HCL 4 MG PO TABS: 4.0000 mg | ORAL_TABLET | Freq: Four times a day (QID) | ORAL | Status: DC | PRN

## 2013-02-18 MED ORDER — ONDANSETRON HCL 4 MG/2ML IJ SOLN: 4.0000 mg | Freq: Four times a day (QID) | INTRAMUSCULAR | Status: DC | PRN

## 2013-02-18 MED ORDER — ASPIRIN 81 MG PO CHEW
81.0000 mg | CHEWABLE_TABLET | Freq: Every day | ORAL | Status: DC
  Administered 2013-02-18 – 2013-02-22 (×5): 81 mg via ORAL
  Filled 2013-02-18 (×5): qty 1

## 2013-02-18 MED ORDER — HEPARIN SODIUM (PORCINE) 5000 UNIT/ML IJ SOLN
5000.0000 [IU] | Freq: Three times a day (TID) | INTRAMUSCULAR | Status: DC
  Administered 2013-02-18 – 2013-02-22 (×12): 5000 [IU] via SUBCUTANEOUS
  Filled 2013-02-18 (×14): qty 1

## 2013-02-18 MED ORDER — ACETAMINOPHEN 650 MG RE SUPP: 650.0000 mg | Freq: Four times a day (QID) | RECTAL | Status: DC | PRN

## 2013-02-18 MED ORDER — DEXTROSE 5 % IV SOLN
1.0000 g | INTRAVENOUS | Status: DC
  Administered 2013-02-18 – 2013-02-19 (×2): 1 g via INTRAVENOUS
  Filled 2013-02-18 (×3): qty 10

## 2013-02-18 MED ORDER — ACETAMINOPHEN 325 MG PO TABS
650.0000 mg | ORAL_TABLET | Freq: Four times a day (QID) | ORAL | Status: DC | PRN
  Administered 2013-02-21: 650 mg via ORAL
  Filled 2013-02-18: qty 2

## 2013-02-18 MED ORDER — SODIUM CHLORIDE 0.9 % IV SOLN
INTRAVENOUS | Status: DC
  Administered 2013-02-18 – 2013-02-20 (×3): via INTRAVENOUS
  Administered 2013-02-21: 50 mL/h via INTRAVENOUS

## 2013-02-18 MED ORDER — BISACODYL 10 MG RE SUPP: 10.0000 mg | Freq: Every day | RECTAL | Status: DC | PRN

## 2013-02-18 NOTE — Progress Notes (Signed)
Patient ID: Kathleen [name]    DOB: 15-Apr-1923, 79 y.o.   MRN: [id number] --- Subjective:  Kathleen [name] is a 79 y.o.female with h/o CKD stage 3, dementia, anemia, HTN, who was evaluated at [facility] for fatigue and hypothermia.  Her daughter states that over the weekend, she was not acting like herself, not able to feed herself as well and more tired than usual. She had vitals at the nursing home this morning showing HR of 46 and BP: 96/54.  Later today, repeat vitals showed T: 93 rectal (with confirmed repeat rectal temperature) and 92.6 oral.  She denies any abdominal pain, any shortness of breath, chest pain. Last recorder bowel movement was last Friday. Per nursing staff, she has not urinated today.  She denies any recent dysuria, vomiting or nausea. She is incontinent of urine and wears a diaper.  ROS: see HPI  Past Medical History: reviewed and updated medications and allergies.  Social History: Tobacco: none   Objective:  BP: 118/56, HR: 50, O2: 100%, T: 93.4 rectal  Physical Examination:  General appearance - alert, well appearing, and in no distress, alert and oriented to person and time but not place.  Nose - normal and patent, no erythema or discharge  Mouth - dry mucous membranes, normal oropharynx  Chest - clear to auscultation, no wheezes, rales or rhonchi, symmetric air entry  Heart - bradycardic, normal rhythm, S1S2, 3/6 systolic murmur best heard at the right sternal border.  Abdomen - non tender, present bowel sounds, bladder dome appreciated.  Extremities - peripheral pulses normal, trace pitting pedal edema  Neuro - CN2-12 grossly intact, 4/5 grip strength bilaterally  Labs:  BMP from 02/15/13:  Na: 144, K: 4.5, CO2: 31, Chl: 107, glucose: 75, BUN: 36, Cr: 1.61, Calcium: 9.8  CBC: 02/15/13: WBC: 3.8 normal diff, Hg: 9.7, Hct: 27.6, MCV: 87.6, platelet: 154  Urine culture: 02/15/13: no growth

## 2013-02-18 NOTE — ED Provider Notes (Addendum)
CSN: [id number]     Arrival date & time 02/18/13  1453 History     First MD Initiated Contact with Patient 02/18/13 1504     Chief Complaint  Patient presents with  . Cold Exposure  . Bradycardia    HPI   Patient presents from extended care facility. Information I have relayed from EMS is that she was hypothermic during her evaluation today. Has history of dementia. She offers no specific complaints. Specifically she denies feeling ill weak dizzy pain. Temperature was 93.6.  Patient is followed  At  family practice clinic Past Medical History  Diagnosis Date  . Hypertension   . Arthritis   . Spinal stenosis   . Osteoarthritis   . Anemia   . CHF (congestive heart failure)   . Heart murmur     aortic regurg  . Osteoporosis   . Chronic kidney disease   . Generalized OA 12/16/2011    Significant. Seems to have encompass all joints. Patient is complaining of significant pain but dementia allows patient to be happy.   History reviewed. No pertinent past surgical history. Family History  Problem Relation Age of Onset  . Hypertension Daughter    History  Substance Use Topics  . Smoking status: Never Smoker   . Smokeless tobacco: Not on file  . Alcohol Use: No   OB History   Grav Para Term Preterm Abortions TAB SAB Ect Mult Living                 Review of Systems  Unable to perform ROS: Dementia    Allergies  Review of patient's allergies indicates no known allergies.  Home Medications   Current Outpatient Rx  Name  Route  Sig  Dispense  Refill  . aspirin 81 MG tablet   Oral   Take 81 mg by mouth daily.           . calcium-vitamin D (OSCAL WITH D) 500-200 MG-UNIT per tablet   Oral   Take 1 tablet by mouth 2 (two) times daily.         . carvedilol (COREG) 3.125 MG tablet   Oral   Take 1 tablet (3.125 mg total) by mouth 2 (two) times daily with a meal.   180 tablet   3   . docusate sodium (COLACE) 100 MG capsule   Oral   Take 100 mg by mouth daily.          . ferrous sulfate (FERROUSUL) 325 (65 FE) MG tablet   Oral   Take 325 mg by mouth 2 (two) times daily.          Marland Kitchen lisinopril (PRINIVIL,ZESTRIL) 5 MG tablet   Oral   Take 5 mg by mouth daily.         . Multiple Vitamins-Minerals (SENIOR MULTIVITAMIN PLUS) TABS   Oral   Take 1 tablet by mouth 1 dose over 24 hours.   100 tablet   12   . Nutritional Supplement LIQD   Oral   Take 60 mLs by mouth daily. MED PASS         . psyllium (METAMUCIL SMOOTH TEXTURE) 28 % packet   Oral   Take 1 packet by mouth daily with breakfast.         . traMADol (ULTRAM) 50 MG tablet   Oral   Take 50 mg by mouth every 12 (twelve) hours as needed for pain.          BP 117/61  Pulse 62  Temp(Src) 94.5 F (34.7 C) (Other (Comment))  Resp 18  SpO2 100% Physical Exam  Constitutional: She is oriented to person, place, and time. She appears well-developed and well-nourished. No distress.  HENT:  Head: Normocephalic.  Eyes: Conjunctivae are normal. Pupils are equal, round, and reactive to light. No scleral icterus.  Neck: Normal range of motion. Neck supple. No thyromegaly present.  Cardiovascular: S1 normal.  An irregularly irregular rhythm present. Bradycardia present.  Exam reveals no gallop, no distant heart sounds and no friction rub.   No murmur heard. Irregular pulse.  Prolonged P-R.  Pulmonary/Chest: Effort normal and breath sounds normal. No respiratory distress. She has no decreased breath sounds. She has no wheezes. She has no rhonchi. She has no rales.  Abdominal: Soft. Bowel sounds are normal. She exhibits no distension. There is no tenderness. There is no rebound.  Musculoskeletal: Normal range of motion.  Neurological: She is alert and oriented to person, place, and time. She has normal strength. No cranial nerve deficit or sensory deficit.  Skin: Skin is warm and dry. No rash noted.  Psychiatric: She has a normal mood and affect. Her behavior is normal.    ED Course     EKG. Sinus bradycardia. Rate 55. No ST changes. No ectopy.  Procedures (including critical care time)  Labs Reviewed  CBC WITH DIFFERENTIAL - Abnormal; Notable for the following:    RBC 3.45 (*)    Hemoglobin 10.9 (*)    HCT 30.2 (*)    MCHC 36.1 (*)    All other components within normal limits  BASIC METABOLIC PANEL - Abnormal; Notable for the following:    BUN 35 (*)    Creatinine, Ser 1.50 (*)    GFR calc non Af Amer 30 (*)    GFR calc Af Amer 35 (*)    All other components within normal limits  URINALYSIS, ROUTINE W REFLEX MICROSCOPIC - Abnormal; Notable for the following:    Hgb urine dipstick TRACE (*)    Leukocytes, UA TRACE (*)    All other components within normal limits  CULTURE, BLOOD (ROUTINE X 2)  CULTURE, BLOOD (ROUTINE X 2)  URINE CULTURE  URINE MICROSCOPIC-ADD ON   Dg Chest Port 1 View  02/18/2013   *RADIOLOGY REPORT*  Clinical Data: Bradycardia and hypothermia  PORTABLE CHEST - 1 VIEW  Comparison:  September 29, 2010  Findings:  There is a 1.3 91.2 cm nodular appearing opacity in the superior right hilar region.  Elsewhere, lungs are clear.  Heart is mildly enlarged with normal pulmonary vascularity. There is no appreciable adenopathy.  There is atherosclerotic change in the aorta.  There is advanced erosive change in the proximal humerus with remodelling of the right glenoid.  There is moderate osteoarthritic change in the left shoulder.  IMPRESSION: Question nodular lesion versus overlapping vascular structures of the superior right hilum. Noncontrast enhanced chest CT advised to further evaluate this area.  Lungs elsewhere clear.  Heart is mildly enlarged but stable.  Advanced osteo necrosis and remodelling in the right shoulder region.  There may be some frank bony destruction in this area.  CT through this area with particular attention to the right glenoid and may well be advisable with respect to potential underlying osteomyelitis.   Original Report Authenticated  By: Bretta Bang, M.D.   1. Bradycardia     MDM  Initial evaluation reveals hypothermia and regular bradycardic pulse. She is on Coreg no other medications that could be a  culprit for bradycardia. Evaluation before sepsis electrolyte abnormality EKG is pending she is not show signs of being hypotensive or congestive heart failure.  The patient remained awake alert lucid dementia family has arrived. Family states he seemed not herself over the weekend she was less active and weak. She remains hypothermic. TSH is pending. No sign of infection on her houses for chest x-ray. Plan admission rewarming hold Coreg observe for symptoms related to the bradycardia. Discussed with the resident and admitted him to the residents and Dr. Leveda Anna.  Claudean Kinds, MD 02/18/13 1836  Claudean Kinds, MD 02/18/13 Paulo Fruit  Claudean Kinds, MD 02/23/13 1101

## 2013-02-18 NOTE — Assessment & Plan Note (Addendum)
Hypothermia: unclear etiology. Infectious vs thyroid. Patient well appearing.  Will send to the ED for further evaluation, including repeat temperature, blood and urine cultures (in and out cath done at nursing home yielding 200ccs), CBC and BMP.

## 2013-02-18 NOTE — ED Notes (Signed)
Bair Hugger on-- family at bedside.

## 2013-02-18 NOTE — Progress Notes (Signed)
Family Medicine Teaching [facility] Admission History and Physical Service Pager: [phone]  Patient name: Kathleen [name] Medical record number: [id number] Date of birth: Dec 02, 1922 Age: 79 y.o. Gender: female  Primary Care Provider: Gaspar Bidding, DO Consultants: none to date Code Status: full (verified at presentation)  Chief Complaint: bradycardia and hypothermia  Assessment and Plan: Kathleen SQUITIERI is a 79 y.o. year old female presenting with confusion/fatigue, bradycardia, and hypothermia (now improved). PMH is significant for dementia at baseline, CHF (uncertain EF), intermittent afib (only on ASA due to hx of bicep hematoma and general fall risk), generalized osteoarthritis, CKD stage III, hypertension. Hypothermia now improved and mental status approaching baseline, though still weak/tired. Bradycardia intermittent, occasional 20's-30's, occasional 60's.  #UTI - presumptive diagnosis in elderly incontinent female with mental status change, hypothermia, and UA findings -trace leukocytes, trace Hb on UA, urine culture pending -ordered for empiric CTX while awaiting culture [ ]  f/u urine culture  #Bradycardia, hx of paroxysmal a-fib - heart rate variable, 30's-60's; ?irregularity vs bigeminy on exam -HOLD home Coreg and lisinopril [ ]  f/u repeat EKG in the AM  #Hypothermia - uncertain cause, improved with Bair Hugger -treating empirically for UTI, as above, though temp improved prior to starting abx -monitor clinically, consider warming if needed  #Hypertension - variable as is heart rate, 120's/50's-160's/70's -hold Coreg and lisinopril, as above -monitor BP, consider slow re-addition of medications if needed, if BP persists high  #Acute encephalopathy in setting of dementia - mild increased confusion/lethargy, weakness in setting of possible UTI -uncertain etiology, possibly multifactorial with poor PO intake, ?infection, low body temp/bradycardia -monitor  mental status clinically, changes in management as above  #CKD stage III - Cr at baseline, around 1.5 -gentle IV hydration given poor PO intake -monitor as needed  #FEN/GI: NS at 50 mL/h, heart healthy diet #Prophylaxis: subQ heparin  Disposition: placing in observation, attending Dr. Leveda Anna (day attending Dr. Perley Jain)  -management as above; anticipate discharge back to [facility] when medically stable  -ordered for PT/OT and CSW to help facilitate recommendations/placement as needed  History of Present Illness: Kathleen [name] is a 79 y.o. year old female presenting with confusion/lethargia, bradycardia, and hypothermia (now improved). PMH is significant for dementia at baseline, CHF (uncertain EF), intermittent afib (only on ASA due to hx of bicep hematoma and general fall risk), generalized osteoarthritis, CKD stage III, hypertension.  Pt lives at [place]; daughter [name] is employed by [employer]. Per daughter, pt has been slightly more lethargic and weaker than normal over the last several days, with some intermittent confusion (dementia at baseline, but slightly more confused than normal; difficult to describe specifics, per daughter). Pt required some assistance feeding over the past couple of days, which is very different for her. Pt describes some vague increased urination but denies frank dysuria or pain; of note, pt is incontinent at baseline. Pt also states she is "breathing fine" and generally feels "okay." Denies frank chest pain, abdominal pain, though does endorse some poor appetite over the past several days.  In the ED, pt was noted to be bradycardic to the 50's with occasional dip to 30's, as well as hypothermia to 93.6, which improved with Bair Hugger to 98.1.  Review Of Systems: Per HPI; difficult to interpret in dementia. Otherwise 12 point review of systems was performed and was unremarkable.  Patient Active Problem List   Diagnosis Date  Noted  . Sepsis 02/19/2013  . Acute pyelonephritis 02/19/2013  . Acute encephalopathy  02/19/2013  . Body temperature low 02/18/2013  . Bradycardia 02/18/2013  . Irregular bowel habits 11/08/2012  . At high risk for falls 10/02/2012  . Anemia 02/24/2012  . Generalized OA 12/16/2011  . Person living in residential institution 12/16/2011  . Unspecified conditions influencing health status 12/16/2011  . Dementia 10/13/2010  . Paroxysmal atrial fibrillation 10/13/2010  . Hypertension 10/13/2010  . Kidney disease, chronic, stage III (GFR 30-59 ml/min) 10/13/2010  . Osteoporosis 10/13/2010  . CHF (congestive heart failure) 10/13/2010   Past Medical History: Past Medical History  Diagnosis Date  . Hypertension   . Arthritis   . Spinal stenosis   . Osteoarthritis   . Anemia   . CHF (congestive heart failure)   . Heart murmur     aortic regurg  . Osteoporosis   . Chronic kidney disease   . Generalized OA 12/16/2011    Significant. Seems to have encompass all joints. Patient is complaining of significant pain but dementia allows patient to be happy.   Past Surgical History: History reviewed. No pertinent past surgical history. Social History: History  Substance Use Topics  . Smoking status: Never Smoker   . Smokeless tobacco: Not on file  . Alcohol Use: No   Additional social history: Please also refer to relevant sections of EMR.  Family History: Family History  Problem Relation Age of Onset  . Hypertension Daughter    Allergies and Medications: No Known Allergies No current facility-administered medications on file prior to encounter.   Current Outpatient Prescriptions on File Prior to Encounter  Medication Sig Dispense Refill  . aspirin 81 MG tablet Take 81 mg by mouth daily.        . calcium-vitamin D (OSCAL WITH D) 500-200 MG-UNIT per tablet Take 1 tablet by mouth 2 (two) times daily.      . carvedilol (COREG) 3.125 MG tablet Take 1 tablet (3.125 mg total) by mouth  2 (two) times daily with a meal.  180 tablet  3  . docusate sodium (COLACE) 100 MG capsule Take 100 mg by mouth daily.      . ferrous sulfate (FERROUSUL) 325 (65 FE) MG tablet Take 325 mg by mouth 2 (two) times daily.       Marland Kitchen lisinopril (PRINIVIL,ZESTRIL) 5 MG tablet Take 5 mg by mouth daily.      . Multiple Vitamins-Minerals (SENIOR MULTIVITAMIN PLUS) TABS Take 1 tablet by mouth 1 dose over 24 hours.  100 tablet  12  . Nutritional Supplement LIQD Take 60 mLs by mouth daily. MED PASS      . psyllium (METAMUCIL SMOOTH TEXTURE) 28 % packet Take 1 packet by mouth daily with breakfast.       Objective: BP 152/63  Pulse 68  Temp(Src) 98.1 F (36.7 C) (Oral)  Resp 18  Ht 4\' 7"  (1.397 m)  Wt 117 lb 8 oz (53.298 kg)  BMI 27.31 kg/m2  SpO2 98% Exam: General: alert elderly female, in no acute distress, answers questions appropriately HEENT: [place]/AT, mucous membranes slightly dry, PERRLA, EOMI Cardiovascular: bradycardic, ?irregular rhythm vs bigeminy, 3/6 systolic murmur noted Respiratory: CTAB, though breath sounds faint and pt has poor inspiratory effort; no increased WOB Abdomen: soft, nontender, BS+ Extremities: moves all extremities equally/spontaneously, no LE edema noted Skin: warm, dry, intact, no rashes noted Neuro: grossly nonfocal, strength 4+/5 in all four extremities, awake/alert to person, place [facility], hospital")  Labs and Imaging: CBC BMET   Recent Labs Lab 02/18/13 1519  WBC 5.0  HGB 10.9*  HCT 30.2*  PLT 154    Recent Labs Lab 02/18/13 1519  NA 144  K 4.4  CL 105  CO2 31  BUN 35*  CREATININE 1.50*  GLUCOSE 78  CALCIUM 9.5     Urinalysis: trace leukocytes, trace Hb Micro:  Urine culture 8/11 @1546 : PENDING  Blood cultures 8/11 @1540  and 1620: PENDING  CXR - right hilar nodular opacity, lungs otherwise clear, mild cardiomegaly  -right shoulder osteonecrosis, potential frank bony destruction  -radiologist suggesting CT chest to evaluate nodule and  shoulder   Bobbye Morton, MD 02/19/2013, 12:43 AM PGY-2, [place] [facility] FPTS Intern pager: [phone], text pages welcome

## 2013-02-18 NOTE — ED Notes (Addendum)
TO ED via [facility] from [facility] with staff c/o hypothermic, and bradycardic. Was seen by MD today and sent here. Hx dementia, pt at baseline per nursing home staff

## 2013-02-18 NOTE — Assessment & Plan Note (Addendum)
Baseline heart rate in the 60's to 70's. Patient is on coreg 3.125mg  bid which was held this am. Hypothermia vs AV block. Send to ED for EKG and monitoring while she gets worked up for hypothermia.

## 2013-02-19 DIAGNOSIS — G9340 Encephalopathy, unspecified: Secondary | ICD-10-CM | POA: Diagnosis present

## 2013-02-19 DIAGNOSIS — N10 Acute tubulo-interstitial nephritis: Secondary | ICD-10-CM | POA: Diagnosis present

## 2013-02-19 LAB — BASIC METABOLIC PANEL
BUN: 35 mg/dL — ABNORMAL HIGH (ref 6–23)
Calcium: 8.9 mg/dL (ref 8.4–10.5)
Creatinine, Ser: 1.45 mg/dL — ABNORMAL HIGH (ref 0.50–1.10)
GFR calc Af Amer: 36 mL/min — ABNORMAL LOW (ref >90)
GFR calc non Af Amer: 31 mL/min — ABNORMAL LOW (ref >90)
Glucose, Bld: 83 mg/dL (ref 70–99)
Potassium: 4.6 mEq/L (ref 3.5–5.1)

## 2013-02-19 LAB — CBC
HCT: 27.8 % — ABNORMAL LOW (ref 36.0–46.0)
Hemoglobin: 10.1 g/dL — ABNORMAL LOW (ref 12.0–15.0)
MCH: 31.3 pg (ref 26.0–34.0)
MCHC: 36.3 g/dL — ABNORMAL HIGH (ref 30.0–36.0)
MCV: 86.1 fL (ref 78.0–100.0)
RDW: 13.5 % (ref 11.5–15.5)

## 2013-02-19 NOTE — Progress Notes (Signed)
Seen and examined.  Discussed with Dr. Casper Harrison.  Agree with admit and his documentation and management.  Briefly 79 yo female with known dementia presents with acute delirium, hypothermia and bradycardia.  Issues. 1. Hypothermia.  No clear explanation.  UTI/infection is the most like cause although the evidence for UTI is tenuous.  She responded to warming and is now maintaining her temp.  Seems to be returning to her baseline. 2. Bradycardia, likely 2nd to hypothermia.  Would monitor and not work up unless brady recurs when normothermic 3. Delirium on top dementia.  Seems to be clearing.  Most likely due to her medical problems.    Given underlying dementia, I would minimize workup and maximize comfort.  Hopefully, brief hospitalization and return to her residential SNF.

## 2013-02-19 NOTE — Evaluation (Signed)
Occupational Therapy Evaluation and Discharge Patient Details Name: Kathleen [name] MRN: [id number] DOB: [birth year] Today's Date: 02/19/2013 Time: 1191-4782 OT Time Calculation (min): 23 min  OT Assessment / Plan / Recommendation History of present illness 79 y.o. year old female presenting with confusion/fatigue, bradycardia, and hypothermia (now improved). PMH is significant for dementia at baseline, CHF (uncertain EF), intermittent afib (only on ASA due to hx of bicep hematoma and general fall risk), generalized osteoarthritis, CKD stage III, hypertension. Hypothermia now improved and mental status approaching baseline, though still weak/tired. Bradycardia intermittent, occasional 20's-30's, occasional 60's.   Clinical Impression   Pt admitted with above. The remainder of patient's needs can be met at SNF as they deem appropriate. Acute OT will sign off.      OT Assessment  All further OT needs can be met in the next venue of care    Follow Up Recommendations  SNF       Equipment Recommendations  None recommended by OT          Precautions / Restrictions Precautions Precautions: Fall Restrictions Weight Bearing Restrictions: No       ADL  Eating/Feeding: Set up Where Assessed - Eating/Feeding: Bed level Grooming: Set up;Supervision/safety Where Assessed - Grooming: Supported sitting Upper Body Bathing: Moderate assistance Where Assessed - Upper Body Bathing: Supported sitting Lower Body Bathing: Maximal assistance Where Assessed - Lower Body Bathing: Supported sit to stand Upper Body Dressing: Maximal assistance Where Assessed - Upper Body Dressing: Supported sitting Lower Body Dressing: +1 Total assistance Where Assessed - Lower Body Dressing: Supported sit to Pharmacist, hospital: Moderate assistance Toilet Transfer Method: Sit to Barista:  (Bed>to door, sit in recliner behind her) Therapist, nutritional and Hygiene: +1  Total assistance Where Assessed - Engineer, mining and Hygiene: Sit to stand from 3-in-1 or toilet Equipment Used: Rolling walker;Gait belt Transfers/Ambulation Related to ADLs: Min A sit<>stand, mod A ambulation with RW    OT Diagnosis: Generalized weakness;Cognitive deficits  OT Problem List: Decreased strength;Decreased range of motion;Impaired balance (sitting and/or standing);Impaired UE functional use;Decreased cognition    Visit Information  Last OT Received On: 02/19/13 Assistance Needed: +1 PT/OT Co-Evaluation/Treatment: Yes History of Present Illness: 79 y.o. year old female presenting with confusion/fatigue, bradycardia, and hypothermia (now improved). PMH is significant for dementia at baseline, CHF (uncertain EF), intermittent afib (only on ASA due to hx of bicep hematoma and general fall risk), generalized osteoarthritis, CKD stage III, hypertension. Hypothermia now improved and mental status approaching baseline, though still weak/tired. Bradycardia intermittent, occasional 20's-30's, occasional 60's.       Prior Functioning     Home Living Family/patient expects to be discharged to:: Skilled nursing facility Additional Comments: pt resides at [facility] Prior Function Level of Independence: Needs assistance Gait / Transfers Assistance Needed: short distance amb with RW with assist, uses w/c majority of the time ADL's / Homemaking Assistance Needed: reports she bathes/dresses self however suspect pt receives assist. Communication Communication: No difficulties Dominant Hand: Right         Vision/Perception Vision - History Patient Visual Report: No change from baseline   Cognition  Cognition Arousal/Alertness: Awake/alert Behavior During Therapy: WFL for tasks assessed/performed Overall Cognitive Status: History of cognitive impairments - at baseline (pt with dementia at baseline) Memory: Decreased short-term memory    Extremity/Trunk  Assessment Upper Extremity Assessment Upper Extremity Assessment: RUE deficits/detail;LUE deficits/detail RUE Deficits / Details: Arthritic changes all joints RUE Coordination: decreased fine motor;decreased gross motor LUE  Deficits / Details: Arthritic changes all joints LUE Coordination: decreased fine motor;decreased gross motor Lower Extremity Assessment Lower Extremity Assessment: Generalized weakness (noted bilat hallux valgus, increased stiffness) Cervical / Trunk Assessment Cervical / Trunk Assessment: Kyphotic     Mobility Bed Mobility Bed Mobility: Supine to Sit Supine to Sit: 4: Min guard;With rails;HOB elevated Details for Bed Mobility Assistance: def use of bed rail, v/c's to stop sliding to EOB Transfers Sit to Stand: 4: Min assist;With upper extremity assist Stand to Sit: 4: Min assist;With upper extremity assist;To chair/3-in-1 Details for Transfer Assistance: v/c's not to pull up on RW - pt with poor comprehension. increased time, posterior lean        Balance Balance Balance Assessed: Yes Static Sitting Balance Static Sitting - Balance Support: Bilateral upper extremity supported;Feet supported Static Sitting - Level of Assistance: 5: Stand by assistance Static Sitting - Comment/# of Minutes: pt with + post lean when asked to complete LAQ Static Standing Balance Static Standing - Balance Support: Bilateral upper extremity supported Static Standing - Level of Assistance: 4: Min assist Static Standing - Comment/# of Minutes: def use of RW, sig. trunk flex   End of Session OT - End of Session Equipment Utilized During Treatment: Gait belt;Rolling walker Activity Tolerance: Patient tolerated treatment well Patient left: in chair;with call bell/phone within reach Nurse Communication: Mobility status  GO Functional Assessment Tool Used: Clinical observation Functional Limitation: Self care Self Care Current Status (W0981): At least 80 percent but less than 100  percent impaired, limited or restricted Self Care Goal Status (X9147): At least 80 percent but less than 100 percent impaired, limited or restricted Self Care Discharge Status [phone]): At least 80 percent but less than 100 percent impaired, limited or restricted   Evette Georges [phone] 02/19/2013, 9:25 AM

## 2013-02-19 NOTE — Progress Notes (Signed)
Utilization Review Completed.   Mckaylah Bettendorf, RN, BSN Nurse Case Manager  [phone]  

## 2013-02-19 NOTE — Evaluation (Signed)
Physical Therapy Evaluation Patient Details Name: Kathleen [name] MRN: [id number] DOB: 08-28-1922 Today's Date: 02/19/2013 Time: 7829-5621 PT Time Calculation (min): 26 min  PT Assessment / Plan / Recommendation History of Present Illness  79 y.o. year old female presenting with confusion/fatigue, bradycardia, and hypothermia (now improved). PMH is significant for dementia at baseline, CHF (uncertain EF), intermittent afib (only on ASA due to hx of bicep hematoma and general fall risk), generalized osteoarthritis, CKD stage III, hypertension. Hypothermia now improved and mental status approaching baseline, though still weak/tired. Bradycardia intermittent, occasional 20's-30's, occasional 60's.  Clinical Impression  Suspect pt functioning near baseline. Pt requires assist for all mobility and ADLs. Pt appropriate for return to SNF once medically stable.    PT Assessment  Patient needs continued PT services    Follow Up Recommendations  SNF    Does the patient have the potential to tolerate intense rehabilitation      Barriers to Discharge        Equipment Recommendations  None recommended by PT    Recommendations for Other Services     Frequency Min 2X/week    Precautions / Restrictions Precautions Precautions: Fall Restrictions Weight Bearing Restrictions: No   Pertinent Vitals/Pain Pt denies pain      Mobility  Bed Mobility Bed Mobility: Supine to Sit Supine to Sit: 4: Min guard;With rails;HOB elevated Details for Bed Mobility Assistance: def use of bed rail, v/c's to stop sliding to EOB Transfers Transfers: Sit to Stand;Stand to Sit Sit to Stand: 4: Min assist;With upper extremity assist Stand to Sit: 4: Min assist;With upper extremity assist;To chair/3-in-1 Details for Transfer Assistance: v/c's not to pull up on RW - pt with poor comprehension. increased time, posterior lean Ambulation/Gait Ambulation/Gait Assistance: 3: Mod assist Ambulation Distance  (Feet): 12 Feet Assistive device: Rolling walker Ambulation/Gait Assistance Details: significant increased trunk flexion, crossover gait pattern, minA for walker management Gait Pattern: Step-through pattern;Narrow base of support Gait velocity: slow General Gait Details: very unsteady Stairs: No    Exercises     PT Diagnosis: Difficulty walking  PT Problem List: Decreased strength;Decreased activity tolerance;Decreased balance;Decreased mobility PT Treatment Interventions: Gait training;Functional mobility training;Therapeutic activities;Therapeutic exercise     PT Goals(Current goals can be found in the care plan section) Acute Rehab PT Goals Patient Stated Goal: did not state PT Goal Formulation: Patient unable to participate in goal setting Time For Goal Achievement: 03/05/13 Potential to Achieve Goals: Fair  Visit Information  Last PT Received On: 02/19/13 Assistance Needed: +1 History of Present Illness: 79 y.o. year old female presenting with confusion/fatigue, bradycardia, and hypothermia (now improved). PMH is significant for dementia at baseline, CHF (uncertain EF), intermittent afib (only on ASA due to hx of bicep hematoma and general fall risk), generalized osteoarthritis, CKD stage III, hypertension. Hypothermia now improved and mental status approaching baseline, though still weak/tired. Bradycardia intermittent, occasional 20's-30's, occasional 60's.       Prior Functioning  Home Living Family/patient expects to be discharged to:: Skilled nursing facility Additional Comments: pt resides at [facility] Prior Function Level of Independence: Needs assistance Gait / Transfers Assistance Needed: short distance amb with RW with assist, uses w/c majority of the time ADL's / Homemaking Assistance Needed: reports she bathes/dresses self however suspect pt receives assist. Communication Communication: No difficulties Dominant Hand: Right    Cognition   Cognition Arousal/Alertness: Awake/alert Behavior During Therapy: WFL for tasks assessed/performed Overall Cognitive Status: History of cognitive impairments - at baseline (pt with dementia at baseline) Memory:  Decreased short-term memory    Extremity/Trunk Assessment Upper Extremity Assessment Upper Extremity Assessment: Generalized weakness (noted arthritis in shouldes and hands/digits) Lower Extremity Assessment Lower Extremity Assessment: Generalized weakness (noted bilat hallux valgus, increased stiffness) Cervical / Trunk Assessment Cervical / Trunk Assessment: Kyphotic   Balance Balance Balance Assessed: Yes Static Sitting Balance Static Sitting - Balance Support: Bilateral upper extremity supported;Feet supported Static Sitting - Level of Assistance: 5: Stand by assistance Static Sitting - Comment/# of Minutes: pt with + post lean when asked to complete LAQ Static Standing Balance Static Standing - Balance Support: Bilateral upper extremity supported Static Standing - Level of Assistance: 4: Min assist Static Standing - Comment/# of Minutes: def use of RW, sig. trunk flex  End of Session PT - End of Session Equipment Utilized During Treatment: Gait belt Activity Tolerance: Patient tolerated treatment well Patient left: in chair;with call bell/phone within reach Nurse Communication: Mobility status  GP Functional Assessment Tool Used: clinical judgement Functional Limitation: Mobility: Walking and moving around Mobility: Walking and Moving Around Current Status [phone]): At least 40 percent but less than 60 percent impaired, limited or restricted Mobility: Walking and Moving Around Goal Status [phone]): At least 20 percent but less than 40 percent impaired, limited or restricted   Marcene Brawn 02/19/2013, 8:36 AM  Lewis Shock, PT, DPT Pager #: [phone] Office #: [phone]

## 2013-02-19 NOTE — Progress Notes (Signed)
Family Medicine Teaching Service Daily Progress Note Intern Pager: [phone]  Patient name: Kathleen [name] Medical record number: [id number] Date of birth: July 13, 1922 Age: 79 y.o. Gender: female  Primary Care Provider: Gaspar Bidding, DO Consultants: none Code Status: Full  Pt Overview and Major Events to Date:  8/11 - started on Rocephin - questionable UTI 8/12 - Rocephin Day 2 / improved mental status  Assessment and Plan:  Kathleen [name] is a 79 y.o. year old female presenting with confusion/fatigue, bradycardia, and hypothermia (now improved). PMH is significant for dementia at baseline, CHF (uncertain EF), intermittent afib (only on ASA due to hx of bicep hematoma and general fall risk), generalized osteoarthritis, CKD stage III, hypertension. Hypothermia now improved and mental status approaching baseline, though still weak/tired. Bradycardia intermittent, occasional 20's-30's, occasional 60's.  # UTI - presumptive diagnosis in elderly incontinent female with mental status change, hypothermia, and UA findings  -trace leukocytes, trace Hb on UA, urine culture pending  -ordered for empiric Rocephin while awaiting culture  - continue Rocephin (Day 2) [ ]  f/u urine cx  # Bradycardia - Improved hx of paroxysmal a-fib - heart rate variable, 30's-60's; - irregularity vs bigeminy on exam  - continue to HOLD home Coreg and lisinopril  - EKG unchanged (8/12) HR stable today 58 - 73  # Acute encephalopathy in setting of chronic dementia - presented with mild increased confusion/lethargy, weakness in setting of possible UTI  -uncertain etiology, possibly multifactorial with poor PO intake, ?infection, low body temp/bradycardia  -monitor mental status clinically, changes in management as above - mental status appears to be waxing and waning delirium, mostly inattentive w/ difficulty focusing and following conversation. Pt is mostly alert and oriented to location, person, month /  year, but again orientation and focus changes - per team members that are familiar with pt's mental status baseline, she is near baseline  #Hypertension - variable as is heart rate, 120's/50's-160's/70's  -hold Coreg and lisinopril, as above  -monitor BP, consider slow re-addition of medications if needed, if BP persists high (8/12) BP stable, 120s-160 / 60-70s  #CKD stage III - Cr at baseline, around 1.5  -gentle IV hydration given poor PO intake  -monitor as needed - today Cr 1.45  # Hypothermia - resolved - uncertain cause, improved with Bair Hugger  -treating empirically for UTI, as above, though temp improved prior to starting abx  -monitor clinically, consider warming if needed  # FEN/GI: NS at 50 mL/h, heart healthy diet  # Prophylaxis: subQ heparin  Disposition: Return to [facility]) when medically stable, likely tomorrow (8/13)  Subjective: Today patient is sitting in chair at bedside. She is pleasant and engages in conversation easily, although she lacks focus and has difficulty with eye contact and answering all questions appropriately. She does provide some accurate historical data as to how she got into the hospital. Overall, denies any further complaints. However, due to her acute worsening mental status with chronic dementia, her history is unreliable.  ROS - unreliable. Does not appear in distress, pain, or discomfort.  Objective: Temp:  [95 F (35 C)-98.1 F (36.7 C)] 97.6 F (36.4 C) (08/12 1331) Pulse Rate:  [25-73] 62 (08/12 1331) Resp:  [15-19] 18 (08/12 1331) BP: (120-163)/(55-70) 120/59 mmHg (08/12 1331) SpO2:  [96 %-100 %] 96 % (08/12 1331) Weight:  [117 lb 1 oz (53.1 kg)-117 lb 8 oz (53.298 kg)] 117 lb 1 oz (53.1 kg) (08/12 2841) Physical Exam: General: pleasant elderly female, NAD Cardiovascular: irregular rhythm  on auscultation, 2-3/6 systolic murmur on exam loudest at sternal border Respiratory: CTAB, no appreciated wheezes / crackles. poor  respiratory effort, does not appear respiratory distress, no increased work of breathing Abdomen: soft, nontender +BS Extremities: able to move all ext, no edema, non-tender Neuro - non-focal on exam, muscle str 4/5 bilaterally ext Psych - alert, oriented to person (name), place [facility]), month (August), able to follow commands, and answers questions appropriately, noted difficulty focusing and following conversation. Some tangential thoughts. Coherent speech  Laboratory:  Recent Labs Lab 02/18/13 1519 02/19/13 0530  WBC 5.0 5.6  HGB 10.9* 10.1*  HCT 30.2* 27.8*  PLT 154 144*    Recent Labs Lab 02/18/13 1519 02/19/13 0530  NA 144 145  K 4.4 4.6  CL 105 109  CO2 31 25  BUN 35* 35*  CREATININE 1.50* 1.45*  CALCIUM 9.5 8.9  GLUCOSE 78 83   TSH 2.778  8/11 UA trace leuks, negative nitrites, WBC 0-2, RBC 0-2, pending culture   Imaging/Diagnostic Tests:  8/11 Portable CXR Question nodular lesion versus overlapping vascular structures of  the superior right hilum. Noncontrast enhanced chest CT advised to  further evaluate this area. Lungs elsewhere clear. Heart is  mildly enlarged but stable.  Advanced osteo necrosis and remodelling in the right shoulder  region. There may be some frank bony destruction in this area. CT  through this area with particular attention to the right glenoid  and may well be advisable with respect to potential underlying  osteomyelitis.  Saralyn Pilar, DO 02/19/2013, 6:14 PM PGY-1, [place] [facility] FPTS Intern pager: [phone], text pages welcome

## 2013-02-19 NOTE — Progress Notes (Signed)
Pt. Restless and agitated. Attempting to exit bed unassisted. Pt. Is confused and hallucinating. On call MD for family medicine made aware. Awaiting new orders. RN at the bedside with pt. Bed alarm on. Call light within reach. RN will continue to monitor pt. Kaneesha Constantino, Cheryll Dessert

## 2013-02-20 ENCOUNTER — Encounter (HOSPITAL_COMMUNITY): Payer: Self-pay | Admitting: General Practice

## 2013-02-20 LAB — URINE CULTURE

## 2013-02-20 LAB — MAGNESIUM: Magnesium: 1.8 mg/dL (ref 1.5–2.5)

## 2013-02-20 NOTE — Progress Notes (Signed)
Pt. Alert and oriented to self. Resting in bed. Sitter at bedside. Pt. Continues to attempt to exit bed unassisted. Pt. With hx. Of dementia and alzheimers. RN reoriented pt. To place and time. Pt. Remains confused. RN will continue to monitor pt. For changes in condition. Adisyn Ruscitti, Cheryll Dessert'

## 2013-02-20 NOTE — Progress Notes (Signed)
Family Medicine Teaching Service Daily Progress Note Intern Pager: [phone]  Patient name: Kathleen [name] Medical record number: [id number] Date of birth: 31-Aug-1922 Age: 79 y.o. Gender: female  Primary Care Provider: Gaspar Bidding, DO Consultants: none Code Status: Full  Pt Overview and Major Events to Date:  8/11 - started on Rocephin - questionable UTI 8/12 - Rocephin Day 2 / improved mental status 8/13 - DC'd Rocephin / mental status stable / daughter interested in [facility] (SNF)  Assessment and Plan:  Kathleen [name] is a 79 y.o. year old female presenting with confusion/fatigue, bradycardia, and hypothermia (now improved). PMH is significant for dementia at baseline, CHF (uncertain EF), intermittent afib (only on ASA due to hx of bicep hematoma and general fall risk), generalized osteoarthritis, CKD stage III, hypertension. Hypothermia now improved and mental status approaching baseline, though still weak/tired. Bradycardia intermittent, occasional 20's-30's, occasional 60's.  # Acute encephalopathy in setting of chronic dementia - presented with mild increased confusion/lethargy, weakness in setting of possible UTI  -uncertain etiology, possibly multifactorial with poor PO intake, ?infection, low body temp/bradycardia  -monitor mental status clinically, changes in management as above - mental status appears to be waxing and waning delirium, mostly inattentive w/ difficulty focusing and following conversation. Pt is mostly alert and oriented to location, person, month / year, but again orientation and focus changes - per team members that are familiar with pt's mental status baseline, she is near baseline  # Presumed Sepsis, unidentified source - Resolved - On admission - presumptive diagnosis in elderly incontinent female with mental status change, hypothermia, and UA findings -trace leukocytes, trace Hb on UA, urine culture (ultimately showed yeast, >20k  CFU) -ordered for empiric Rocephin (coverage for likely UTI) while awaiting culture - ruled out UTI etiology - urine culture negative - DC'd Rocephin today (8/13) - Hypothermia resolved w/ warming therapy - Hypotension resolved w/ fluid resuscitation  # Bradycardia - Improved hx of paroxysmal a-fib - heart rate variable, 30's-60's; - irregularity vs bigeminy on exam  - continue to HOLD home Coreg and lisinopril  - last EKG showed - 2nd Degree AV Block Type 1 - continue to HOLD Carvedilol due to possible risk of further conduction delay with beta blocker. Would advise HOLDING this on discharge as well, and follow-up outpatient  #Hypertension - variable as is heart rate, 120's/50's-160's/70's  -hold Coreg and lisinopril, as above  -monitor BP, consider slow re-addition of medications if needed, if BP persists high (8/13) BP stable, 110-120s / 60-70s  #CKD stage III - Cr at baseline, around 1.5  -gentle IV hydration given poor PO intake  -monitor as needed - stable last Cr 1.45  # Hypothermia - resolved - uncertain cause, improved with Bair Hugger  -treating empirically for UTI, as above, though temp improved prior to starting abx  -monitor clinically, consider warming if needed  # FEN/GI: NS at 50 mL/h, heart healthy diet  # Prophylaxis: subQ heparin  Disposition: Discharge to SNF pending family decision and approval, daughter interested in [facility] (SNF), patient will likely be medically stable tomorrow (8/14)  Subjective: O/N: patient was increasingly agitated, confused, attempted to get out of bed unassisted Today patient is sitting in chair at bedside. Pleasant, responses are mostly appropriate, some difficulty following conversation. Disoriented to month, date and year. Denies any further complaints. States that she is no longer cold and is feeling less weak.  ROS - unreliable. Does not appear in distress, pain, or discomfort.  Objective: Temp:  [  97.6 F  (36.4 C)-97.8 F (36.6 C)] 97.6 F (36.4 C) (08/13 1325) Pulse Rate:  [64-107] 107 (08/13 1325) Resp:  [18] 18 (08/13 1325) BP: (112-124)/(51-69) 112/51 mmHg (08/13 1325) SpO2:  [96 %-97 %] 97 % (08/13 1325) Weight:  [115 lb 1.3 oz (52.2 kg)] 115 lb 1.3 oz (52.2 kg) (08/13 0429) Physical Exam: General: pleasant elderly female, NAD Cardiovascular: irregular rhythm on auscultation, 2-3/6 systolic murmur on exam loudest at sternal border Respiratory: CTAB, no appreciated wheezes / crackles. poor respiratory effort Abdomen: soft, nontender +BS Extremities: able to move all ext, no edema, non-tender Neuro - non-focal on exam, muscle str 4/5 bilaterally ext Psych - alert, oriented to person (name), place [facility]), month (August), able to follow commands, and answers questions appropriately, noted difficulty focusing and following conversation. Some tangential thoughts. Coherent speech  Laboratory:  Recent Labs Lab 02/18/13 1519 02/19/13 0530  WBC 5.0 5.6  HGB 10.9* 10.1*  HCT 30.2* 27.8*  PLT 154 144*    Recent Labs Lab 02/18/13 1519 02/19/13 0530  NA 144 145  K 4.4 4.6  CL 105 109  CO2 31 25  BUN 35* 35*  CREATININE 1.50* 1.45*  CALCIUM 9.5 8.9  GLUCOSE 78 83   TSH 2.778  8/11 UA trace leuks, negative nitrites, WBC 0-2, RBC 0-2, pending culture   Imaging/Diagnostic Tests:  8/11 Portable CXR Question nodular lesion versus overlapping vascular structures of  the superior right hilum. Noncontrast enhanced chest CT advised to  further evaluate this area. Lungs elsewhere clear. Heart is  mildly enlarged but stable.  Advanced osteo necrosis and remodelling in the right shoulder  region. There may be some frank bony destruction in this area. CT  through this area with particular attention to the right glenoid  and may well be advisable with respect to potential underlying  osteomyelitis.  Kathleen Pilar, DO 02/20/2013, 4:10 PM PGY-1, [facility]  Family Medicine FPTS Intern pager: [phone], text pages welcome

## 2013-02-20 NOTE — Progress Notes (Signed)
Clinical Social Work Department BRIEF PSYCHOSOCIAL ASSESSMENT 02/20/2013  Patient:  Kathleen [name], Kathleen [name]     Account Number:  [id number]     Admit date:  02/18/2013  Clinical Social Worker:  Hendricks Milo  Date/Time:  02/20/2013 03:22 PM  Referred by:  Physician  Date Referred:  02/20/2013 Referred for  SNF Placement   Other Referral:   Interview type:  Family Other interview type:    PSYCHOSOCIAL DATA Living Status:  FACILITY Admitted from facility:  [facility] Level of care:  Skilled Nursing Facility Primary support name:  Abundio Miu [phone] Primary support relationship to patient:  CHILD, ADULT Degree of support available:   Very Supportive.    CURRENT CONCERNS  Other Concerns:    SOCIAL WORK ASSESSMENT / PLAN CSW tried to meet with patient but patient could not clearly answer questions due to drowsiness. CSW contacted daughter to discuss SNF placement. Patient came from [facility] but daughter reported that she prefers another facility other then [facility]. Daughter reported that [facility] is there first choice. Patient was using long term medicaid at [facility] and should still have 100 medicare days left. CSW explained to daughter that patient can use 100 medicare days and then transition to long term medicaid. CSW contacted [facility] representative Alvino Chapel to repot a referral for placement.   Assessment/plan status:  Psychosocial Support/Ongoing Assessment of Needs Other assessment/ plan:   Information/referral to community resources:    PATIENT'S/FAMILY'S RESPONSE TO PLAN OF CARE: Daughter wants patient to go to [facility].

## 2013-02-20 NOTE — Progress Notes (Signed)
Clinical Social Work Department CLINICAL SOCIAL WORK PLACEMENT NOTE 02/20/2013  Patient:  Kathleen [name], Kathleen [name]  Account Number:  [id number] Admit date:  02/18/2013  Clinical Social Worker:  ,  Date/time:  02/20/2013 03:43 PM  Clinical Social Work is seeking post-discharge placement for this patient at the following level of care:   SKILLED NURSING   (*CSW will update this form in Epic as items are completed)   02/20/2013  Patient/family provided with [facility] Department of Clinical Social Work's list of facilities offering this level of care within the geographic area requested by the patient (or if unable, by the patient's family).  02/20/2013  Patient/family informed of their freedom to choose among providers that offer the needed level of care, that participate in Medicare, Medicaid or managed care program needed by the patient, have an available bed and are willing to accept the patient.  02/20/2013  Patient/family informed of [facility]' ownership interest in [facility], as well as of the fact that they are under no obligation to receive care at this facility.  PASARR submitted to EDS on 02/20/2013 PASARR number received from EDS on 02/20/2013  FL2 transmitted to all facilities in geographic area requested by pt/family on  02/20/2013 FL2 transmitted to all facilities within larger geographic area on   Patient informed that his/her managed care company has contracts with or will negotiate with  certain facilities, including the following:     Patient/family informed of bed offers received:   Patient chooses bed at  Physician recommends and patient chooses bed at    Patient to be transferred to  on   Patient to be transferred to facility by   The following physician request were entered in Epic:   Additional Comments: Daughter prefers [facility].

## 2013-02-20 NOTE — Clinical Documentation Improvement (Signed)
THIS DOCUMENT IS NOT A PERMANENT PART OF THE MEDICAL RECORD  Please update your documentation with the medical record to reflect your response to this query. If you need help knowing how to do this please call [phone].  02/20/13  Dr. Casper Harrison,  In a better effort to capture your patient's severity of illness, reflect appropriate length of stay and utilization of resources, a review of the patient medical record has revealed the following information:  " Patient Active Problem List Sepsis          02/19/2013" Documented in Dr. Timothy Lasso progress note 02/19/13  - Urine Culture - 20,000 Yeast  - Hypothermic and Encephalopathic on Admission            Based on your clinical judgment, please document in the progress notes and discharge summary if "Sepsis"  Was:  - Confirmed and Present on Admission  - Ruled out  - Unable to Clinically Determine  Reviewed: additional documentation in the medical record  -Please see Dr. Althea Charon' progress note from today, 8/13 -addendum forth coming with clarification re: the above -presumed sepsis from possible infection of unknown etiology -evidenced by hypothermia, hypotension, now improved  Bobbye Morton, MD PGY-2, [facility] Family Medicine 02/20/2013, 2:11 PM FPTS Service pager: [phone] (text pages welcome through [facility])

## 2013-02-21 ENCOUNTER — Encounter: Payer: Self-pay | Admitting: Family Medicine

## 2013-02-21 LAB — URINALYSIS W MICROSCOPIC + REFLEX CULTURE: Blood in Urine, dipstick: NEGATIVE

## 2013-02-21 MED ORDER — HALOPERIDOL 0.5 MG PO TABS
0.5000 mg | ORAL_TABLET | Freq: Once | ORAL | Status: AC
  Filled 2013-02-21: qty 1

## 2013-02-21 MED ORDER — CEPHALEXIN 500 MG PO CAPS
500.0000 mg | ORAL_CAPSULE | Freq: Two times a day (BID) | ORAL | Status: DC
  Administered 2013-02-21 – 2013-02-22 (×3): 500 mg via ORAL
  Filled 2013-02-21 (×4): qty 1

## 2013-02-21 MED ORDER — HALOPERIDOL LACTATE 5 MG/ML IJ SOLN
0.5000 mg | Freq: Once | INTRAMUSCULAR | Status: AC
  Administered 2013-02-21: 0.5 mg via INTRAMUSCULAR
  Filled 2013-02-21: qty 0.1

## 2013-02-21 MED ORDER — HALOPERIDOL LACTATE 5 MG/ML IJ SOLN
0.5000 mg | Freq: Once | INTRAMUSCULAR | Status: AC
  Filled 2013-02-21: qty 0.1

## 2013-02-21 MED ORDER — HYDRALAZINE HCL 20 MG/ML IJ SOLN: 5.0000 mg | Freq: Once | INTRAMUSCULAR | Status: DC

## 2013-02-21 MED ORDER — HALOPERIDOL 0.5 MG PO TABS
0.5000 mg | ORAL_TABLET | Freq: Once | ORAL | Status: AC
  Administered 2013-02-22: 0.5 mg via ORAL
  Filled 2013-02-21: qty 1

## 2013-02-21 MED ORDER — LISINOPRIL 5 MG PO TABS
5.0000 mg | ORAL_TABLET | Freq: Every day | ORAL | Status: DC
  Administered 2013-02-21 – 2013-02-22 (×2): 5 mg via ORAL
  Filled 2013-02-21 (×2): qty 1

## 2013-02-21 NOTE — Progress Notes (Signed)
BP 162/105 Text page to [facility] Medicine group....noted stated they would soon be making rounds. Pt does not have any hypertensive meds scheduled

## 2013-02-21 NOTE — Progress Notes (Signed)
Bed offer received from [facility]. This is the facility of choice by the daughter as she does not want patient to return to [facility].  Patient will have a new 100 day Medicare benefit period.  She current has a Recruitment consultant and facility cannot accept until she is free from sitter services for 24 hours.  CSW discussed with patient's nurse and with unit Charge nurse.  CSW will discuss with daughter in the a.m.   Patient has dementia and is confused.  Lorri Frederick. [place] P[place]  [phone]

## 2013-02-21 NOTE — Discharge Summary (Signed)
Family Medicine Teaching [facility] Discharge Summary  Patient name: Kathleen [name] Medical record number: [id number] Date of birth: 1923-04-06 Age: 79 y.o. Gender: female Date of Admission: 02/18/2013  Date of Discharge: 02/22/2013 Admitting Physician: Sanjuana Letters, MD  Primary Care Provider: Gaspar Bidding, DO Consultants: None  Indication for Hospitalization: Hypothermia, Bradycardia, Hypotension, and Acute Encephalopathy, in setting of chronic dementia. Presumed to be secondary to sepsis from possible pyelonephritis  Discharge Diagnoses/Problem List: Bradycardia - resolved Hypothermia - resolved Hypotension - resolved Presumed Sepsis, likely secondary to Pyelonephritis - resolved Pyelonephritis, confirmed via outside urine culture - resolved Acute Encephalopathy, likely secondary to sepsis - improved Chronic Dementia Hypertension Chronic Kidney Disease - Stage III  Disposition: Home  Discharge Condition: Stable  Brief Hospital Course:  Kathleen [name] is a 79 y.o. year old female presenting with confusion/fatigue, bradycardia, and hypothermia (now improved). PMH is significant for dementia at baseline, CHF (uncertain EF), intermittent afib (only on ASA due to hx of bicep hematoma and general fall risk), generalized osteoarthritis, CKD stage III, hypertension. Hypothermia now improved and mental status approaching baseline, though still weak/tired. Bradycardia intermittent, occasional 20's-30's, occasional 60's.  # Acute encephalopathy, in setting of chronic dementia - Improved  - presented with mild increased confusion/lethargy, weakness in setting of possible UTI  - uncertain etiology, possibly multifactorial with poor PO intake, infection, low body temp/bradycardia - After initial stabilization, patient remained mostly alert and oriented to person, location and sometimes month, but poorly recognized year, date, time. Per team members familiar with patient,  reported to be near mental status baseline - monitored mental status changes during hospitalization, noted waxing and waning delirium with increased agitation overnight, otherwise mostly inattentive symptoms with difficulty focusing and following conversation - overnight 8/14 and 8/15, patient experienced episodes of acute delirium with increased agitation, unable to be re-oriented, persistently attempted to unsafely get out of bed, concern for safety and falls, given Haldol 0.5mg  IM each night. Required sitters at bedside for safety - acute episodes of Delirium likely occurred due to known history of chronic dementia, leaving patient more susceptible to acute mental status decline with delirium triggered by minor insults  # Chronic Dementia, unknown etiology  - Probable mixed delirium / dementia, with waxing and waning episodes of awareness  # Presumed Sepsis, likely secondary to Pyelonephritis - Resolved  - On admission - presumptive diagnosis in elderly incontinent female with mental status change, hypothermia, and UA findings -trace leukocytes, trace Hb on UA, urine culture (ultimately showed yeast, >20k CFU)  -ordered for empiric Rocephin (coverage for likely UTI) while awaiting culture  - ruled out UTI etiology - urine culture negative - DC'd Rocephin (8/13)  - Hypothermia resolved w/ warming therapy  - Hypotension resolved w/ fluid resuscitation  # Pyelonephritis  (8/14) Received information from outside Cath urine culture [facility]) taken prior to current hospitalization, that showed WBC 5-10, 2+ bacteria, 5-10 RBCs - suspicious for Pyelonephritis in the setting of patient's earlier presentation of hypothermia and hypotension. Despite negative culture during this hospitalization, decided to cover with antibiotics - started Keflex 500mg  BID x 7 days - Final Day 02/28/13  # Bradycardia - Improved  hx of paroxysmal a-fib - heart rate variable, 30's-60's, improved significantly to 70-90s  (high in low 100s) - irregularity vs bigeminy on exam  - continued to HOLD home Coreg during hospitalization - last EKG showed - 2nd Degree AV Block Type 1 - continue to HOLD Carvedilol due to possible risk of further conduction delay  with beta blocker. Would advise HOLDING this on discharge as well, and follow-up outpatient  #Hypertension - monitored BP, variable as is heart rate, 120's/50's-160's/70's - initially held home Coreg and Lisinopril due to hypotension  - during hospitalization, BP stabilized within normal range, and then trended up as it increased while holding BP meds - transient high of 205/115, which improved shortly after - due to persistently elevated BPs, restarted home Lisinopril 5mg  daily - started new medication, Amlodipine 5mg  daily to continue on discharge. Re-evaluate BP at outpt follow-up  #CKD stage III  Cr at baseline, around 1.5  - gentle IV hydration given poor PO intake  - monitor as needed  - last Cr 1.45, stable at baseline  # Hypothermia - resolved  - uncertain cause, improved with Bair Hugger  - treating empirically for UTI, as above, though temp improved prior to starting abx  - monitor clinically, consider warming if needed  Issues for Follow Up:  Antibiotic Therapy (Pyelonephritis) - Complete course of Keflex 500mg  BID x 7 days total - Final Day 02/28/13  BP and HR monitoring - presented with hypotension and bradycardia 30-40s, currently improved. However, concern that patient has history of bradyarrhythmia, and likely a 2nd degree AV Block Type 1 pattern, which could cause Third Degree Heart Block if given medicines that slow heart AV conduction.  Medicine Changes   - Started Amlodipine 5mg  daily, follow-up BP and if tolerating.    - Holding Coreg 3.125mg  BID (previous home med) due to bradycardia in hospital. Would consider restarting this medicine if HR persistently elevated, >90s  Acute Delirium, with Chronic Dementia - Patient is highly  susceptible to episodes of acute delirium with significant agitation or with hypoactive inattentive symptoms. Important to frequently re-orient patient to location and family  Home Health vs Future SNF Decision - Evaluate current situation at home with regards to home health needs, and explore family plans to decide to go the route of Skilled Nursing Facility   Significant Procedures: None  Significant Labs and Imaging:   Recent Labs Lab 02/18/13 1519 02/19/13 0530 02/22/13 0928  WBC 5.0 5.6 9.0  HGB 10.9* 10.1* 10.9*  HCT 30.2* 27.8* 30.0*  PLT 154 144* 168    Recent Labs Lab 02/18/13 1519 02/19/13 0530 02/20/13 1356 02/22/13 0928  NA 144 145  --  144  K 4.4 4.6  --  4.2  CL 105 109  --  108  CO2 31 25  --  24  GLUCOSE 78 83  --  105*  BUN 35* 35*  --  23  CREATININE 1.50* 1.45*  --  1.58*  CALCIUM 9.5 8.9  --  9.0  MG  --   --  1.8  --    TSH 2.778  8/11 UA trace leuks, negative nitrites, WBC 0-2, RBC 0-2 8/11 Urine Culture - negative 8/11 Blood Culture (x2) - negative  8/11 Portable CXR  Question nodular lesion versus overlapping vascular structures of  the superior right hilum. Noncontrast enhanced chest CT advised to  further evaluate this area. Lungs elsewhere clear. Heart is  mildly enlarged but stable.  Advanced osteo necrosis and remodelling in the right shoulder  region. There may be some frank bony destruction in this area. CT  through this area with particular attention to the right glenoid  and may well be advisable with respect to potential underlying  osteomyelitis.  Outstanding Results: none  Discharge Medications:    Medication List    STOP taking these medications  carvedilol 3.125 MG tablet  Commonly known as:  COREG      TAKE these medications       amLODipine 5 MG tablet  Commonly known as:  NORVASC  Take 1 tablet (5 mg total) by mouth daily.     aspirin 81 MG tablet  Take 81 mg by mouth daily.     calcium-vitamin D  500-200 MG-UNIT per tablet  Commonly known as:  OSCAL WITH D  Take 1 tablet by mouth 2 (two) times daily.     cephALEXin 500 MG capsule  Commonly known as:  KEFLEX  Take 1 capsule (500 mg total) by mouth 2 (two) times daily.     docusate sodium 100 MG capsule  Commonly known as:  COLACE  Take 100 mg by mouth daily.     FERROUSUL 325 (65 FE) MG tablet  Generic drug:  ferrous sulfate  Take 325 mg by mouth 2 (two) times daily.     lisinopril 5 MG tablet  Commonly known as:  PRINIVIL,ZESTRIL  Take 5 mg by mouth daily.     NUTRITIONAL SUPPLEMENT Liqd  Take 60 mLs by mouth daily. MED PASS     psyllium 28 % packet  Commonly known as:  METAMUCIL SMOOTH TEXTURE  Take 1 packet by mouth daily with breakfast.     SENIOR MULTIVITAMIN PLUS Tabs  Take 1 tablet by mouth 1 dose over 24 hours.     traMADol 50 MG tablet  Commonly known as:  ULTRAM  Take 50 mg by mouth every 12 (twelve) hours as needed for pain.        Discharge Instructions: Please refer to Patient Instructions section of EMR for full details.  Patient was counseled important signs and symptoms that should prompt return to medical care, changes in medications, dietary instructions, activity restrictions, and follow up appointments.   Follow-Up Appointments:  Follow-up Information   Follow up with RIGBY, MICHAEL, DO On 03/06/2013. (Appt at 10:15)    Specialty:  Family Medicine   Contact information:   [address] [place] [address] [phone]       Saralyn Pilar, DO 02/24/2013, 11:45 AM PGY-1, [facility] Family Medicine

## 2013-02-21 NOTE — Clinical Documentation Improvement (Signed)
THIS DOCUMENT IS NOT A PERMANENT PART OF THE MEDICAL RECORD  Please update your documentation with the medical record to reflect your response to this query. If you need help knowing how to do this please call [phone].  02/21/13   Dr. Althea Charon,  In a better effort to capture your patient's severity of illness, reflect appropriate length of stay and utilization of resources, a review of the patient medical record has revealed the following information:   - (8/14) overnight pt experienced episode of acute delirium with agitation, unable to be re-oriented, given           Haldol 0.5mg  IM (would not take PO)   - Known history of Dementia   - Has required sitters at the bedside this admission   Based on your clinical judgment, please document in the progress notes and discharge summary if a  condition below provides greater specificity regarding the patient's dementia:  - Dementia, with a Behavioral Disturbance  - Other Condition [x]  Unable to clinically Determine - possible mixed delirium / dementia   In responding to this query please exercise your independent judgment.    The fact that a query is asked, does not imply that any particular answer is desired or expected.  Reviewed: additional documentation in the medical record -see Dr. Althea Charon' pending progress note for today 02/21/2013  Bobbye Morton, MD PGY-2, [facility] Family Medicine 02/21/2013, 2:58 PM FPTS Service pager: [phone] (text pages welcome through [facility])

## 2013-02-21 NOTE — Progress Notes (Signed)
I discussed with  Dr Karamalegos.  I agree with their plans documented in their progress note for today.  

## 2013-02-21 NOTE — Progress Notes (Signed)
Family Medicine Teaching Service Daily Progress Note Intern Pager: [phone]  Patient name: Kathleen [name] Medical record number: [id number] Date of birth: 01-09-1923 Age: 79 y.o. Gender: female  Primary Care Provider: Gaspar Bidding, DO Consultants: none Code Status: Full  Pt Overview and Major Events to Date:  8/11 - started on Rocephin - questionable UTI 8/12 - Rocephin Day 2 / improved mental status 8/13 - DC'd Rocephin / mental status stable / daughter interested in [facility] (SNF) 8/14 - pending confirmation and approval for transfer to [facility] (SNF) 8/14 - Discussion with patient's mother, and she explains wishes to take patient home for interim, and then search for alternative SNF options when ready  Assessment and Plan:  Kathleen [name] is a 78 y.o. year old female presenting with confusion/fatigue, bradycardia, and hypothermia (now improved). PMH is significant for dementia at baseline, CHF (uncertain EF), intermittent afib (only on ASA due to hx of bicep hematoma and general fall risk), generalized osteoarthritis, CKD stage III, hypertension. Hypothermia now improved and mental status approaching baseline, though still weak/tired. Bradycardia intermittent, occasional 20's-30's, occasional 60's.  # Acute encephalopathy, in setting of chronic dementia - Improved - presented with mild increased confusion/lethargy, weakness in setting of possible UTI  -uncertain etiology, possibly multifactorial with poor PO intake, ?infection, low body temp/bradycardia  -monitor mental status clinically, changes in management as above - mental status appears to be waxing and waning delirium, mostly inattentive w/ difficulty focusing and following conversation. Pt is mostly alert and oriented to location, person, month / year, but again orientation and focus changes - per team members that are familiar with pt's mental status baseline, she is near  baseline (8/14) overnight pt experienced episode of acute delirium with agitation, unable to be re-oriented, persistently attempted to get out of bed alone and nurses were concerned for safety and falls, given Haldol 0.5mg  IM (would not take PO) - acute episode of Delirium likely occurred due to known history of dementia, leaving patient more susceptible to acute delirium due to any further insult - patient has required sitters at bedside during this admission  # Chronic Dementia - Probable mixed delirium / dementia, with waxing and waning episodes - unknown etiology  # Presumed Sepsis, likely secondary to Pyelonephritis - Resolved - On admission - presumptive diagnosis in elderly incontinent female with mental status change, hypothermia, and UA findings -trace leukocytes, trace Hb on UA, urine culture (ultimately showed yeast, >20k CFU) -ordered for empiric Rocephin (coverage for likely UTI) while awaiting culture - ruled out UTI etiology - urine culture negative - DC'd Rocephin today (8/13) - Hypothermia resolved w/ warming therapy - Hypotension resolved w/ fluid resuscitation  # Pyelonephritis (8/14) Received information from outside Cath urine culture [facility] SNF) taken prior to current hospitalization, that showed WBC 5-10, 2+ bacteria, 5-10 RBCs - suspicious for Pyelonephritis in the setting of patient's earlier presentation of hypothermia and hypotension. - start Keflex 500mg  BID x 7 days  # Bradycardia - Improved hx of paroxysmal a-fib - heart rate variable, 30's-60's; - irregularity vs bigeminy on exam  - continue to HOLD home Coreg - last EKG showed - 2nd Degree AV Block Type 1 - continue to HOLD Carvedilol due to possible risk of further conduction delay with beta blocker. Would advise HOLDING this on discharge as well, and follow-up outpatient  #Hypertension - variable as is heart rate, 120's/50's-160's/70's  -hold Coreg and lisinopril, as above  -monitor BP, consider slow  re-addition of medications if  needed, if BP persists high (8/13) BP stable, 110-120s / 60-70s (8/14) BP increased 150-200 / 80-100 - restarted home Lisinopril 5mg  daily - will continue to monitor and consider adding alternative agents, such as possibly Amlodipine 2.5 or 5mg  daily, hesitant to restart beta-blocker (Coreg) due to hx of bradyarryhythmia  #CKD stage III - Cr at baseline, around 1.5  -gentle IV hydration given poor PO intake  -monitor as needed - stable last Cr 1.45  # Hypothermia - resolved - uncertain cause, improved with Bair Hugger  -treating empirically for UTI, as above, though temp improved prior to starting abx  -monitor clinically, consider warming if needed  # FEN/GI: NS at 50 mL/h, heart healthy diet  # Prophylaxis: subQ heparin  Disposition: Discharge to home vs SNF pending family decision and approval, daughter interested in [facility] (SNF), patient will likely be medically stable tomorrow (8/15) Need to discuss with PT regarding recommendations for home equipment needed if patient were to go home  Subjective: O/N: patient was increasingly agitated, confused, attempted to get out of bed unassisted, required Haldol 0.5 IM Today patient laying in bed and is pleasantly conversant. Her mental status appears to be at baseline and my discussion with her today was similar to yesterday. She remains disoriented to date and year. She insists that she has no further complaints, and feels comfortable.  ROS - unreliable. Does not appear in distress, pain, or discomfort.  Objective: Temp:  [97.6 F (36.4 C)-98.1 F (36.7 C)] 98.1 F (36.7 C) (08/14 9562) Pulse Rate:  [99-107] 99 (08/14 0608) Resp:  [18-24] 24 (08/14 0608) BP: (112-174)/(51-88) 174/88 mmHg (08/14 0608) SpO2:  [96 %-97 %] 96 % (08/14 0608) Weight:  [115 lb 15.4 oz (52.6 kg)] 115 lb 15.4 oz (52.6 kg) (08/14 1308) Physical Exam: General: pleasant elderly female, laying in bed,  NAD Cardiovascular: irregular rhythm on auscultation, 2-3/6 systolic murmur on exam loudest at sternal border Respiratory: CTAB, no appreciated wheezes / crackles. poor respiratory effort Abdomen: soft, nontender +BS Extremities: able to move all ext, no edema, non-tender Neuro - non-focal on exam, muscle str 4/5 bilaterally ext Psych - alert, oriented to person (name), place [facility]), able to follow commands, and answers questions appropriately, noted difficulty focusing and following conversation. Some tangential thoughts. Coherent speech This exam is unchanged from yesterday.  Laboratory:  Recent Labs Lab 02/18/13 1519 02/19/13 0530  WBC 5.0 5.6  HGB 10.9* 10.1*  HCT 30.2* 27.8*  PLT 154 144*    Recent Labs Lab 02/18/13 1519 02/19/13 0530  NA 144 145  K 4.4 4.6  CL 105 109  CO2 31 25  BUN 35* 35*  CREATININE 1.50* 1.45*  CALCIUM 9.5 8.9  GLUCOSE 78 83   TSH 2.778  8/11 UA trace leuks, negative nitrites, WBC 0-2, RBC 0-2, pending culture   Imaging/Diagnostic Tests:  8/11 Portable CXR Question nodular lesion versus overlapping vascular structures of  the superior right hilum. Noncontrast enhanced chest CT advised to  further evaluate this area. Lungs elsewhere clear. Heart is  mildly enlarged but stable.  Advanced osteo necrosis and remodelling in the right shoulder  region. There may be some frank bony destruction in this area. CT  through this area with particular attention to the right glenoid  and may well be advisable with respect to potential underlying  osteomyelitis.  Saralyn Pilar, DO 02/21/2013, 8:30 AM PGY-1, [facility] Family Medicine FPTS Intern pager: [phone], text pages welcome

## 2013-02-21 NOTE — Progress Notes (Signed)
Patient ID: Kathleen [name], female   DOB: 1923-02-07, 79 y.o.   MRN: [id number] Cath UA with micro and culture results entered into EPIC manually.  Results called to in patient team as patient is currently hospitalized. Plan to treat with keflex x 7 day course.

## 2013-02-22 ENCOUNTER — Other Ambulatory Visit: Payer: Self-pay

## 2013-02-22 DIAGNOSIS — I441 Atrioventricular block, second degree: Secondary | ICD-10-CM | POA: Diagnosis present

## 2013-02-22 DIAGNOSIS — N10 Acute tubulo-interstitial nephritis: Secondary | ICD-10-CM

## 2013-02-22 HISTORY — DX: Atrioventricular block, second degree: I44.1

## 2013-02-22 LAB — CBC WITH DIFFERENTIAL/PLATELET
Basophils Absolute: 0 10*3/uL (ref 0.0–0.1)
Basophils Relative: 0 % (ref 0–1)
Eosinophils Absolute: 0 10*3/uL (ref 0.0–0.7)
Eosinophils Relative: 0 % (ref 0–5)
HCT: 30 % — ABNORMAL LOW (ref 36.0–46.0)
MCHC: 36.3 g/dL — ABNORMAL HIGH (ref 30.0–36.0)
MCV: 86.2 fL (ref 78.0–100.0)
Monocytes Absolute: 0.9 10*3/uL (ref 0.1–1.0)
RDW: 14.2 % (ref 11.5–15.5)

## 2013-02-22 LAB — BASIC METABOLIC PANEL
BUN: 23 mg/dL (ref 6–23)
CO2: 24 mEq/L (ref 19–32)
Calcium: 9 mg/dL (ref 8.4–10.5)
Chloride: 108 mEq/L (ref 96–112)
Creatinine, Ser: 1.58 mg/dL — ABNORMAL HIGH (ref 0.50–1.10)

## 2013-02-22 MED ORDER — AMLODIPINE BESYLATE 5 MG PO TABS: 5.0000 mg | ORAL_TABLET | Freq: Every day | ORAL | Status: DC

## 2013-02-22 MED ORDER — AMLODIPINE BESYLATE 5 MG PO TABS
5.0000 mg | ORAL_TABLET | Freq: Every day | ORAL | Status: DC
  Administered 2013-02-22: 5 mg via ORAL
  Filled 2013-02-22: qty 1

## 2013-02-22 MED ORDER — CEPHALEXIN 500 MG PO CAPS: 500.0000 mg | ORAL_CAPSULE | Freq: Two times a day (BID) | ORAL | Status: DC

## 2013-02-22 NOTE — Progress Notes (Signed)
I have seen and examined this patient. I have discussed with Dr Althea Charon.  I agree with their findings and plans as documented in their progress note.  Pt's urine culture from NH growing Klebsiella sensitive to cephazolin.  Will complete treatment for uncomplicated pyelonephritis for 10 to 14 days total effective antibiotic therapy.

## 2013-02-22 NOTE — Progress Notes (Signed)
I discussed with  Dr Karamalegos.  I agree with their plans documented in their progress note for today.  

## 2013-02-22 NOTE — Progress Notes (Signed)
PT Cancellation Note  Patient Details Name: Kathleen [name] MRN: [id number] DOB: Feb 11, 1923   Cancelled Treatment:    Reason Eval/Treat Not Completed: Fatigue/lethargy limiting ability to participate (Pt asleep and unable to get pt to wake up for therapy.)   Comfort Iversen 02/22/2013, 3:45 PM   Jake Shark, PT DPT [phone]

## 2013-02-22 NOTE — Care Management Note (Signed)
    Page 1 of 2   02/22/2013     2:19:07 PM   CARE MANAGEMENT NOTE 02/22/2013  Patient:  Kathleen [name], Kathleen [name]   Account Number:  [id number]  Date Initiated:  02/22/2013  Documentation initiated by:  Tera Mater  Subjective/Objective Assessment:   79yo female admitted with Acute Encephalopathy. Pt. from [facility].     Action/Plan:   discharge planning   Anticipated DC Date:  02/22/2013   Anticipated DC Plan:  HOME W HOME HEALTH SERVICES  In-house referral  Clinical Social Worker      DC Planning Services  CM consult      Choice offered to / List presented to:  C-4 Adult Children        HH arranged  HH-1 RN  HH-2 PT  HH-3 OT  HH-6 SOCIAL WORKER      HH agency  [facility]   Status of service:  Completed, signed off Medicare Important Message given?   (If response is "NO", the following Medicare IM given date fields will be blank) Date Medicare IM given:   Date Additional Medicare IM given:    Discharge Disposition:  HOME W HOME HEALTH SERVICES  Per UR Regulation:  Reviewed for med. necessity/level of care/duration of stay  If discussed at Long Length of Stay Meetings, dates discussed:    Comments:  02/22/13 1400 In to speak with pt. about home health services, and pt. asleep.  PT had rec pt. to return to SNF, however daughter wants to take pt. back home instead. Spoke with daughter, Kathleen [name] [phone]), in reference to home health agency.  Daughter states they have had [place] home health, and were satisfied with their care.  TC to [place], representative from [place], to give referral for [facility] RN, PT, and CSW.  Faxed facesheet, history and physical, discharge summary, and HH orders for [facility] on 02/25/13 to [phone]).  Pt. to dc home with family today by private vehicle. Tera Mater, RN, BSN NCM [phone]

## 2013-02-22 NOTE — Progress Notes (Signed)
Family Medicine Teaching Service Daily Progress Note Intern Pager: [phone]  Patient name: Kathleen [name] Medical record number: [id number] Date of birth: 01-Apr-1923 Age: 79 y.o. Gender: female  Primary Care Provider: Gaspar Bidding, DO Consultants: none Code Status: Full  Pt Overview and Major Events to Date:  8/11 - started on Rocephin - questionable UTI 8/12 - Rocephin Day 2 / improved mental status 8/13 - DC'd Rocephin / mental status stable / daughter interested in [facility] (SNF) 8/14 - pending confirmation and approval for transfer to [facility] (SNF) 8/14 - Discussion with patient's mother, and she explains wishes to take patient home for interim, and then search for alternative SNF options when ready 8/15 - Family decides to take patient home with home health / BP elevated 143/88, tachy HR 107, afebrile 8/15 - VSS, BP and HR improved  Assessment and Plan:  [name] is a 79 y.o. year old female presenting with confusion/fatigue, bradycardia, and hypothermia (now improved). PMH is significant for dementia at baseline, CHF (uncertain EF), intermittent afib (only on ASA due to hx of bicep hematoma and general fall risk), generalized osteoarthritis, CKD stage III, hypertension. Hypothermia now improved and mental status approaching baseline, though still weak/tired. Bradycardia intermittent, occasional 20's-30's, occasional 60's.  # Acute encephalopathy, in setting of chronic dementia - Improved - presented with mild increased confusion/lethargy, weakness in setting of possible UTI  -uncertain etiology, possibly multifactorial with poor PO intake, ?infection, low body temp/bradycardia  -monitor mental status clinically, changes in management as above - mental status appears to be waxing and waning delirium, mostly inattentive w/ difficulty focusing and following conversation. Pt is mostly alert and oriented to location, person, month /  year, but again orientation and focus changes - per team members that are familiar with pt's mental status baseline, she is near baseline (8/14) overnight pt experienced episode of acute delirium with agitation, unable to be re-oriented, persistently attempted to get out of bed alone and nurses were concerned for safety and falls, given Haldol 0.5mg  IM (would not take PO) - acute episode of Delirium likely occurred due to known history of chronic dementia, leaving patient more susceptible to instances of acute delirium triggered by any insult - patient has required sitters at bedside during this admission (8/15) family decides to take patient home, with home health nursing - has all needed medical supplies and proper supervision  # Chronic Dementia, unknown etiology - Probable mixed delirium / dementia, with waxing and waning episodes of awareness  # Presumed Sepsis, likely secondary to Pyelonephritis - Resolved - On admission - presumptive diagnosis in elderly incontinent female with mental status change, hypothermia, and UA findings -trace leukocytes, trace Hb on UA, urine culture (ultimately showed yeast, >20k CFU) -ordered for empiric Rocephin (coverage for likely UTI) while awaiting culture - ruled out UTI etiology - urine culture negative - DC'd Rocephin (8/13) - Hypothermia resolved w/ warming therapy - Hypotension resolved w/ fluid resuscitation  # Pyelonephritis (8/14) Received information from outside Cath urine culture [facility]) taken prior to current hospitalization, that showed WBC 5-10, 2+ bacteria, 5-10 RBCs - suspicious for Pyelonephritis in the setting of patient's earlier presentation of hypothermia and hypotension. (8/15) continue Keflex 500mg  BID x 6 days - Final Day 02/28/13  # Bradycardia - Improved hx of paroxysmal a-fib - heart rate variable, 30's-60's; - irregularity vs bigeminy on exam  - continue to HOLD home Coreg - last EKG showed - 2nd Degree AV Block Type  1 -  continue to HOLD Carvedilol due to possible risk of further conduction delay with beta blocker. Would advise HOLDING this on discharge as well, and follow-up outpatient  #Hypertension - variable as is heart rate, 120's/50's-160's/70's  -hold Coreg and lisinopril, as above  -monitor BP, consider slow re-addition of medications if needed, if BP persists high (8/13) BP stable, 110-120s / 60-70s (8/14) BP increased 150-200 / 80-100 - restarted home Lisinopril 5mg  daily (8/15) BP still elevated 140-180s / 80s - improved - start Amlodipine 5mg  daily - hesitant to restart beta-blocker (Coreg) due to hx of bradyarryhythmia  #CKD stage III Cr at baseline, around 1.5  -gentle IV hydration given poor PO intake  -monitor as needed - stable at baseline last Cr 1.45  # Hypothermia - resolved - uncertain cause, improved with Bair Hugger  - treating empirically for UTI, as above, though temp improved prior to starting abx  - monitor clinically, consider warming if needed  # FEN/GI: NS at 50 mL/h, heart healthy diet  # Prophylaxis: subQ heparin  Disposition: Discharge to home today (8/15) medically stable Need to discuss with PT regarding recommendations for home equipment needed if patient were to go home  Subjective: Today patient was laying in bed with no complaints today. She is still confused, disoriented. Nurse in room reports she has not had any complaints. No overnight events.  ROS - unreliable. Does not appear in distress, pain, or discomfort.  Objective: Temp:  [98.2 F (36.8 C)-99.1 F (37.3 C)] 98.2 F (36.8 C) (08/15 1316) Pulse Rate:  [86-115] 95 (08/15 1316) Resp:  [18-22] 18 (08/15 1316) BP: (113-174)/(46-99) 113/46 mmHg (08/15 1316) SpO2:  [96 %-100 %] 96 % (08/15 1316) Weight:  [113 lb 1.5 oz (51.3 kg)] 113 lb 1.5 oz (51.3 kg) (08/15 0600) Physical Exam: General: pleasant elderly female, laying in bed, NAD Cardiovascular: irregular rhythm on auscultation, 2-3/6  systolic murmur on exam loudest at sternal border Respiratory: CTAB, no appreciated wheezes / crackles. poor respiratory effort Abdomen: soft, nontender +BS Extremities: able to move all ext, no edema, non-tender Neuro - non-focal on exam, muscle str 4/5 bilaterally ext Psych - alert and awake, disoriented to place time date year. Follows simple commands. Speech is mostly coherent, but short phrases, does ignore some stimuli  Laboratory:  Recent Labs Lab 02/18/13 1519 02/19/13 0530 02/22/13 0928  WBC 5.0 5.6 9.0  HGB 10.9* 10.1* 10.9*  HCT 30.2* 27.8* 30.0*  PLT 154 144* 168    Recent Labs Lab 02/18/13 1519 02/19/13 0530 02/22/13 0928  NA 144 145 144  K 4.4 4.6 4.2  CL 105 109 108  CO2 31 25 24   BUN 35* 35* 23  CREATININE 1.50* 1.45* 1.58*  CALCIUM 9.5 8.9 9.0  GLUCOSE 78 83 105*   TSH 2.778  8/11 UA trace leuks, negative nitrites, WBC 0-2, RBC 0-2, pending culture   Imaging/Diagnostic Tests:  8/11 Portable CXR Question nodular lesion versus overlapping vascular structures of  the superior right hilum. Noncontrast enhanced chest CT advised to  further evaluate this area. Lungs elsewhere clear. Heart is  mildly enlarged but stable.  Advanced osteo necrosis and remodelling in the right shoulder  region. There may be some frank bony destruction in this area. CT  through this area with particular attention to the right glenoid  and may well be advisable with respect to potential underlying  osteomyelitis.  Saralyn Pilar, DO 02/22/2013, 4:18 PM PGY-1,  Family Medicine FPTS Intern pager: [phone], text pages welcome

## 2013-02-22 NOTE — Clinical Social Work Placement (Signed)
     Clinical Social Work Department CLINICAL SOCIAL WORK PLACEMENT NOTE 02/22/2013  Patient:  Kathleen [name], Kathleen [name]  Account Number:  [id number] Admit date:  02/18/2013  Clinical Social Worker:  ,  Date/time:  02/20/2013 03:43 PM  Clinical Social Work is seeking post-discharge placement for this patient at the following level of care:   SKILLED NURSING   (*CSW will update this form in Epic as items are completed)   02/20/2013  Patient/family provided with [facility] Department of Clinical Social Works list of facilities offering this level of care within the geographic area requested by the patient (or if unable, by the patients family).  02/20/2013  Patient/family informed of their freedom to choose among providers that offer the needed level of care, that participate in Medicare, Medicaid or managed care program needed by the patient, have an available bed and are willing to accept the patient.  02/20/2013  Patient/family informed of [facility] ownership interest in [facility], as well as of the fact that they are under no obligation to receive care at this facility.  PASARR submitted to EDS on 02/20/2013 PASARR number received from EDS on 02/20/2013  FL2 transmitted to all facilities in geographic area requested by pt/family on  02/20/2013 FL2 transmitted to all facilities within larger geographic area on   Patient informed that his/her managed care company has contracts with or will negotiate with  certain facilities, including the following:     Patient/family informed of bed offers received:   Patient chooses bed at  Physician recommends and patient chooses bed at    Patient to be transferred to  on   Patient to be transferred to facility by   The following physician request were entered in Epic:   Additional Comments: Daughter prefers [facility].  02/22/13  Bed offers in place for [facility]. CSW spoke with daughter who has elected now to take pt home  with Home Health. She is aware of current bed offers but wants to tour later and make final arrangements for probable SNF placement in the near future.  CSW notified RNCM- Ivonne Andrew who will arrange for HH/DME. DC 02/22/13 to home. No further CSW needs; CSW signing off .  Kathleen [name]. Kathleen [name], LCSWA [phone].

## 2013-02-23 ENCOUNTER — Telehealth: Payer: Self-pay | Admitting: Family Medicine

## 2013-02-23 NOTE — Telephone Encounter (Signed)
Pt's daughter Kathleen [name] called reporting that since pt came home from the hospital yesterday, she has been "sleeping a lot, especially during the day." Pt is rousable and had some confusion when first arriving home last evening, but this morning was more oriented and knew people's names/faces better, etc. Generally appears relatively well, otherwise; temp was 97.7 (admission previously had been for hypothermia). Pt is currently taking amlodipine and Keflex for UTI. Pt received 0.5 mg of Haldol around 0530 on 8/14 and another 0.5 mg on 8/15 around 0020. Kathleen [name] questions whether Haldol could be taking a while to work out of her system. Otherwise has no difficulty breathing, has been eating and interacting okay, but Kathleen [name] states that within a few minutes of stopping talking to her mother, pt will nod back off.  Advised close monitoring over the weekend, as pt otherwise appears well. Possible that Haldol is indeed taking a while to be metabolized, though doses were relatively small and the last one was ~40 hours ago. Kathleen [name] to schedule an appointment for close follow-up (pt currently does not have an appointment until week after next with PCP Dr. Berline Chough). Otherwise advised Kathleen [name] to bring her mother in to the ED if she declines again or if she develops new/worse symptoms such as fever or low body temp again, worse confusion/agitation, etc.   Kathleen Coup Leigh Kaeding, MD 02/23/2013, 5:48 PM

## 2013-02-24 LAB — CULTURE, BLOOD (ROUTINE X 2): Culture: NO GROWTH

## 2013-02-24 NOTE — Discharge Summary (Signed)
I discussed with  Dr Althea Charon.  I agree with their plans documented in their discharge note for today.

## 2013-02-25 ENCOUNTER — Emergency Department (HOSPITAL_COMMUNITY): Payer: Medicare Other

## 2013-02-25 ENCOUNTER — Emergency Department (HOSPITAL_COMMUNITY)
Admission: EM | Admit: 2013-02-25 | Discharge: 2013-02-26 | Disposition: A | Discharge status: Home / Self Care | Payer: Medicare Other | Attending: Emergency Medicine | Admitting: Emergency Medicine

## 2013-02-25 ENCOUNTER — Encounter (HOSPITAL_COMMUNITY): Payer: Self-pay | Admitting: Emergency Medicine

## 2013-02-25 DIAGNOSIS — R4182 Altered mental status, unspecified: Secondary | ICD-10-CM | POA: Insufficient documentation

## 2013-02-25 DIAGNOSIS — D649 Anemia, unspecified: Secondary | ICD-10-CM | POA: Insufficient documentation

## 2013-02-25 DIAGNOSIS — R059 Cough, unspecified: Secondary | ICD-10-CM | POA: Insufficient documentation

## 2013-02-25 DIAGNOSIS — N189 Chronic kidney disease, unspecified: Secondary | ICD-10-CM | POA: Insufficient documentation

## 2013-02-25 DIAGNOSIS — Z8739 Personal history of other diseases of the musculoskeletal system and connective tissue: Secondary | ICD-10-CM | POA: Insufficient documentation

## 2013-02-25 DIAGNOSIS — I509 Heart failure, unspecified: Secondary | ICD-10-CM | POA: Insufficient documentation

## 2013-02-25 DIAGNOSIS — I129 Hypertensive chronic kidney disease with stage 1 through stage 4 chronic kidney disease, or unspecified chronic kidney disease: Secondary | ICD-10-CM | POA: Insufficient documentation

## 2013-02-25 DIAGNOSIS — R5381 Other malaise: Secondary | ICD-10-CM | POA: Insufficient documentation

## 2013-02-25 DIAGNOSIS — R05 Cough: Secondary | ICD-10-CM | POA: Insufficient documentation

## 2013-02-25 DIAGNOSIS — R531 Weakness: Secondary | ICD-10-CM

## 2013-02-25 DIAGNOSIS — R011 Cardiac murmur, unspecified: Secondary | ICD-10-CM | POA: Insufficient documentation

## 2013-02-25 DIAGNOSIS — M1990 Unspecified osteoarthritis, unspecified site: Secondary | ICD-10-CM | POA: Insufficient documentation

## 2013-02-25 DIAGNOSIS — E860 Dehydration: Secondary | ICD-10-CM | POA: Insufficient documentation

## 2013-02-25 DIAGNOSIS — Z87891 Personal history of nicotine dependence: Secondary | ICD-10-CM | POA: Insufficient documentation

## 2013-02-25 DIAGNOSIS — Z7982 Long term (current) use of aspirin: Secondary | ICD-10-CM | POA: Insufficient documentation

## 2013-02-25 DIAGNOSIS — Z79899 Other long term (current) drug therapy: Secondary | ICD-10-CM | POA: Insufficient documentation

## 2013-02-25 LAB — CBC WITH DIFFERENTIAL/PLATELET
Basophils Absolute: 0 10*3/uL (ref 0.0–0.1)
Eosinophils Relative: 1 % (ref 0–5)
Lymphocytes Relative: 29 % (ref 12–46)
Neutro Abs: 3.7 10*3/uL (ref 1.7–7.7)
Platelets: 215 10*3/uL (ref 150–400)
RDW: 14 % (ref 11.5–15.5)
WBC: 6.5 10*3/uL (ref 4.0–10.5)

## 2013-02-25 LAB — COMPREHENSIVE METABOLIC PANEL
ALT: 25 U/L (ref 0–35)
AST: 27 U/L (ref 0–37)
CO2: 24 mEq/L (ref 19–32)
Calcium: 9.2 mg/dL (ref 8.4–10.5)
GFR calc non Af Amer: 20 mL/min — ABNORMAL LOW (ref >90)
Sodium: 137 mEq/L (ref 135–145)

## 2013-02-25 MED ORDER — SODIUM CHLORIDE 0.9 % IV BOLUS (SEPSIS)
500.0000 mL | Freq: Once | INTRAVENOUS | Status: AC
  Administered 2013-02-26: 500 mL via INTRAVENOUS

## 2013-02-25 NOTE — ED Notes (Signed)
FAMILY REPORTED THAT PT. IS LETHARGIC , POOR FLUID INTAKE WITH CHEST CONGESTION AND HYPOTHERMIC THIS EVENING AT HOME .

## 2013-02-25 NOTE — ED Provider Notes (Signed)
CSN: [id number]     Arrival date & time 02/25/13  2148 History     First MD Initiated Contact with Patient 02/25/13 2327     Chief Complaint  Patient presents with  . Altered Mental Status  . Weakness   (Consider location/radiation/quality/duration/timing/severity/associated sxs/prior Treatment) Patient is a 79 y.o. female presenting with altered mental status and weakness.  Altered Mental Status Associated symptoms: weakness   Weakness   Pt recently admitted for AMS and hypothermia has been at home for the last several day. Daughter reports the patient has not been very active since arriving home, sleeps for long periods during the day, sometimes hard to arouse or falls asleep during conversations and then is up late at night confused and talking when no one is there. She has had some increasing cough and ?SOB at home over the last 2 days and daughter reports axillary temp this evening was 19F. She was advised by home health nurse to come to the ED for evaluation. Pt denies any specific complaints. Daughter reports she has not had much PO intake since coming home, they have had a difficult time getting her to drink fluids.   Past Medical History  Diagnosis Date  . Hypertension   . Arthritis   . Spinal stenosis   . Osteoarthritis   . Anemia   . CHF (congestive heart failure)   . Heart murmur     aortic regurg  . Osteoporosis   . Chronic kidney disease   . Generalized OA 12/16/2011    Significant. Seems to have encompass all joints. Patient is complaining of significant pain but dementia allows patient to be happy.   Past Surgical History  Procedure Laterality Date  . Nephrectomy Right   . Tonsillectomy     Family History  Problem Relation Age of Onset  . Hypertension Daughter    History  Substance Use Topics  . Smoking status: Former Games developer  . Smokeless tobacco: Never Used     Comment: quit smoking in the 70's  . Alcohol Use: No   OB History   Grav Para Term Preterm  Abortions TAB SAB Ect Mult Living                 Review of Systems  Neurological: Positive for weakness.   All other systems reviewed and are negative except as noted in HPI.   Allergies  Review of patient's allergies indicates no known allergies.  Home Medications   Current Outpatient Rx  Name  Route  Sig  Dispense  Refill  . amLODipine (NORVASC) 5 MG tablet   Oral   Take 1 tablet (5 mg total) by mouth daily.   30 tablet   3   . aspirin 81 MG tablet   Oral   Take 81 mg by mouth daily.           . calcium-vitamin D (OSCAL WITH D) 500-200 MG-UNIT per tablet   Oral   Take 1 tablet by mouth 2 (two) times daily.         . cephALEXin (KEFLEX) 500 MG capsule   Oral   Take 500 mg by mouth 2 (two) times daily. Start date and duration unknown         . docusate sodium (COLACE) 100 MG capsule   Oral   Take 100 mg by mouth daily.         . ferrous sulfate (FERROUSUL) 325 (65 FE) MG tablet   Oral   Take 325  mg by mouth 2 (two) times daily.          Marland Kitchen lisinopril (PRINIVIL,ZESTRIL) 5 MG tablet   Oral   Take 5 mg by mouth daily.         . Multiple Vitamins-Minerals (SENIOR MULTIVITAMIN PLUS) TABS   Oral   Take 1 tablet by mouth 1 dose over 24 hours.   100 tablet   12   . Nutritional Supplement LIQD   Oral   Take 60 mLs by mouth daily. MED PASS         . psyllium (METAMUCIL SMOOTH TEXTURE) 28 % packet   Oral   Take 1 packet by mouth daily with breakfast.         . traMADol (ULTRAM) 50 MG tablet   Oral   Take 50 mg by mouth every 12 (twelve) hours as needed for pain.          BP 135/80  Pulse 73  Temp(Src) 97.5 F (36.4 C) (Oral)  Resp 17  SpO2 98% Physical Exam  Nursing note and vitals reviewed. Constitutional: She is oriented to person, place, and time. She appears well-developed and well-nourished.  HENT:  Head: Normocephalic and atraumatic.  Eyes: EOM are normal. Pupils are equal, round, and reactive to light.  Neck: Normal range of  motion. Neck supple.  Cardiovascular: Normal rate, normal heart sounds and intact distal pulses.   Pulmonary/Chest: Effort normal and breath sounds normal.  Abdominal: Bowel sounds are normal. She exhibits no distension. There is no tenderness.  Musculoskeletal: Normal range of motion. She exhibits no edema and no tenderness.  Neurological: She is alert and oriented to person, place, and time. She has normal strength. No cranial nerve deficit or sensory deficit.  Skin: Skin is warm and dry. No rash noted.  Psychiatric: She has a normal mood and affect.    ED Course   Procedures (including critical care time)  Labs Reviewed  CBC WITH DIFFERENTIAL - Abnormal; Notable for the following:    RBC 3.23 (*)    Hemoglobin 10.1 (*)    HCT 27.8 (*)    MCHC 36.3 (*)    All other components within normal limits  COMPREHENSIVE METABOLIC PANEL - Abnormal; Notable for the following:    Glucose, Bld 112 (*)    BUN 53 (*)    Creatinine, Ser 2.09 (*)    Albumin 3.1 (*)    Total Bilirubin 0.2 (*)    GFR calc non Af Amer 20 (*)    GFR calc Af Amer 23 (*)    All other components within normal limits  URINALYSIS, ROUTINE W REFLEX MICROSCOPIC - Abnormal; Notable for the following:    APPearance CLOUDY (*)    Protein, ur 30 (*)    Leukocytes, UA TRACE (*)    All other components within normal limits  URINE MICROSCOPIC-ADD ON - Abnormal; Notable for the following:    Squamous Epithelial / LPF MANY (*)    Bacteria, UA FEW (*)    All other components within normal limits  URINE CULTURE   Dg Chest 2 View  02/25/2013   *RADIOLOGY REPORT*  Clinical Data: Altered mental status, weakness, CHF, low blood pressure.  CHEST - 2 VIEW  Comparison: 02/18/2013  Findings: The marked cardiac enlargement.  No pulmonary vascular congestion.  Tortuous aorta.  12 mm nodule again demonstrated in the right upper lung which could represent vascular structures or nodular opacity.  No focal airspace consolidation in the  lungs.  No  blunting of costophrenic angles.  No pneumothorax.  Diffuse bone demineralization.  Thoracic scoliosis convex towards the right. Degenerative changes in both shoulders.  Significant bone resorption in the right glenohumeral joint, stable since previous study.  Also some acromial space on the left suggest chronic rotator cuff arthropathy.  IMPRESSION: Cardiac enlargement without vascular congestion.  No evidence of active pulmonary disease.  Indeterminate nodule in the right upper lung.  Stable appearance since previous study.   Original Report Authenticated By: Burman Nieves, M.D.   1. Dehydration   2. General weakness     MDM  Pt with borderline increase in creatinine above baseline of 1.5 likely from poor PO intake at home. Labs otherwise unremarkable including UTI. Pt with no hypothermia in the ED, advised family to avoid axillary measurements if possible due to inaccuracy in that location. CXR neg for intiltrate. Pt has appointment with PCP later this morning in approx 8hrs. I do not feel the patient would benefit from admission at this time. Family advised to encourage patient to drink adequate fluids but cautioned to avoid excess fluids or salt intake due to reported history of CHF. This can be further discussed with PCP later today.   Harwood Nall B. Bernette Mayers, MD 02/26/13 [phone]

## 2013-02-26 ENCOUNTER — Encounter: Payer: Self-pay | Admitting: Family Medicine

## 2013-02-26 ENCOUNTER — Ambulatory Visit (INDEPENDENT_AMBULATORY_CARE_PROVIDER_SITE_OTHER): Payer: Medicare Other | Admitting: Family Medicine

## 2013-02-26 VITALS — BP 114/48 | HR 56 | Temp 94.1°F | Wt 118.0 lb

## 2013-02-26 DIAGNOSIS — R6889 Other general symptoms and signs: Secondary | ICD-10-CM

## 2013-02-26 DIAGNOSIS — R413 Other amnesia: Secondary | ICD-10-CM

## 2013-02-26 DIAGNOSIS — F4489 Other dissociative and conversion disorders: Secondary | ICD-10-CM

## 2013-02-26 DIAGNOSIS — F05 Delirium due to known physiological condition: Secondary | ICD-10-CM | POA: Insufficient documentation

## 2013-02-26 LAB — URINALYSIS, ROUTINE W REFLEX MICROSCOPIC
Bilirubin Urine: NEGATIVE
Glucose, UA: NEGATIVE mg/dL
Hgb urine dipstick: NEGATIVE
Specific Gravity, Urine: 1.014 (ref 1.005–1.030)
Urobilinogen, UA: 0.2 mg/dL (ref 0.0–1.0)
pH: 5 (ref 5.0–8.0)

## 2013-02-26 LAB — URINE MICROSCOPIC-ADD ON

## 2013-02-26 MED ORDER — HALOPERIDOL 1 MG PO TABS: 1.0000 mg | ORAL_TABLET | Freq: Every evening | ORAL | Status: DC | PRN

## 2013-02-26 MED ORDER — HALOPERIDOL 0.5 MG PO TABS
0.5000 mg | ORAL_TABLET | Freq: Three times a day (TID) | ORAL | Status: DC | PRN
Start: 2013-02-26 — End: 2013-03-06

## 2013-02-26 NOTE — Assessment & Plan Note (Signed)
Acute on Chronic renal failure. Suspect prerenal given BUN/Cr ratio. Normal K+ and bicarb.  Encouraged family to continue plan to hydrate with low sugar juices (patient does not drink much water) and ensure.

## 2013-02-26 NOTE — Assessment & Plan Note (Signed)
Recurrent. No s/s of occult infection  Reassurance Warming at home. Complete keflex prescribed for recent UTI.
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
## 2013-02-26 NOTE — Assessment & Plan Note (Signed)
Started haldol, low dose. Avoid seroquel given AV block.

## 2013-02-26 NOTE — Patient Instructions (Addendum)
Thank you for bringing [name] in today. Her CXR did not show pneumonia or evidence of infection. Her urine obtained in the ED was not a clean catch, but I doubt that she has another UTI. Please finish keflex.  Please start haldol 1 mg nightly a needed for agitation/sundowning. This can also be taken during the day. A neurology referral is not indicated at this time.  Please f/u with Dr. Jennette Kettle (change PCP back to Dr. Jennette Kettle)  Dr. Armen Pickup

## 2013-02-27 ENCOUNTER — Telehealth: Payer: Self-pay | Admitting: *Deleted

## 2013-02-27 ENCOUNTER — Other Ambulatory Visit: Payer: Self-pay | Admitting: Family Medicine

## 2013-02-27 LAB — URINE CULTURE
Colony Count: NO GROWTH
Culture: NO GROWTH

## 2013-02-27 NOTE — Progress Notes (Signed)
Dear Cliffton Asters Team Please call [facility] and order these things for her: (Babs' Mom) They can send me paperwork to fillout if they want or if they need faxed rx I can do that 1)shower chair 2) standard high back wheel chair with pressure cusion 3) bedside commode THANKS! Denny Levy

## 2013-02-27 NOTE — Telephone Encounter (Signed)
[facility] called to [facility] order - please fax hand written script with orders, demographics and last h&P - unable to call in . Thanks! Wyatt Haste, RN-BSN

## 2013-02-28 ENCOUNTER — Telehealth: Payer: Self-pay | Admitting: Family Medicine

## 2013-02-28 NOTE — Telephone Encounter (Signed)
Please advise/clarify order - I will be happy to call in. Wyatt Haste, RN-BSN

## 2013-02-28 NOTE — Telephone Encounter (Signed)
Occupation Therapy 1wk1 2wk2 1wk3 for transfers, adl's, adaptive equiptment, home exercise program, discharge if goals not met, verbal order is ok.

## 2013-02-28 NOTE — Telephone Encounter (Signed)
Kathleen [name] I am placing on your desk [facility]! Denny Levy

## 2013-03-01 NOTE — Telephone Encounter (Signed)
Faxed all the requested info. Wyatt Haste, RN-BSN

## 2013-03-01 NOTE — Telephone Encounter (Signed)
Elizabeth Sounds good to me--Ok to [employer]! Denny Levy

## 2013-03-03 NOTE — Progress Notes (Addendum)
Bed offer from [facility] in place;  CSW met with patient's daughter who has elected to take patient home with her at this time rather than seek SNF placement. She plans to follow up with facility in the future if needed but wants to try to care for her mother at home for now. RNCM is aware and will arrange Home health. CSW notified [facility] Admissions of above. No further CSW needs identified. CSW signing off. Lorri Frederick. [place] Pugh  [phone]

## 2013-03-04 NOTE — Telephone Encounter (Signed)
Returned calls and orders given verbally for OT. Wyatt Haste, RN-BSN

## 2013-03-06 ENCOUNTER — Ambulatory Visit (INDEPENDENT_AMBULATORY_CARE_PROVIDER_SITE_OTHER): Payer: Medicare Other | Admitting: Sports Medicine

## 2013-03-06 VITALS — BP 143/48 | HR 75 | Temp 97.9°F | Wt 115.0 lb

## 2013-03-06 DIAGNOSIS — F05 Delirium due to known physiological condition: Secondary | ICD-10-CM

## 2013-03-06 DIAGNOSIS — R262 Difficulty in walking, not elsewhere classified: Secondary | ICD-10-CM | POA: Insufficient documentation

## 2013-03-06 DIAGNOSIS — Z9181 History of falling: Secondary | ICD-10-CM

## 2013-03-06 DIAGNOSIS — L8990 Pressure ulcer of unspecified site, unspecified stage: Secondary | ICD-10-CM

## 2013-03-06 DIAGNOSIS — D649 Anemia, unspecified: Secondary | ICD-10-CM

## 2013-03-06 DIAGNOSIS — L8992 Pressure ulcer of unspecified site, stage 2: Secondary | ICD-10-CM | POA: Insufficient documentation

## 2013-03-06 DIAGNOSIS — F4489 Other dissociative and conversion disorders: Secondary | ICD-10-CM

## 2013-03-06 DIAGNOSIS — I10 Essential (primary) hypertension: Secondary | ICD-10-CM

## 2013-03-06 MED ORDER — DOCUSATE SODIUM 100 MG PO CAPS: 100.0000 mg | ORAL_CAPSULE | Freq: Every day | ORAL | Status: DC | PRN

## 2013-03-06 NOTE — Assessment & Plan Note (Signed)
Patient does need a high back wheelchair but in order has been placed. Occupational therapy and physical therapy orders have been placed and are coming out to her house.

## 2013-03-06 NOTE — Assessment & Plan Note (Signed)
Continue by mouth iron

## 2013-03-06 NOTE — Assessment & Plan Note (Signed)
Safety is obviously concerned and she is to continue working with physical therapy and occupational therapy with 24-hour supervision.  Due to the family is doing an excellent job providing care at this time and I am hopeful that she will do well at home

## 2013-03-06 NOTE — Assessment & Plan Note (Addendum)
Patient is on amlodipine and this is likely what is causing her lower extremity edema as she is not previously had this.  She is not taking the ACE inhibitor that was previously started while in the nursing home. This needs to be readdressed and likely needs to have her amlodipine discontinued and ACE inhibitor restarted for renal protection given her CKD. stage III.  Family is hesitant because amlodipine seems to be controlling her pressure better.

## 2013-03-06 NOTE — Progress Notes (Signed)
  [facility]  Patient name: Kathleen [name] MRN [id number]  Date of birth: [birth year]  CC & HPI:  [name] is a 79 y.o. female presenting to clinic.  Chief Complaint  Patient presents with  . Hospitalization Follow-up    doing better, ambulating on own some, no urinary complaints No specific concerns today.   Pt denies chest pain, dyspnea at rest or exertion, PND, lower extremity edema. Patient denies any facial asymmetry, unilateral weakness, or dysarthria. Family reports that she seems to be doing significantly better.  She is ambulating independently with her walker.  She had 1 fall while in the bathroom but other than that does seem to be doing well.   Her granddaughter does report a small pressure ulcer on her sacrum.  There has been a prescription sent in by Doctor Jennette Kettle for a high back wheelchair and pressure reduction cushion as well as a 3 and 1 cup.  These orders are still pending.    . Fall    10 days ago, struck face on tub Denies any headaches, nausea, vomiting, blurred vision, difficulty concentrating.       ROS:  PER HPI  Pertinent History Reviewed:  Medical & Surgical Hx:  Reviewed: Significant for history of when he box second degree AV block, paroxysmal atrial fibrillation, on aspirin therapy.  CKD. stage III. Medications: Reviewed & Updated - see associated section Social History: Reviewed -  reports that she has quit smoking. She has never used smokeless tobacco.  Objective Findings:  Vitals: BP 143/48  Pulse 75  Temp(Src) 97.9 F (36.6 C) (Oral)  Wt 115 lb (52.164 kg)  BMI 26.73 kg/m2 PE: GENERAL:  elderly African American female. In no discomfort; no respiratory distress  PSYCH:  alert and appropriate, good insight   HNEENT:   significant left eye ecchymosis with small frontal hematoma.  There is no disconjugate gaze, extraocular muscles are intact.  There is no scleral hemorrhage .    CARDIO:   heart rate is irregularly  irregular, she has a 2+ out of 6 systolic murmur.    LUNGS:  CTA B, no wheezes, no crackles  ABDOMEN:    EXTREM:  moves all 4 extremities spontaneously.  There is no lateralization.  Get up and go tests is greater than 7 seconds.  She does have difficulty sitting back down and has poor safety awareness.  Lower extremity edemas 2+/4 and bilateral ankles does not go beyond the knees.  ,  No sacral edema noted   GU:   SKIN:  she has a stage II pressure ulcer on her sacrum within the right gluteal fold that is 10 mm x 14 mm.  No other ulcers is appreciated   NEUROMSK:     Assessment & Plan:   1. Difficulty walking   2. Pressure ulcer stage II    See problem associated charting

## 2013-03-06 NOTE — Assessment & Plan Note (Signed)
Seems to be doing better.  Family has stopped using the Haldol and she has been doing well.

## 2013-03-06 NOTE — Addendum Note (Signed)
Addended by: Gaspar Bidding D on: 03/06/2013 12:48 PM   Modules accepted: Orders, Medications

## 2013-03-08 ENCOUNTER — Other Ambulatory Visit: Payer: Self-pay | Admitting: Family Medicine

## 2013-03-08 ENCOUNTER — Encounter: Payer: Self-pay | Admitting: Family Medicine

## 2013-03-08 ENCOUNTER — Telehealth: Payer: Self-pay | Admitting: *Deleted

## 2013-03-08 DIAGNOSIS — M159 Polyosteoarthritis, unspecified: Secondary | ICD-10-CM

## 2013-03-08 DIAGNOSIS — R198 Other specified symptoms and signs involving the digestive system and abdomen: Secondary | ICD-10-CM

## 2013-03-08 DIAGNOSIS — L8992 Pressure ulcer of unspecified site, stage 2: Secondary | ICD-10-CM

## 2013-03-08 DIAGNOSIS — R262 Difficulty in walking, not elsewhere classified: Secondary | ICD-10-CM

## 2013-03-08 NOTE — Progress Notes (Signed)
I saw and examined Kathleen [name] at her office visit with Dr Berline Chough on 03/06/2013 here in the [facility]. She has continued worsening fraility, wekaness. Now also has a stage 2-3 pressure ulcer onher sacral area. Gait abnrormality and difficulty arising from chair. Please see his note for additional details. I agree with his assessment and plan and appreciate him seeing my continuity patient.

## 2013-03-08 NOTE — Telephone Encounter (Signed)
Patient had home visit yesterday.  Notes improvement with ambulation and ADL's.  Patient had low temperature (94.1) and family covered her with blankets.  Was asymptomatic.  Family would like to have nurse visits increased.  Will route note to Dr. Jennette Kettle.  Senaida Ores, Maryjean Ka, RN

## 2013-03-13 NOTE — Telephone Encounter (Signed)
Dear Cliffton Asters Team Please see if we need a written order ot do this? THANKS! Denny Levy

## 2013-03-15 NOTE — Telephone Encounter (Signed)
LVM for callista to call back to see if I can give verbal orders or if it needs to be written out

## 2013-04-26 ENCOUNTER — Encounter: Payer: Self-pay | Admitting: Family Medicine

## 2013-04-26 ENCOUNTER — Ambulatory Visit (INDEPENDENT_AMBULATORY_CARE_PROVIDER_SITE_OTHER): Payer: Medicare Other | Admitting: Family Medicine

## 2013-04-26 VITALS — BP 148/64 | Ht 61.0 in | Wt 115.0 lb

## 2013-04-26 DIAGNOSIS — R05 Cough: Secondary | ICD-10-CM

## 2013-04-26 DIAGNOSIS — J209 Acute bronchitis, unspecified: Secondary | ICD-10-CM

## 2013-04-26 NOTE — Progress Notes (Signed)
  Subjective:    Patient ID: Kathleen [name], female    DOB: 09/03/1922, 79 y.o.   MRN: [id number]  HPI Patient for complaint of cough, last 2 weeks. No specific fevers. No sweats. I had started her on some Tessalon Perles which did not seem to help. She is having mildly productive cough and sputum  Review of Systems . No hemoptysis. No weight loss.     Objective:   Physical Exam Vital signs are reviewed general: Well-developed female no acute distress LUNGS: Faint extra wheeze right base otherwise clear to auscultation. No accessory muscle use. Cardiovascular regular rate and rhythm       Assessment & Plan:  #1. Bronchitis. We'll treat with Z-Pak, 3 days of steroids at 20 mg daily and Tylenol No. 3 for cough. Followup if not improving.

## 2013-05-08 ENCOUNTER — Non-Acute Institutional Stay (INDEPENDENT_AMBULATORY_CARE_PROVIDER_SITE_OTHER): Payer: Medicare Other | Admitting: Family Medicine

## 2013-05-08 DIAGNOSIS — Y9210 Unspecified residential institution as the place of occurrence of the external cause: Secondary | ICD-10-CM

## 2013-05-08 DIAGNOSIS — Z593 Problems related to living in residential institution: Secondary | ICD-10-CM

## 2013-05-08 NOTE — Progress Notes (Signed)
Patient ID: Kathleen [name], female   DOB: 1923-05-25, 79 y.o.   MRN: [id number] Opened note to place NH discharge date

## 2013-05-09 ENCOUNTER — Telehealth: Payer: Self-pay | Admitting: Family Medicine

## 2013-05-09 NOTE — Telephone Encounter (Signed)
Message copied by Dessa Phi on Thu May 09, 2013 12:50 PM ------      Message from: Marena Chancy E      Created: Wed May 08, 2013  4:25 PM      Regarding: RE: note from you 02/18/2013. wrong chart.       I wrote a note in Ms. Ferguson's chart about Ms. Childers and wrote an addendum at the top of it stating that I had entered it in error at the time. I couldn't figure out how to delete the note when I realized I had made the mistake. How can I delete it?       Thanks for the heads up             ----- Message -----         From: Lora Paula, MD         Sent: 05/08/2013   2:16 PM           To: Lora Paula, MD, Lonia Skinner, MD      Subject: note from you 02/18/2013. wrong chart.                    Steph,            It looks like you wrote a nursing home  note for Ms. Emelda Fear in Mrs. Wessells chart on 02/18/13.             Please go into the note via patient station to remove it. You can write that the documentation was placed in wrong chart.             Khan Chura        ------

## 2013-05-30 ENCOUNTER — Encounter (HOSPITAL_COMMUNITY): Payer: Self-pay | Admitting: Emergency Medicine

## 2013-05-30 ENCOUNTER — Emergency Department (HOSPITAL_COMMUNITY): Payer: Medicare Other

## 2013-05-30 ENCOUNTER — Observation Stay (HOSPITAL_COMMUNITY)
Admission: EM | Admit: 2013-05-30 | Discharge: 2013-06-01 | Disposition: A | Discharge status: Home / Self Care | Payer: Medicare Other | Attending: Family Medicine | Admitting: Family Medicine

## 2013-05-30 DIAGNOSIS — R5381 Other malaise: Principal | ICD-10-CM | POA: Insufficient documentation

## 2013-05-30 DIAGNOSIS — M4802 Spinal stenosis, cervical region: Secondary | ICD-10-CM | POA: Insufficient documentation

## 2013-05-30 DIAGNOSIS — R011 Cardiac murmur, unspecified: Secondary | ICD-10-CM | POA: Insufficient documentation

## 2013-05-30 DIAGNOSIS — I480 Paroxysmal atrial fibrillation: Secondary | ICD-10-CM

## 2013-05-30 DIAGNOSIS — I129 Hypertensive chronic kidney disease with stage 1 through stage 4 chronic kidney disease, or unspecified chronic kidney disease: Secondary | ICD-10-CM | POA: Insufficient documentation

## 2013-05-30 DIAGNOSIS — I509 Heart failure, unspecified: Secondary | ICD-10-CM | POA: Insufficient documentation

## 2013-05-30 DIAGNOSIS — E875 Hyperkalemia: Secondary | ICD-10-CM

## 2013-05-30 DIAGNOSIS — F0390 Unspecified dementia without behavioral disturbance: Secondary | ICD-10-CM | POA: Insufficient documentation

## 2013-05-30 DIAGNOSIS — I441 Atrioventricular block, second degree: Secondary | ICD-10-CM

## 2013-05-30 DIAGNOSIS — I4891 Unspecified atrial fibrillation: Secondary | ICD-10-CM | POA: Insufficient documentation

## 2013-05-30 DIAGNOSIS — M810 Age-related osteoporosis without current pathological fracture: Secondary | ICD-10-CM | POA: Insufficient documentation

## 2013-05-30 DIAGNOSIS — I498 Other specified cardiac arrhythmias: Secondary | ICD-10-CM | POA: Insufficient documentation

## 2013-05-30 DIAGNOSIS — N183 Chronic kidney disease, stage 3 unspecified: Secondary | ICD-10-CM | POA: Insufficient documentation

## 2013-05-30 DIAGNOSIS — D649 Anemia, unspecified: Secondary | ICD-10-CM | POA: Insufficient documentation

## 2013-05-30 LAB — CBC WITH DIFFERENTIAL/PLATELET
Basophils Absolute: 0 10*3/uL (ref 0.0–0.1)
Eosinophils Absolute: 0 10*3/uL (ref 0.0–0.7)
Eosinophils Relative: 1 % (ref 0–5)
HCT: 31 % — ABNORMAL LOW (ref 36.0–46.0)
Lymphocytes Relative: 35 % (ref 12–46)
MCH: 31.1 pg (ref 26.0–34.0)
MCHC: 36.8 g/dL — ABNORMAL HIGH (ref 30.0–36.0)
MCV: 84.5 fL (ref 78.0–100.0)
Monocytes Absolute: 0.2 10*3/uL (ref 0.1–1.0)
RDW: 13.9 % (ref 11.5–15.5)
WBC: 4.4 10*3/uL (ref 4.0–10.5)

## 2013-05-30 LAB — POTASSIUM: Potassium: 5.9 mEq/L — ABNORMAL HIGH (ref 3.5–5.1)

## 2013-05-30 LAB — COMPREHENSIVE METABOLIC PANEL
AST: 35 U/L (ref 0–37)
CO2: 29 mEq/L (ref 19–32)
Calcium: 9.7 mg/dL (ref 8.4–10.5)
Creatinine, Ser: 1.66 mg/dL — ABNORMAL HIGH (ref 0.50–1.10)
GFR calc Af Amer: 31 mL/min — ABNORMAL LOW (ref >90)
GFR calc non Af Amer: 27 mL/min — ABNORMAL LOW (ref >90)
Glucose, Bld: 84 mg/dL (ref 70–99)

## 2013-05-30 LAB — URINALYSIS, ROUTINE W REFLEX MICROSCOPIC
Bilirubin Urine: NEGATIVE
Glucose, UA: NEGATIVE mg/dL
Hgb urine dipstick: NEGATIVE
Ketones, ur: NEGATIVE mg/dL
Protein, ur: NEGATIVE mg/dL

## 2013-05-30 MED ORDER — SODIUM POLYSTYRENE SULFONATE 15 GM/60ML PO SUSP
15.0000 g | Freq: Once | ORAL | Status: AC
  Administered 2013-05-31: 15 g via ORAL
  Filled 2013-05-30: qty 60

## 2013-05-30 NOTE — ED Notes (Signed)
Family concerned about pt having a UTI. Sts pt has had periods of confusion at home and has fallen.  When asked if pt was having any pain, pt pointed to hips bilaterally.  Pt oriented to person and place.  Pt very shaky during assessment. Family sts pt has progressively gotten worse when it comes to the shaking.

## 2013-05-30 NOTE — ED Provider Notes (Signed)
CSN: [id number]     Arrival date & time 05/30/13  1857 History   First MD Initiated Contact with Patient 05/30/13 2054     Chief Complaint  Patient presents with  . Weakness   (Consider location/radiation/quality/duration/timing/severity/associated sxs/prior Treatment) HPI Comments: 79 year old female complaining of several days worth of weakness. Per family patient has had generalized weakness and fatigue for several days. Also states she has been slightly more confused than usual. Family is concerned development of possible UTI. Had previous symptoms with the onset of UTI. Denies any dysuria fevers. No focal weakness, just generalized fatigue   Patient is a 79 y.o. female presenting with weakness.  Weakness This is a recurrent problem. The current episode started in the past 7 days. The problem occurs constantly. The problem has been gradually worsening. Associated symptoms include fatigue and weakness. Pertinent negatives include no chest pain, coughing, fever, rash or vomiting. Nothing aggravates the symptoms. She has tried nothing for the symptoms.    Past Medical History  Diagnosis Date  . Hypertension   . Arthritis   . Spinal stenosis   . Osteoarthritis   . Anemia   . CHF (congestive heart failure)   . Heart murmur     aortic regurg  . Osteoporosis   . Chronic kidney disease   . Generalized OA 12/16/2011    Significant. Seems to have encompass all joints. Patient is complaining of significant pain but dementia allows patient to be happy.   Past Surgical History  Procedure Laterality Date  . Nephrectomy Right   . Tonsillectomy     Family History  Problem Relation Age of Onset  . Hypertension Daughter    History  Substance Use Topics  . Smoking status: Former Games developer  . Smokeless tobacco: Never Used     Comment: quit smoking in the 70's  . Alcohol Use: No   OB History   Grav Para Term Preterm Abortions TAB SAB Ect Mult Living                 Review of Systems   Constitutional: Positive for fatigue. Negative for fever.  Respiratory: Negative for cough.   Cardiovascular: Negative for chest pain.  Gastrointestinal: Negative for vomiting.  Genitourinary: Negative for decreased urine volume.  Musculoskeletal: Positive for gait problem.  Skin: Negative for rash.  Neurological: Positive for weakness.  Psychiatric/Behavioral: Positive for confusion.  All other systems reviewed and are negative.    Allergies  Review of patient's allergies indicates no known allergies.  Home Medications   Current Outpatient Rx  Name  Route  Sig  Dispense  Refill  . amLODipine (NORVASC) 5 MG tablet   Oral   Take 1 tablet (5 mg total) by mouth daily.   30 tablet   3   . calcium-vitamin D (OSCAL WITH D) 500-200 MG-UNIT per tablet   Oral   Take 1 tablet by mouth 2 (two) times daily.         Marland Kitchen docusate sodium (COLACE) 100 MG capsule   Oral   Take 1 capsule (100 mg total) by mouth daily as needed for constipation.   10 capsule      . ferrous sulfate (FERROUSUL) 325 (65 FE) MG tablet   Oral   Take 325 mg by mouth 2 (two) times daily.          . Multiple Vitamins-Minerals (SENIOR MULTIVITAMIN PLUS) TABS   Oral   Take 1 tablet by mouth 1 dose over 24 hours.   100  tablet   12    BP 167/85  Pulse 76  Temp(Src) 98.1 F (36.7 C) (Oral)  Resp 20  Ht 4\' 11"  (1.499 m)  Wt 115 lb (52.164 kg)  BMI 23.21 kg/m2  SpO2 98% Physical Exam  Nursing note and vitals reviewed. Constitutional: She appears well-developed and well-nourished.  HENT:  Head: Normocephalic and atraumatic.  Eyes: EOM are normal. Pupils are equal, round, and reactive to light.  Neck: Normal range of motion.  Cardiovascular: Normal rate, regular rhythm and intact distal pulses.   Pulmonary/Chest: Effort normal and breath sounds normal. No respiratory distress.  Abdominal: Soft. She exhibits no distension. There is no tenderness.  Musculoskeletal: Normal range of motion.   Neurological: She is alert. No cranial nerve deficit. She exhibits normal muscle tone. Coordination normal.  Slight confusion but oriented to person and time.  Skin: Skin is warm and dry.  Psychiatric: She has a normal mood and affect. She is slowed.    ED Course  Procedures (including critical care time) Labs Review Labs Reviewed  URINALYSIS, ROUTINE W REFLEX MICROSCOPIC - Abnormal; Notable for the following:    Leukocytes, UA TRACE (*)    All other components within normal limits  CBC WITH DIFFERENTIAL - Abnormal; Notable for the following:    RBC 3.67 (*)    Hemoglobin 11.4 (*)    HCT 31.0 (*)    MCHC 36.8 (*)    All other components within normal limits  COMPREHENSIVE METABOLIC PANEL - Abnormal; Notable for the following:    Potassium 5.9 (*)    BUN 54 (*)    Creatinine, Ser 1.66 (*)    Albumin 3.4 (*)    Total Bilirubin 0.1 (*)    GFR calc non Af Amer 27 (*)    GFR calc Af Amer 31 (*)    All other components within normal limits  POTASSIUM - Abnormal; Notable for the following:    Potassium 5.9 (*)    All other components within normal limits  URINE MICROSCOPIC-ADD ON   Imaging Review Dg Chest 2 View  05/30/2013   CLINICAL DATA:  Shortness of breath. Weakness. Hypertension. Congestive heart failure.  EXAM: CHEST  2 VIEW  COMPARISON:  02/25/2013  FINDINGS: Deformity and erosion at the right glenohumeral joint.  Calcified right suprahilar granuloma. Tortuous thoracic aorta. Mildly enlarged cardiopericardial silhouette.  IMPRESSION: 1. Chronic cardiomegaly. 2. Calcified right upper lobe lesion favoring granuloma or chronic pulmonary hemorrhage,. 3. Severe erosive right glenohumeral arthropathy, similar to prior. 4. Tortuous thoracic aorta.   Electronically Signed   By: Herbie Baltimore M.D.   On: 05/30/2013 22:03    EKG Interpretation    Date/Time:  Thursday May 30 2013 21:19:44 EST Ventricular Rate:  65 PR Interval:  443 QRS Duration: 82 QT Interval:  443 QTC  Calculation: 461 R Axis:   -9 Text Interpretation:  Sinus rhythm Sinus pause Prolonged PR interval Abnormal R-wave progression, early transition Left ventricular hypertrophy Anterior Q waves, possibly due to LVH Confirmed by ALLEN  MD, ANTHONY ([id number]) on 05/30/2013 11:15:56 PM            MDM   1. Hyperkalemia    AFVSS, NAD. Concern for a possible UTI or infectious issue. Patient often develops generalized weakness and slight confusion with previous UTIs. UA showed no signs of infection. CBC no leukocytosis. No CVA tenderness or suprapubic pain and UTI pyelonephritis. Patient clear to auscultation bilaterally. No reports of cough. Chest x-ray within normal limits. Doubt pulmonary pathology.  The skin findings to indicate cellulitis. No infectious source identified.  BMP remarkable for potassium 5.9. Repeat potassium confirmed 5.9. Patient given Kayexalate. EKG showed no peak T waves or other EKG changes consistent with hyperkalemia. Calcium and other interventions not  Acutely needed at this time. CT head was also obtained his patient and family did report some confusion. CT showed no intracranial abnormalities. On reexam patient has no focal deficits has no concerning findings on physical exam. Nontoxic no apparent distress. Given hyperkalemia and generalized weakness patient will be admitted to medicine service for continued management. Family medicine to admit. Remains stable in the emergency department transfer  Patient discussed with attending Dr. Freida Busman.      Bridgett Larsson, MD 05/31/13 [phone]

## 2013-05-30 NOTE — ED Provider Notes (Signed)
I saw and evaluated the patient, reviewed the resident's note and I agree with the findings and plan.  EKG Interpretation    Date/Time:  Thursday May 30 2013 21:19:44 EST Ventricular Rate:  65 PR Interval:  443 QRS Duration: 82 QT Interval:  443 QTC Calculation: 461 R Axis:   -9 Text Interpretation:  Sinus rhythm Sinus pause Prolonged PR interval Abnormal R-wave progression, early transition Left ventricular hypertrophy Anterior Q waves, possibly due to LVH Confirmed by Keali Mccraw  MD, Williemae Muriel ([id number]) on 05/30/2013 11:15:56 PM            Pt seen and examined, hyperkalemia noted and pt given kaexylate and will be admitted to fpc  Toy Baker, MD 05/30/13 2350

## 2013-05-30 NOTE — ED Notes (Addendum)
Pt.'s family reported that pt. has been confused with generalized weakness " shaking" for several day , fell 2 days ago with right leg pain, they are also concerned that pt. has UTI .

## 2013-05-31 ENCOUNTER — Encounter (HOSPITAL_COMMUNITY): Payer: Self-pay | Admitting: *Deleted

## 2013-05-31 DIAGNOSIS — I359 Nonrheumatic aortic valve disorder, unspecified: Secondary | ICD-10-CM

## 2013-05-31 DIAGNOSIS — I4891 Unspecified atrial fibrillation: Secondary | ICD-10-CM

## 2013-05-31 DIAGNOSIS — I441 Atrioventricular block, second degree: Secondary | ICD-10-CM

## 2013-05-31 DIAGNOSIS — E875 Hyperkalemia: Secondary | ICD-10-CM

## 2013-05-31 DIAGNOSIS — I509 Heart failure, unspecified: Secondary | ICD-10-CM

## 2013-05-31 LAB — BASIC METABOLIC PANEL
BUN: 52 mg/dL — ABNORMAL HIGH (ref 6–23)
BUN: 45 mg/dL — ABNORMAL HIGH (ref 6–23)
CO2: 30 mEq/L (ref 19–32)
Calcium: 9.7 mg/dL (ref 8.4–10.5)
Calcium: 9.9 mg/dL (ref 8.4–10.5)
Chloride: 108 mEq/L (ref 96–112)
Creatinine, Ser: 1.42 mg/dL — ABNORMAL HIGH (ref 0.50–1.10)
GFR calc Af Amer: 37 mL/min — ABNORMAL LOW (ref >90)
GFR calc non Af Amer: 27 mL/min — ABNORMAL LOW (ref >90)
Glucose, Bld: 73 mg/dL (ref 70–99)
Glucose, Bld: 106 mg/dL — ABNORMAL HIGH (ref 70–99)

## 2013-05-31 LAB — CBC
HCT: 32.1 % — ABNORMAL LOW (ref 36.0–46.0)
MCHC: 36.4 g/dL — ABNORMAL HIGH (ref 30.0–36.0)
MCV: 85.1 fL (ref 78.0–100.0)
RDW: 13.7 % (ref 11.5–15.5)

## 2013-05-31 MED ORDER — FLUCONAZOLE 150 MG PO TABS
150.0000 mg | ORAL_TABLET | Freq: Every day | ORAL | Status: DC
  Administered 2013-05-31 – 2013-06-01 (×2): 150 mg via ORAL
  Filled 2013-05-31 (×2): qty 1

## 2013-05-31 MED ORDER — SODIUM CHLORIDE 0.9 % IV SOLN: 250.0000 mL | INTRAVENOUS | Status: DC | PRN

## 2013-05-31 MED ORDER — DOCUSATE SODIUM 100 MG PO CAPS
100.0000 mg | ORAL_CAPSULE | Freq: Every day | ORAL | Status: DC | PRN
  Filled 2013-05-31: qty 1

## 2013-05-31 MED ORDER — AMLODIPINE BESYLATE 5 MG PO TABS
5.0000 mg | ORAL_TABLET | Freq: Every day | ORAL | Status: DC
  Administered 2013-05-31 – 2013-06-01 (×2): 5 mg via ORAL
  Filled 2013-05-31 (×2): qty 1

## 2013-05-31 MED ORDER — SODIUM CHLORIDE 0.9 % IJ SOLN
3.0000 mL | Freq: Two times a day (BID) | INTRAMUSCULAR | Status: DC
  Administered 2013-05-31 (×3): 3 mL via INTRAVENOUS

## 2013-05-31 MED ORDER — FERROUS SULFATE 325 (65 FE) MG PO TABS
325.0000 mg | ORAL_TABLET | Freq: Two times a day (BID) | ORAL | Status: DC
  Administered 2013-05-31 – 2013-06-01 (×3): 325 mg via ORAL
  Filled 2013-05-31 (×4): qty 1

## 2013-05-31 MED ORDER — HEPARIN SODIUM (PORCINE) 5000 UNIT/ML IJ SOLN
5000.0000 [IU] | Freq: Three times a day (TID) | INTRAMUSCULAR | Status: DC
  Administered 2013-05-31 – 2013-06-01 (×4): 5000 [IU] via SUBCUTANEOUS
  Filled 2013-05-31 (×7): qty 1

## 2013-05-31 MED ORDER — CALCIUM CARBONATE-VITAMIN D 500-200 MG-UNIT PO TABS
1.0000 | ORAL_TABLET | Freq: Two times a day (BID) | ORAL | Status: DC
  Administered 2013-05-31 – 2013-06-01 (×3): 1 via ORAL
  Filled 2013-05-31 (×5): qty 1

## 2013-05-31 MED ORDER — ADULT MULTIVITAMIN W/MINERALS CH
1.0000 | ORAL_TABLET | ORAL | Status: DC
  Administered 2013-05-31 – 2013-06-01 (×2): 1 via ORAL
  Filled 2013-05-31 (×3): qty 1

## 2013-05-31 MED ORDER — SODIUM CHLORIDE 0.9 % IJ SOLN: 3.0000 mL | INTRAMUSCULAR | Status: DC | PRN

## 2013-05-31 MED ORDER — SODIUM CHLORIDE 0.9 % IJ SOLN
3.0000 mL | Freq: Two times a day (BID) | INTRAMUSCULAR | Status: DC
  Administered 2013-05-31: 11:00:00 3 mL via INTRAVENOUS

## 2013-05-31 NOTE — Progress Notes (Signed)
Family Medicine Teaching Service Daily Progress Note Intern Pager: [phone]  Patient name: Kathleen [name] Medical record number: [id number] Date of birth: 1922-08-08 Age: 79 y.o. Gender: female  Primary Care Provider: Denny Levy, MD Consultants: None Code Status: Full  Pt Overview and Major Events to Date:   Assessment and Plan: Kathleen [name] is a 79 y.o. female presenting with Weakness. PMH is significant for CKD stage 3, Paroxysmal Afib, 2nd degree heart block type 1, Dementia, HTN, osteoporosis, CHF, anemia and spinal stenosis.  # Weakness  - Global weakness w/o focal/lateralizing neurological signs and dizziness. Appears mildly dehydrated on exam. CT head negative for acute pathology. Found to be hyperkalemic in ED work-up. UA neg  - Most likely multifactorial: Hyperkalemic, CKD w/ anemia, HF with unknown EF, Heart block vs PAF, and Cervical spinal stenosis  - EKG: showed NSR w/ prolonged PR >400; Pt noticed to have transient episodes of bradycardia while interviewing  -could be related to hyperkalemia. Will repeat EKG once potassium improved (though no peaked t waves which is typically noted before pr prolongation). Consider cardiology consult as bradycardia could be contributing though will defer to dayteam  - CT Spine 2011 showed: severe spinal stenosis at C6-7 & coexisting narrowing of the spinal canal at C1  - Admit to telemetry  - ECHO: today due to unknown EF, +1 LE edema, and murmur on exam  - PT & OT to eval and treat  - Check TSH and orthostatics  - WBC wnl; Hgb: 11.7  # Hyperkalemia: resolved  - K 5.9 on Admission. Hx of recent fall, and decreased oral hydration with CKD stage 4  - Most likely due to decreased renal clearance and transcellular shift of K with muscle damage from fall  - EKG: neg for peaked T waves, but PR interval prolonged (hx of Heart block)  - Kayexalate given in ED  - CK: 222 - K: 5.1  # CKD stage 3-4  - Baseline Cr of 1.3-1.5. GFR ~20-30   - Cr1.66 on admission; does not meet criteria for AKI; mildly dehydrate on exam  - BMET: Cr 1.42  - Continue: Calcium, Vit D & Iron   # HTN  - BP: 118/65 on admission  - Continue Norvasc 5mg    FEN/GI: Renal Diet; SLIV; Monitor and treat electrolytes as needed  Prophylaxis: Heparin   Disposition: On tele; d/c home pending cardiac work-up and PT recs   Subjective: She is a little confused this morning, but reports some lower back/butt pain. She denies any chest pain or SOB.   Objective: Temp:  [96.5 F (35.8 C)-98.1 F (36.7 C)] 96.5 F (35.8 C) (11/21 0323) Pulse Rate:  [54-76] 75 (11/21 0324) Resp:  [12-24] 16 (11/21 0323) BP: (99-167)/(58-85) 141/76 mmHg (11/21 0324) SpO2:  [96 %-100 %] 100 % (11/21 0323) Weight:  [111 lb 5.3 oz (50.5 kg)-115 lb (52.164 kg)] 111 lb 5.3 oz (50.5 kg) (11/21 0302) Physical Exam: Gen: Frail elderly F in NAD;  Neuro: A&O only to person CV: RRR, 3/6 murmur w/o radiation to carotids or axilla; No Carotid bruits; No JVD; +1 LE Edema; Pulses 2+ UE & LE  Lungs: CTAB; Normal Resp. Effort  GI: No skin changes; BS + ; No tenderness or masses, No CVA tenderness  Back: No spinal process tenderness; No ulceration  Laboratory:  Recent Labs Lab 05/30/13 1923 05/31/13 0446  WBC 4.4 5.4  HGB 11.4* 11.7*  HCT 31.0* 32.1*  PLT 150 139*    Recent Labs Lab  05/30/13 1923 05/30/13 2208 05/31/13 0446  NA 139  --  146*  K 5.9* 5.9* 5.1  CL 102  --  108  CO2 29  --  30  BUN 54*  --  52*  CREATININE 1.66*  --  1.42*  CALCIUM 9.7  --  9.7  PROT 7.9  --   --   BILITOT 0.1*  --   --   ALKPHOS 89  --   --   ALT 30  --   --   AST 35  --   --   GLUCOSE 84  --  73   Recent Results (from the past 2160 hour(s))  CBC WITH DIFFERENTIAL     Status: Abnormal   Collection Time    05/30/13  7:23 PM      Result Value Range   WBC 4.4  4.0 - 10.5 K/uL   RBC 3.67 (*) 3.87 - 5.11 MIL/uL   Hemoglobin 11.4 (*) 12.0 - 15.0 g/dL   HCT 40.9 (*) 81.1 - 91.4 %    MCV 84.5  78.0 - 100.0 fL   MCH 31.1  26.0 - 34.0 pg   MCHC 36.8 (*) 30.0 - 36.0 g/dL   RDW 78.2  95.6 - 21.3 %   Platelets 150  150 - 400 K/uL   Neutrophils Relative % 61  43 - 77 %   Neutro Abs 2.7  1.7 - 7.7 K/uL   Lymphocytes Relative 35  12 - 46 %   Lymphs Abs 1.5  0.7 - 4.0 K/uL   Monocytes Relative 4  3 - 12 %   Monocytes Absolute 0.2  0.1 - 1.0 K/uL   Eosinophils Relative 1  0 - 5 %   Eosinophils Absolute 0.0  0.0 - 0.7 K/uL   Basophils Relative 0  0 - 1 %   Basophils Absolute 0.0  0.0 - 0.1 K/uL  COMPREHENSIVE METABOLIC PANEL     Status: Abnormal   Collection Time    05/30/13  7:23 PM      Result Value Range   Sodium 139  135 - 145 mEq/L   Potassium 5.9 (*) 3.5 - 5.1 mEq/L   Chloride 102  96 - 112 mEq/L   CO2 29  19 - 32 mEq/L   Glucose, Bld 84  70 - 99 mg/dL   BUN 54 (*) 6 - 23 mg/dL   Creatinine, Ser 0.86 (*) 0.50 - 1.10 mg/dL   Calcium 9.7  8.4 - 57.8 mg/dL   Total Protein 7.9  6.0 - 8.3 g/dL   Albumin 3.4 (*) 3.5 - 5.2 g/dL   AST 35  0 - 37 U/L   ALT 30  0 - 35 U/L   Alkaline Phosphatase 89  39 - 117 U/L   Total Bilirubin 0.1 (*) 0.3 - 1.2 mg/dL   GFR calc non Af Amer 27 (*) >90 mL/min   GFR calc Af Amer 31 (*) >90 mL/min   Comment: (NOTE)     The eGFR has been calculated using the CKD EPI equation.     This calculation has not been validated in all clinical situations.     eGFR's persistently <90 mL/min signify possible Chronic Kidney     Disease.  POTASSIUM     Status: Abnormal   Collection Time    05/30/13 10:08 PM      Result Value Range   Potassium 5.9 (*) 3.5 - 5.1 mEq/L  URINALYSIS, ROUTINE W REFLEX MICROSCOPIC  Status: Abnormal   Collection Time    05/30/13 10:32 PM      Result Value Range   Color, Urine YELLOW  YELLOW   APPearance CLEAR  CLEAR   Specific Gravity, Urine 1.009  1.005 - 1.030   pH 7.0  5.0 - 8.0   Glucose, UA NEGATIVE  NEGATIVE mg/dL   Hgb urine dipstick NEGATIVE  NEGATIVE   Bilirubin Urine NEGATIVE  NEGATIVE   Ketones, ur  NEGATIVE  NEGATIVE mg/dL   Protein, ur NEGATIVE  NEGATIVE mg/dL   Urobilinogen, UA 0.2  0.0 - 1.0 mg/dL   Nitrite NEGATIVE  NEGATIVE   Leukocytes, UA TRACE (*) NEGATIVE  URINE MICROSCOPIC-ADD ON     Status: None   Collection Time    05/30/13 10:32 PM      Result Value Range   Squamous Epithelial / LPF RARE  RARE   WBC, UA 0-2  <3 WBC/hpf   RBC / HPF 0-2  <3 RBC/hpf   Urine-Other RARE YEAST    BASIC METABOLIC PANEL     Status: Abnormal   Collection Time    05/31/13  4:46 AM      Result Value Range   Sodium 146 (*) 135 - 145 mEq/L   Potassium 5.1  3.5 - 5.1 mEq/L   Chloride 108  96 - 112 mEq/L   CO2 30  19 - 32 mEq/L   Glucose, Bld 73  70 - 99 mg/dL   BUN 52 (*) 6 - 23 mg/dL   Creatinine, Ser 7.82 (*) 0.50 - 1.10 mg/dL   Calcium 9.7  8.4 - 95.6 mg/dL   GFR calc non Af Amer 32 (*) >90 mL/min   GFR calc Af Amer 37 (*) >90 mL/min   Comment: (NOTE)     The eGFR has been calculated using the CKD EPI equation.     This calculation has not been validated in all clinical situations.     eGFR's persistently <90 mL/min signify possible Chronic Kidney     Disease.  CBC     Status: Abnormal   Collection Time    05/31/13  4:46 AM      Result Value Range   WBC 5.4  4.0 - 10.5 K/uL   RBC 3.77 (*) 3.87 - 5.11 MIL/uL   Hemoglobin 11.7 (*) 12.0 - 15.0 g/dL   HCT 21.3 (*) 08.6 - 57.8 %   MCV 85.1  78.0 - 100.0 fL   MCH 31.0  26.0 - 34.0 pg   MCHC 36.4 (*) 30.0 - 36.0 g/dL   RDW 46.9  62.9 - 52.8 %   Platelets 139 (*) 150 - 400 K/uL  CK     Status: Abnormal   Collection Time    05/31/13  4:46 AM      Result Value Range   Total CK 222 (*) 7 - 177 U/L   Imaging/Diagnostic Tests: CT Head IMPRESSION:  1. No acute intracranial process.  2. 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  Wenda Low, MD 05/31/2013, 9:44 AM PGY-1, [facility] FPTS Intern pager: [phone], text pages welcome

## 2013-05-31 NOTE — Progress Notes (Signed)
Echo Lab  2D Echocardiogram completed.  Dartanyan Deasis L Delanna Blacketer, RDCS 05/31/2013 3:22 PM

## 2013-05-31 NOTE — Progress Notes (Signed)
UR completed 

## 2013-05-31 NOTE — H&P (Signed)
Family Medicine Teaching [facility] Admission History and Physical Service Pager: [phone]  Patient name: Kathleen [name] Medical record number: [id number] Date of birth: 1923/04/26 Age: 79 y.o. Gender: female  Primary Care Provider: Denny Levy, MD Consultants: None Code Status: Full  Chief Complaint: Weakness  Assessment and Plan: Kathleen [name] is a 79 y.o. female presenting with Weakness. PMH is significant for CKD stage 3, Paroxysmal Afib, 2nd degree heart block type 1, Dementia, HTN, osteoporosis, CHF, anemia and spinal stenosis.   # Weakness - Global weakness w/o focal/lateralizing neurological signs and dizziness. Appears mildly dehydrated on exam. CT head negative for acute pathology. Found to be hyperkalemic in ED work-up. UA neg - Most likely multifactorial: Hyperkalemic, CKD w/ anemia, HF with unknown EF, Heart block vs PAF, and Cervical spinal stenosis - EKG: showed NSR w/ prolonged PR >400; Pt noticed to have transient episodes of bradycardia while interviewing -could be related to hyperkalemia. Will repeat EKG once potassium improved (though no peaked t waves which is typically noted before pr prolongation). Consider cardiology consult as bradycardia could be contributing though will defer to dayteam - CT Spine 2011 showed: severe spinal stenosis at C6-7 & coexisting narrowing of the spinal canal at C1 - Admit to telemetry   - ECHO: ordered; due to unknown EF, +1 LE edema, and murmur on exam - PT & OT to eval and treat - Check TSH and orthostatics - Check CBC in am  # Hyperkalemia - K 5.9 on Admission. Hx of recent fall, and decreased oral hydration with CKD stage 4 - Most likely due to decreased renal clearance and transcellular shift of K with muscle damage from fall - EKG: neg for peaked T waves, but PR interval prolonged (hx of Heart block) - Kayexalate given in ED - CK order given hx of fall - Recheck K in am  # CKD stage 3-4 - Baseline Cr of  1.3-1.5. GFR ~20-30 - Cr1.66 on admission; does not meet criteria for AKI; mildly dehydrate on exam - Check BMET in am - Continue: Calcium, Vit D & Iron  # HTN - BP: 118/65 on admission - Continue Norvasc 5mg    FEN/GI: Renal Diet; SLIV; Monitor and treat electrolytes as needed  Prophylaxis: Heparin  Disposition: Admit to tele;   History of Present Illness: Kathleen [name] is a 79 y.o. female presenting with weakness and slight tremor. Family reports gradually worsening global weakness over the past few days that begain  Wednesday night after she feel going to the restroom at night. Today they noticed slight tremor, mild shaking and some dizziness that prompted the ED visit. Ms Cadden reports falling while attempting to transfer to the toilet. She says she was wearing socks, slipped and fell. She denies losing consciousness or hitting her head. She reports some gluteal soreness, but has been able to stand and transfer with her usually assistance. She has baseline weakness requiring her to use walker, and has 24 hr family support in the house, but insist on doing something independently. She has not taking any medication for pain since the fall, or started any medications recently. She has been a little confused recently, and family reports hx of confusion with UTIs in the past. She denies any abdominal pain, or burning with urination. Family report decreased PO intake because she doesn't want to have to go the the restroom often. She denies any Chest pain, but has noticed some SOB recently. She has a Hx of spinal stenosis  ED: CT  neg for acute pathology; Hgb 11.4; K 5.9, Cr 1.66  Review Of Systems: Per HPI with the following additions:  Otherwise 12 point review of systems was performed and was unremarkable.  Patient Active Problem List   Diagnosis Date Noted  . Difficulty walking 03/06/2013  . Pressure ulcer stage II 03/06/2013  . 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 Wenckebach second degree AV  block 02/22/2013  . Acute pyelonephritis 02/19/2013  . Body temperature low 02/18/2013  . Irregular bowel habits 11/08/2012  . At high risk for falls 10/02/2012  . Anemia 02/24/2012  . Generalized OA 12/16/2011  . Unspecified conditions influencing health status 12/16/2011  . Dementia 10/13/2010  . Paroxysmal atrial fibrillation 10/13/2010  . Hypertension 10/13/2010  . Kidney disease, chronic, stage III (GFR 30-59 ml/min) 10/13/2010  . Osteoporosis 10/13/2010  . CHF (congestive heart failure) 10/13/2010   Past Medical History: Past Medical History  Diagnosis Date  . Hypertension   . Arthritis   . Spinal stenosis   . Osteoarthritis   . Anemia   . CHF (congestive heart failure)   . Heart murmur     aortic regurg  . Osteoporosis   . Chronic kidney disease   . Generalized OA 12/16/2011    Significant. Seems to have encompass all joints. Patient is complaining of significant pain but dementia allows patient to be happy.   Past Surgical History: Past Surgical History  Procedure Laterality Date  . Nephrectomy Right   . Tonsillectomy     Social History: History  Substance Use Topics  . Smoking status: Former Games developer  . Smokeless tobacco: Never Used     Comment: quit smoking in the 70's  . Alcohol Use: No   Additional social history: Lives with Daughter at home who provided care with assistance of Granddaughter ro  Please also refer to relevant sections of EMR.  Family History: Family History  Problem Relation Age of Onset  . Hypertension Daughter    Allergies and Medications: No Known Allergies No current facility-administered medications on file prior to encounter.   Current Outpatient Prescriptions on File Prior to Encounter  Medication Sig Dispense Refill  . amLODipine (NORVASC) 5 MG tablet Take 1 tablet (5 mg total) by mouth daily.  30 tablet  3  . calcium-vitamin D (OSCAL WITH D) 500-200 MG-UNIT per tablet Take 1 tablet by mouth 2 (two) times daily.      Marland Kitchen  docusate sodium (COLACE) 100 MG capsule Take 1 capsule (100 mg total) by mouth daily as needed for constipation.  10 capsule    . ferrous sulfate (FERROUSUL) 325 (65 FE) MG tablet Take 325 mg by mouth 2 (two) times daily.       . Multiple Vitamins-Minerals (SENIOR MULTIVITAMIN PLUS) TABS Take 1 tablet by mouth 1 dose over 24 hours.  100 tablet  12    Objective: BP 122/67  Pulse 71  Temp(Src) 98.1 F (36.7 C) (Oral)  Resp 19  Ht 4\' 11"  (1.499 m)  Wt 115 lb (52.164 kg)  BMI 23.21 kg/m2  SpO2 100% Exam: Gen: Frail elderly F in NAD; Neuro: A&Ox3, not to time; Short & Long term memory intact; CN2-12 intact; Gross Sensory & Motor intact CV: RRR, 3/6 murmur w/o radiation to carotids or axilla; No Carotid bruits; No JVD; +1 LE Edema; Pulses 2+ UE & LE Lungs: CTAB; Normal Resp. Effort GI: No skin changes; BS + ; No tenderness or masses, No CVA tenderness Back: No spinal process tenderness; No ulceration  Labs and Imaging:  Results for orders placed during the hospital encounter of 05/30/13 (from the past 24 hour(s))  CBC WITH DIFFERENTIAL     Status: Abnormal   Collection Time    05/30/13  7:23 PM      Result Value Range   WBC 4.4  4.0 - 10.5 K/uL   RBC 3.67 (*) 3.87 - 5.11 MIL/uL   Hemoglobin 11.4 (*) 12.0 - 15.0 g/dL   HCT 40.9 (*) 81.1 - 91.4 %   MCV 84.5  78.0 - 100.0 fL   MCH 31.1  26.0 - 34.0 pg   MCHC 36.8 (*) 30.0 - 36.0 g/dL   RDW 78.2  95.6 - 21.3 %   Platelets 150  150 - 400 K/uL   Neutrophils Relative % 61  43 - 77 %   Neutro Abs 2.7  1.7 - 7.7 K/uL   Lymphocytes Relative 35  12 - 46 %   Lymphs Abs 1.5  0.7 - 4.0 K/uL   Monocytes Relative 4  3 - 12 %   Monocytes Absolute 0.2  0.1 - 1.0 K/uL   Eosinophils Relative 1  0 - 5 %   Eosinophils Absolute 0.0  0.0 - 0.7 K/uL   Basophils Relative 0  0 - 1 %   Basophils Absolute 0.0  0.0 - 0.1 K/uL  COMPREHENSIVE METABOLIC PANEL     Status: Abnormal   Collection Time    05/30/13  7:23 PM      Result Value Range   Sodium 139   135 - 145 mEq/L   Potassium 5.9 (*) 3.5 - 5.1 mEq/L   Chloride 102  96 - 112 mEq/L   CO2 29  19 - 32 mEq/L   Glucose, Bld 84  70 - 99 mg/dL   BUN 54 (*) 6 - 23 mg/dL   Creatinine, Ser 0.86 (*) 0.50 - 1.10 mg/dL   Calcium 9.7  8.4 - 57.8 mg/dL   Total Protein 7.9  6.0 - 8.3 g/dL   Albumin 3.4 (*) 3.5 - 5.2 g/dL   AST 35  0 - 37 U/L   ALT 30  0 - 35 U/L   Alkaline Phosphatase 89  39 - 117 U/L   Total Bilirubin 0.1 (*) 0.3 - 1.2 mg/dL   GFR calc non Af Amer 27 (*) >90 mL/min   GFR calc Af Amer 31 (*) >90 mL/min  POTASSIUM     Status: Abnormal   Collection Time    05/30/13 10:08 PM      Result Value Range   Potassium 5.9 (*) 3.5 - 5.1 mEq/L  URINALYSIS, ROUTINE W REFLEX MICROSCOPIC     Status: Abnormal   Collection Time    05/30/13 10:32 PM      Result Value Range   Color, Urine YELLOW  YELLOW   APPearance CLEAR  CLEAR   Specific Gravity, Urine 1.009  1.005 - 1.030   pH 7.0  5.0 - 8.0   Glucose, UA NEGATIVE  NEGATIVE mg/dL   Hgb urine dipstick NEGATIVE  NEGATIVE   Bilirubin Urine NEGATIVE  NEGATIVE   Ketones, ur NEGATIVE  NEGATIVE mg/dL   Protein, ur NEGATIVE  NEGATIVE mg/dL   Urobilinogen, UA 0.2  0.0 - 1.0 mg/dL   Nitrite NEGATIVE  NEGATIVE   Leukocytes, UA TRACE (*) NEGATIVE  URINE MICROSCOPIC-ADD ON     Status: None   Collection Time    05/30/13 10:32 PM      Result Value Range   Squamous Epithelial / LPF  RARE  RARE   WBC, UA 0-2  <3 WBC/hpf   RBC / HPF 0-2  <3 RBC/hpf   Urine-Other RARE YEAST     CT Head IMPRESSION:  1. No acute intracranial process.  2. 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  Wenda Low, MD 05/31/2013, 1:39 AM PGY-1, [facility] FPTS Intern pager: [phone], text pages welcome  Family Medicine Upper Level Addendum:   I have seen and examined the patient independently, discussed with Dr. Gayla Doss, fully reviewed the H+P and agree with it's contents with the additions as noted in blue  text.   Tana Conch, MD, PGY-3 05/31/2013 7:16 AM

## 2013-05-31 NOTE — Progress Notes (Signed)
Patient is noted as being confused by staff and attending MD.  Placed call to daughter Abundio Miu to explain [facility] services.  Left message requesting call back.  Left [facility] literature at bedside with request to staff to provide for the family when they visit.  Of note, [facility] Care Management services does not replace or interfere with any services that are arranged by inpatient case management or social work.  For additional questions or referrals please contact Anibal Henderson BSN RN [facility] Liaison at [phone].

## 2013-05-31 NOTE — Evaluation (Signed)
Physical Therapy Evaluation Patient Details Name: Kathleen [name] MRN: [id number] DOB: February 12, 1923 Today's Date: 05/31/2013 Time: 4132-4401 PT Time Calculation (min): 25 min  PT Assessment / Plan / Recommendation History of Present Illness  Pt admit with hyperkalemia.  Clinical Impression  Pt admitted with above. Pt currently with functional limitations due to the deficits listed below (see PT Problem List). Pt will need NHP with therapy.  Pt will benefit from skilled PT to increase their independence and safety with mobility to allow discharge to the venue listed below.     PT Assessment  Patient needs continued PT services    Follow Up Recommendations  SNF;Supervision/Assistance - 24 hour        Barriers to Discharge Decreased caregiver support      Equipment Recommendations  None recommended by PT         Frequency Min 2X/week    Precautions / Restrictions Precautions Precautions: Fall Restrictions Weight Bearing Restrictions: No   Pertinent Vitals/Pain VSS, No pain      Mobility  Bed Mobility Bed Mobility: Rolling Left;Left Sidelying to Sit;Sitting - Scoot to [place] of Bed Rolling Left: 3: Mod assist;With rail Left Sidelying to Sit: 3: Mod assist;With rails Sitting - Scoot to Edge of Bed: 3: Mod assist;With rail Details for Bed Mobility Assistance: Needed assist to move LEs off bed and to elevate trunk as well as use of pad to scoot to EOB.   Transfers Transfers: Sit to Stand;Stand to Sit;Stand Pivot Transfers Sit to Stand: 3: Mod assist;With upper extremity assist;From bed Stand to Sit: 3: Mod assist;With upper extremity assist;With armrests;To bed;To chair/3-in-1 Stand Pivot Transfers: 3: Mod assist Details for Transfer Assistance: Pt soaked with urine on arrival.  Pt needed cues for hand placement.  Unable to stand fully upright.  Pt stood and pivoted to 3N1 and then back to bed.  Flexed posture with transfer using RW with assist and cues for sequencing RW as  well as assist to move RW.  Assist to weight shift as well.  As pt fatigued trunk flexion worsended.  Nursing student cleaned pts bottom as well as we changed all linens and gown.   Ambulation/Gait Ambulation/Gait Assistance: Not tested (comment) Stairs: No Wheelchair Mobility Wheelchair Mobility: No         PT Diagnosis: Generalized weakness  PT Problem List: Decreased activity tolerance;Decreased balance;Decreased mobility;Decreased knowledge of use of DME;Decreased safety awareness;Decreased knowledge of precautions PT Treatment Interventions: Gait training;DME instruction;Functional mobility training;Therapeutic activities;Therapeutic exercise;Balance training;Patient/family education     PT Goals(Current goals can be found in the care plan section) Acute Rehab PT Goals Patient Stated Goal: to go to NH PT Goal Formulation: With patient Time For Goal Achievement: 06/07/13 Potential to Achieve Goals: Fair  Visit Information  Last PT Received On: 05/31/13 Assistance Needed: +2 History of Present Illness: Pt admit with hyperkalemia.       Prior Functioning  Home Living Family/patient expects to be discharged to:: Skilled nursing facility Living Arrangements: Children;Other relatives Additional Comments: pt resides at [facility] Prior Function Level of Independence: Needs assistance Gait / Transfers Assistance Needed: short distance amb with RW with assist, uses w/c majority of the time ADL's / Homemaking Assistance Needed: A with bathing and dressing Communication Communication: No difficulties Dominant Hand: Right    Cognition  Cognition Arousal/Alertness: Awake/alert Behavior During Therapy: WFL for tasks assessed/performed Overall Cognitive Status: History of cognitive impairments - at baseline Memory: Decreased short-term memory    Extremity/Trunk Assessment Upper Extremity Assessment Upper Extremity  Assessment: Defer to OT evaluation Lower Extremity  Assessment Lower Extremity Assessment: Generalized weakness Cervical / Trunk Assessment Cervical / Trunk Assessment: Kyphotic   Balance Balance Balance Assessed: Yes Static Sitting Balance Static Sitting - Balance Support: Bilateral upper extremity supported;Feet supported Static Sitting - Level of Assistance: 5: Stand by assistance Static Sitting - Comment/# of Minutes: 3 Static Standing Balance Static Standing - Balance Support: Bilateral upper extremity supported;During functional activity Static Standing - Level of Assistance: 3: Mod assist Static Standing - Comment/# of Minutes: 3 min to be cleaned using RW for support  End of Session PT - End of Session Equipment Utilized During Treatment: Gait belt Activity Tolerance: Patient limited by fatigue Patient left: with call bell/phone within reach;in bed;with bed alarm set Nurse Communication: Mobility status  GP Functional Assessment Tool Used: Clinical judgement Functional Limitation: Mobility: Walking and moving around Mobility: Walking and Moving Around Current Status (H0865): At least 40 percent but less than 60 percent impaired, limited or restricted Mobility: Walking and Moving Around Goal Status [phone]): At least 20 percent but less than 40 percent impaired, limited or restricted   INGOLD,Aaditya Letizia 05/31/2013, 3:05 PM [facility] Acute Rehabilitation [phone] (pager)

## 2013-05-31 NOTE — H&P (Signed)
FMTS Attending Admission Note: Kehinde Eniola,MD I  have seen and examined this patient, reviewed their chart. I have discussed this patient with the resident. I agree with the resident's findings, assessment and care plan.  Briefly: Patient brought in by her daughter for hx of weakness and confusion at home as well as fall episode, i could not obtain much hx from patient but she denies any pain,no SOB,no chest pain, feels well. Said her daughter brought her here.  Filed Vitals:   05/31/13 0230 05/31/13 0302 05/31/13 0323 05/31/13 0324  BP: 129/66  132/75 141/76  Pulse: 65  70 75  Temp:   96.5 F (35.8 C)   TempSrc:   Axillary   Resp: 17  16   Height:  4\' 11"  (1.499 m)    Weight:  111 lb 5.3 oz (50.5 kg)    SpO2: 97%  100%    Exam: Gen: Calm in bed,not in distress, well hydrated. Neuro: Awake and alerted, not oriented to place,said she is in [place]. HEENT: EOMI,PERRLA CV: S1 S2 with 2-3/6 systolic murmur. Resp: Air entry equal b/L no wheezing or crepitations. Abd: benign, NT/ND, BS normal. Ext: No edema appreciated this morning.  A/P; 79 Y/O Female with 1. Hyperkalemia: S/P Kayexalate.    Recheck K+ this morning. 2. Georgeann Oppenheim Fall:  May be age related vs electrolyte imbalance vs DJD. Imaging reviewed     Fall precaution.     Gentle hydration.     R/O postural hypotension with orthostatic BP check.     PT/OT assessment recommended 3. HTN; Seem stable on home medication.    Monitor BP closely due to hx of fall.  4. Hx of UTI:     Urine dipstick neg for leukocyte and nitrite,+ rare yeast.     Recommended Diflucan 150 mg X 1     Recheck UA before d/c from hospital.  5. CV: Paroxysmal Afib, Wenckebach 2nd degree AV block, CHF    No record of cardiology assessment recently.    As dicussed with the on call team, I recommended cardiology assessment at one point.    Telemetry monitoring recommended.    ASA and Beta blocker recommended if BP can tolerate it.     6. RUL  lesion: CXRAY with possible granuloma of chronic pulmonary hemorrhage.     Patient is asymptomatic.     I recommended clarification of report with radiology.     Consider CT chest for better assessment.     Signed out to Dr Deirdre Priest to follow.  Patient signed out to Dr Deirdre Priest.

## 2013-05-31 NOTE — Progress Notes (Signed)
Family Medicine Teaching Service Attending Note  I discussed patient Kathleen [name]  with Dr. Gayla Doss and reviewed their note for today.  I agree with their assessment and plan.

## 2013-05-31 NOTE — ED Provider Notes (Signed)
I saw and evaluated the patient, reviewed the resident's note and I agree with the findings and plan.  Tanyika Barros T Julieth Tugman, MD 05/31/13 1538 

## 2013-05-31 NOTE — Discharge Summary (Signed)
Family Medicine Teaching [facility] Discharge Summary  Patient name: Kathleen [name] Medical record number: [id number] Date of birth: Feb 12, 1923 Age: 79 y.o. Gender: female Date of Admission: 05/30/2013  Date of Discharge: 06/01/13 Admitting Physician: Janit Pagan, MD  Primary Care Provider: Denny Levy, MD Consultants: None  Indication for Hospitalization: Hypokalemia and Weakness  Discharge Diagnoses/Problem List:  Patient Active Problem List   Diagnosis Date Noted  . Difficulty walking 03/06/2013  . Pressure ulcer stage II 03/06/2013  . 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 Acute pyelonephritis 02/19/2013  . Body temperature low 02/18/2013  . Irregular bowel habits 11/08/2012  . At high risk for falls 10/02/2012  . Anemia 02/24/2012  . Paroxysmal atrial fibrillation 10/13/2010  . Hypertension 10/13/2010  . Kidney disease, chronic, stage III (GFR 30-59 ml/min) 10/13/2010  . Osteoporosis 10/13/2010  . CHF (congestive heart failure) 10/13/2010   Disposition: Home  Discharge Condition: Stable  Brief Hospital Course: Kathleen [name] is a 79 y.o. female who presented with Weakness, mild tremor and hyperkalemia. PMH is significant for CKD stage 3, Paroxysmal Afib, 2nd degree heart block type 1, Dementia, HTN, osteoporosis, CHF, anemia and spinal stenosis. She was monitored overnight on telemetry without any events, and her potassium corrected. She was evaluated by PT and OT who recommended SNF, but family decided to her patient home. Due to Hx of HF w/ unknown EF, LE edema and heart murmur an ECHO was obtained that showed LVH w normal EF.   Weakness: Most likely multifactorial due to physical deconditioning, hyperkalemic, CKD w/ anemia, HF, Heart block vs PAF, and Cervical spinal stenosis. A CT head showed no acute pathology. Her potassium was corrected and PT & OT recommended SNF. ECHO showed LVH with normal EF.   Pulmonary Granuloma  vs Chronic hemorrhage: Radiologist contacted and no follow-up recommended  CKD stage 3: Baseline Cr ~ 1.4 with GFR ~ 30. Cr was 1.66 on admission and decreased back to baseline (1.42) at discharge  Issues for Follow Up:  1. Fall risk / prevention  Significant Procedures: None  Significant Labs and Imaging:   Recent Labs Lab 05/30/13 1923 05/31/13 0446  WBC 4.4 5.4  HGB 11.4* 11.7*  HCT 31.0* 32.1*  PLT 150 139*    Recent Labs Lab 05/30/13 1923  05/31/13 0446 05/31/13 1946  NA 139  --  146* 141  K 5.9*  < > 5.1 4.6  CL 102  --  108 102  CO2 29  --  30 32  GLUCOSE 84  --  73 106*  BUN 54*  --  52* 45*  CREATININE 1.66*  --  1.42* 1.64*  CALCIUM 9.7  --  9.7 9.9  ALKPHOS 89  --   --   --   AST 35  --   --   --   ALT 30  --   --   --   ALBUMIN 3.4*  --   --   --   < > = values in this interval not displayed.  Results/Tests Pending at Time of Discharge: None  Discharge Medications:    Medication List         amLODipine 5 MG tablet  Commonly known as:  NORVASC  Take 1 tablet (5 mg total) by mouth daily.     aspirin 81 MG tablet  Take 1 tablet (81 mg total) by mouth daily.     calcium-vitamin D 500-200 MG-UNIT per tablet  Commonly known as:  OSCAL WITH D  Take 1 tablet by mouth 2 (two) times daily.     docusate sodium 100 MG capsule  Commonly known as:  COLACE  Take 1 capsule (100 mg total) by mouth daily as needed for constipation.     FERROUSUL 325 (65 FE) MG tablet  Generic drug:  ferrous sulfate  Take 325 mg by mouth 2 (two) times daily.     SENIOR MULTIVITAMIN PLUS Tabs  Take 1 tablet by mouth 1 dose over 24 hours.       Discharge Instructions: Please refer to Patient Instructions section of EMR for full details.  Patient was counseled important signs and symptoms that should prompt return to medical care, changes in medications, dietary instructions, activity restrictions, and follow up appointments.   Follow-Up Appointments: Follow-up  Information   Follow up with Denny Levy, MD. Schedule an appointment as soon as possible for a visit in 1 week.   Specialties:  Family Medicine, Sports Medicine   Contact information:   [address] [place] [address] [phone]       Follow-up Information   Follow up with Denny Levy, MD. Schedule an appointment as soon as possible for a visit in 1 week.   Specialties:  Family Medicine, Sports Medicine   Contact information:   [address] [place] [address] [phone]      Wenda Low, MD 06/02/2013, 11:08 PM PGY-1, [facility]

## 2013-05-31 NOTE — Progress Notes (Signed)
Nutrition Brief Note  Patient identified on the Malnutrition Screening Tool (MST) Report for recent weight lost without trying (patient unsure).  Per readings below, patient has had a 6% weight loss since mid-August 2014; not significant for time frame.  Wt Readings from Last 15 Encounters:  05/31/13 111 lb 5.3 oz (50.5 kg)  04/26/13 115 lb (52.164 kg)  03/06/13 115 lb (52.164 kg)  02/26/13 118 lb (53.524 kg)  02/22/13 113 lb 1.5 oz (51.3 kg)  01/11/13 126 lb 9.6 oz (57.425 kg)  11/21/12 118 lb 9.6 oz (53.797 kg)  09/29/11 99 lb 9.6 oz (45.178 kg)  06/22/11 105 lb 8 oz (47.854 kg)  12/09/10 113 lb (51.256 kg)  10/13/10 119 lb 8 oz (54.205 kg)    Body mass index is 22.47 kg/(m^2). Patient meets criteria for Normal based on current BMI.   Current diet order is Renal 60/70-08-12-1198 ml, patient is consuming approximately 100% of meals at this time. Labs and medications reviewed.   No nutrition interventions warranted at this time. If nutrition issues arise, please consult RD.   Maureen Chatters, RD, LDN Pager #: [phone] After-Hours Pager #: [phone]

## 2013-06-01 MED ORDER — ASPIRIN 81 MG PO TABS: 81.0000 mg | ORAL_TABLET | Freq: Every day | ORAL | Status: AC

## 2013-06-01 NOTE — Progress Notes (Signed)
D/C Tele, D/C IV, pt. D/C instructions and home medications info provided to pt. And Pt.'s daughter/caregiver. Daughter verbalized understanding. Pt. Left unit via daughter and her personal wheelchair.

## 2013-06-01 NOTE — Progress Notes (Signed)
Family Medicine Teaching Service Attending Note  I interviewed and examined patient [facility] and reviewed their tests and x-rays.  I discussed with Dr. Gwenlyn Saran and reviewed their note for today.  I agree with their assessment and plan.     Additionally  Stable According to her daughter her mental status and function is at baseline Stable for discharge

## 2013-06-01 NOTE — Progress Notes (Signed)
Family Medicine Teaching Service Daily Progress Note Intern Pager: [phone]  Patient name: Kathleen [name] Medical record number: [id number] Date of birth: 08-Aug-1922 Age: 79 y.o. Gender: female  Primary Care Provider: Denny Levy, MD Consultants: none Code Status: Full  Pt Overview and Major Events to Date:  11/20: admitted for weakness  Assessment and Plan:  Kathleen [name] is a 79 y.o. female presenting with Weakness. PMH is significant for CKD stage 3, Paroxysmal Afib, 2nd degree heart block type 1, Dementia, HTN, osteoporosis, CHF, anemia and spinal stenosis.   # Weakness: likely multifactorial from hyperkalemia, CKD with anemia, bradycardia and 2nd degreed heart block.  Resolved on discharge.  - Global weakness w/o focal/lateralizing neurological signs and dizziness. Appears mildly dehydrated on exam. CT head negative for acute pathology. Found to be hyperkalemic in ED work-up. UA neg   - EKG: showed NSR w/ prolonged PR >400; Pt noticed to have transient episodes of bradycardia while interviewing  - CT Spine 2011 showed: severe spinal stenosis at C6-7 & coexisting narrowing of the spinal canal at C1   - PT & OT to eval and treat  - TSH normal - WBC wnl; Hgb: 11.7   # Hyperkalemia: resolved  - K 5.9 on Admission. Hx of recent fall, and decreased oral hydration with CKD stage 4  - Most likely due to decreased renal clearance and transcellular shift of K with muscle damage from fall  - EKG: neg for peaked T waves, but PR interval prolonged (hx of Heart block)  - Kayexalate given in ED  - K normalized to 4.6 on discharge.   # Av node block: 1st degree AV node block on admission with PR interval greater than 400.  - repeat EKG in am showing PR improvement at 310. Tele showing 1st AV node block and heart rates ranging between low 50's to 70's.   # CHF: diastolic: conserved EF of 55-65% with severe LVH on echo from 11/21.  - euvolemic on day of discharge.  - continue BP  control with amlodipine  # CKD stage 3-4  - Baseline Cr of 1.45-2.09. Cr at baseline at 1.64 - Continue: Calcium, Vit D & Iron   # HTN  - Continue Norvasc 5mg    FEN/GI: renal diet PPx: heparin  Disposition: discharge home today  Subjective: feeling well. Wants to go home  Objective: Temp:  [97.1 F (36.2 C)-97.2 F (36.2 C)] 97.1 F (36.2 C) (11/22 0509) Pulse Rate:  [67-77] 69 (11/22 0509) Resp:  [15-18] 18 (11/22 0509) BP: (133-145)/(65-76) 136/69 mmHg (11/22 0509) SpO2:  [99 %-100 %] 100 % (11/22 0509) Weight:  [110 lb 7.2 oz (50.1 kg)] 110 lb 7.2 oz (50.1 kg) (11/22 0509) Physical Exam: General: no acute distress, sitting on edge of bed Cardiovascular: S1S2, 3/6 systolic murmur best heard at right sternal border Respiratory: clear to ausculation Extremities: no edema  Laboratory:  Recent Labs Lab 05/30/13 1923 05/31/13 0446  WBC 4.4 5.4  HGB 11.4* 11.7*  HCT 31.0* 32.1*  PLT 150 139*    Recent Labs Lab 05/30/13 1923 05/30/13 2208 05/31/13 0446 05/31/13 1946  NA 139  --  146* 141  K 5.9* 5.9* 5.1 4.6  CL 102  --  108 102  CO2 29  --  30 32  BUN 54*  --  52* 45*  CREATININE 1.66*  --  1.42* 1.64*  CALCIUM 9.7  --  9.7 9.9  PROT 7.9  --   --   --  BILITOT 0.1*  --   --   --   ALKPHOS 89  --   --   --   ALT 30  --   --   --   AST 35  --   --   --   GLUCOSE 84  --  73 106*    Kathleen Skinner, MD 06/01/2013, 9:07 AM PGY-3, [place] [facility] FPTS Intern pager: [phone], text pages welcome

## 2013-06-04 NOTE — Discharge Summary (Signed)
I have reviewed this discharge summary and agree.    

## 2013-06-11 ENCOUNTER — Encounter (HOSPITAL_COMMUNITY): Payer: Self-pay | Admitting: Emergency Medicine

## 2013-06-11 DIAGNOSIS — I509 Heart failure, unspecified: Secondary | ICD-10-CM | POA: Insufficient documentation

## 2013-06-11 DIAGNOSIS — M159 Polyosteoarthritis, unspecified: Secondary | ICD-10-CM | POA: Insufficient documentation

## 2013-06-11 DIAGNOSIS — T68XXXA Hypothermia, initial encounter: Secondary | ICD-10-CM | POA: Insufficient documentation

## 2013-06-11 DIAGNOSIS — Z7982 Long term (current) use of aspirin: Secondary | ICD-10-CM | POA: Insufficient documentation

## 2013-06-11 DIAGNOSIS — F0390 Unspecified dementia without behavioral disturbance: Principal | ICD-10-CM | POA: Insufficient documentation

## 2013-06-11 DIAGNOSIS — X31XXXA Exposure to excessive natural cold, initial encounter: Secondary | ICD-10-CM | POA: Insufficient documentation

## 2013-06-11 DIAGNOSIS — N183 Chronic kidney disease, stage 3 unspecified: Secondary | ICD-10-CM | POA: Insufficient documentation

## 2013-06-11 DIAGNOSIS — R7309 Other abnormal glucose: Secondary | ICD-10-CM | POA: Insufficient documentation

## 2013-06-11 DIAGNOSIS — R634 Abnormal weight loss: Secondary | ICD-10-CM | POA: Insufficient documentation

## 2013-06-11 DIAGNOSIS — Z87891 Personal history of nicotine dependence: Secondary | ICD-10-CM | POA: Insufficient documentation

## 2013-06-11 DIAGNOSIS — I129 Hypertensive chronic kidney disease with stage 1 through stage 4 chronic kidney disease, or unspecified chronic kidney disease: Secondary | ICD-10-CM | POA: Insufficient documentation

## 2013-06-11 LAB — COMPREHENSIVE METABOLIC PANEL
ALT: 32 U/L (ref 0–35)
Calcium: 9.4 mg/dL (ref 8.4–10.5)
GFR calc Af Amer: 37 mL/min — ABNORMAL LOW (ref >90)
GFR calc non Af Amer: 32 mL/min — ABNORMAL LOW (ref >90)
Glucose, Bld: 134 mg/dL — ABNORMAL HIGH (ref 70–99)
Sodium: 138 mEq/L (ref 135–145)
Total Bilirubin: 0.1 mg/dL — ABNORMAL LOW (ref 0.3–1.2)
Total Protein: 7.9 g/dL (ref 6.0–8.3)

## 2013-06-11 LAB — CBC WITH DIFFERENTIAL/PLATELET
Eosinophils Absolute: 0 10*3/uL (ref 0.0–0.7)
Eosinophils Relative: 0 % (ref 0–5)
Lymphs Abs: 1.4 10*3/uL (ref 0.7–4.0)
MCH: 31.4 pg (ref 26.0–34.0)
MCV: 84.5 fL (ref 78.0–100.0)
Neutrophils Relative %: 70 % (ref 43–77)
Platelets: 198 10*3/uL (ref 150–400)
RDW: 13.9 % (ref 11.5–15.5)

## 2013-06-11 LAB — POCT I-STAT, CHEM 8
BUN: 51 mg/dL — ABNORMAL HIGH (ref 6–23)
Calcium, Ion: 1.26 mmol/L (ref 1.13–1.30)
Chloride: 107 mEq/L (ref 96–112)
Hemoglobin: 12.2 g/dL (ref 12.0–15.0)
Potassium: 4.4 mEq/L (ref 3.5–5.1)
Sodium: 142 mEq/L (ref 135–145)

## 2013-06-11 NOTE — ED Notes (Signed)
Family reported that pt. has been confused , progressing dementia with agitation for the past several days , pt. oriented to place and person at triage , respirations unlabored .

## 2013-06-12 ENCOUNTER — Observation Stay (HOSPITAL_COMMUNITY)
Admission: EM | Admit: 2013-06-12 | Discharge: 2013-06-14 | Disposition: A | Discharge status: Home Health | Payer: Medicare Other | Attending: Family Medicine | Admitting: Family Medicine

## 2013-06-12 ENCOUNTER — Inpatient Hospital Stay: Payer: Medicare Other | Admitting: Family Medicine

## 2013-06-12 DIAGNOSIS — I480 Paroxysmal atrial fibrillation: Secondary | ICD-10-CM

## 2013-06-12 DIAGNOSIS — R410 Disorientation, unspecified: Secondary | ICD-10-CM | POA: Diagnosis present

## 2013-06-12 DIAGNOSIS — I509 Heart failure, unspecified: Secondary | ICD-10-CM

## 2013-06-12 DIAGNOSIS — F0390 Unspecified dementia without behavioral disturbance: Secondary | ICD-10-CM | POA: Diagnosis present

## 2013-06-12 DIAGNOSIS — D649 Anemia, unspecified: Secondary | ICD-10-CM

## 2013-06-12 DIAGNOSIS — L8992 Pressure ulcer of unspecified site, stage 2: Secondary | ICD-10-CM

## 2013-06-12 DIAGNOSIS — R4182 Altered mental status, unspecified: Secondary | ICD-10-CM

## 2013-06-12 DIAGNOSIS — I10 Essential (primary) hypertension: Secondary | ICD-10-CM

## 2013-06-12 DIAGNOSIS — I441 Atrioventricular block, second degree: Secondary | ICD-10-CM

## 2013-06-12 DIAGNOSIS — N183 Chronic kidney disease, stage 3 unspecified: Secondary | ICD-10-CM

## 2013-06-12 DIAGNOSIS — F05 Delirium due to known physiological condition: Secondary | ICD-10-CM

## 2013-06-12 DIAGNOSIS — R262 Difficulty in walking, not elsewhere classified: Secondary | ICD-10-CM

## 2013-06-12 DIAGNOSIS — R6889 Other general symptoms and signs: Secondary | ICD-10-CM

## 2013-06-12 DIAGNOSIS — Z9181 History of falling: Secondary | ICD-10-CM

## 2013-06-12 DIAGNOSIS — R451 Restlessness and agitation: Secondary | ICD-10-CM | POA: Diagnosis present

## 2013-06-12 DIAGNOSIS — IMO0002 Reserved for concepts with insufficient information to code with codable children: Secondary | ICD-10-CM

## 2013-06-12 DIAGNOSIS — R634 Abnormal weight loss: Secondary | ICD-10-CM | POA: Diagnosis present

## 2013-06-12 HISTORY — DX: Unspecified dementia, unspecified severity, without behavioral disturbance, psychotic disturbance, mood disturbance, and anxiety: F03.90

## 2013-06-12 HISTORY — DX: Delirium due to known physiological condition: F05

## 2013-06-12 LAB — URINALYSIS, ROUTINE W REFLEX MICROSCOPIC
Bilirubin Urine: NEGATIVE
Hgb urine dipstick: NEGATIVE
Ketones, ur: NEGATIVE mg/dL
Leukocytes, UA: NEGATIVE
Nitrite: NEGATIVE
Protein, ur: NEGATIVE mg/dL
Urobilinogen, UA: 0.2 mg/dL (ref 0.0–1.0)

## 2013-06-12 LAB — CBC
Hemoglobin: 10.2 g/dL — ABNORMAL LOW (ref 12.0–15.0)
MCH: 30.8 pg (ref 26.0–34.0)
MCHC: 36.6 g/dL — ABNORMAL HIGH (ref 30.0–36.0)
Platelets: 173 10*3/uL (ref 150–400)
RDW: 13.8 % (ref 11.5–15.5)
WBC: 4.1 10*3/uL (ref 4.0–10.5)

## 2013-06-12 LAB — COMPREHENSIVE METABOLIC PANEL
ALT: 26 U/L (ref 0–35)
AST: 27 U/L (ref 0–37)
BUN: 52 mg/dL — ABNORMAL HIGH (ref 6–23)
CO2: 27 mEq/L (ref 19–32)
Calcium: 9.3 mg/dL (ref 8.4–10.5)
Creatinine, Ser: 1.36 mg/dL — ABNORMAL HIGH (ref 0.50–1.10)
GFR calc Af Amer: 39 mL/min — ABNORMAL LOW (ref >90)
GFR calc non Af Amer: 34 mL/min — ABNORMAL LOW (ref >90)
Glucose, Bld: 78 mg/dL (ref 70–99)
Sodium: 145 mEq/L (ref 135–145)
Total Bilirubin: 0.1 mg/dL — ABNORMAL LOW (ref 0.3–1.2)

## 2013-06-12 LAB — TROPONIN I: Troponin I: <0.3 ng/mL (ref <0.30)

## 2013-06-12 LAB — RPR: RPR Ser Ql: NONREACTIVE

## 2013-06-12 LAB — HIV ANTIBODY (ROUTINE TESTING W REFLEX): HIV: NONREACTIVE

## 2013-06-12 MED ORDER — SODIUM CHLORIDE 0.9 % IJ SOLN
3.0000 mL | Freq: Two times a day (BID) | INTRAMUSCULAR | Status: DC
  Administered 2013-06-12 – 2013-06-14 (×5): 3 mL via INTRAVENOUS

## 2013-06-12 MED ORDER — DOCUSATE SODIUM 100 MG PO CAPS: 100.0000 mg | ORAL_CAPSULE | Freq: Two times a day (BID) | ORAL | Status: DC | PRN

## 2013-06-12 MED ORDER — ASPIRIN 81 MG PO CHEW
81.0000 mg | CHEWABLE_TABLET | Freq: Every day | ORAL | Status: DC
  Administered 2013-06-12 – 2013-06-14 (×3): 81 mg via ORAL
  Filled 2013-06-12 (×3): qty 1

## 2013-06-12 MED ORDER — ENSURE PUDDING PO PUDG
1.0000 | Freq: Three times a day (TID) | ORAL | Status: DC
  Administered 2013-06-12 – 2013-06-14 (×5): 1 via ORAL

## 2013-06-12 MED ORDER — AMLODIPINE BESYLATE 5 MG PO TABS
5.0000 mg | ORAL_TABLET | Freq: Every day | ORAL | Status: DC
  Filled 2013-06-12: qty 1

## 2013-06-12 MED ORDER — SODIUM CHLORIDE 0.9 % IV SOLN: 250.0000 mL | INTRAVENOUS | Status: DC | PRN

## 2013-06-12 MED ORDER — SODIUM CHLORIDE 0.9 % IJ SOLN: 3.0000 mL | INTRAMUSCULAR | Status: DC | PRN

## 2013-06-12 MED ORDER — LORAZEPAM 2 MG/ML IJ SOLN
0.5000 mg | Freq: Once | INTRAMUSCULAR | Status: DC
  Filled 2013-06-12: qty 1

## 2013-06-12 MED ORDER — HALOPERIDOL LACTATE 5 MG/ML IJ SOLN
1.0000 mg | Freq: Once | INTRAMUSCULAR | Status: AC
  Administered 2013-06-12: 1 mg via INTRAVENOUS
  Filled 2013-06-12: qty 1

## 2013-06-12 NOTE — ED Notes (Signed)
Pt becoming agitated at this time, Dr Ranae Palms notified.

## 2013-06-12 NOTE — ED Provider Notes (Signed)
CSN: [id number]     Arrival date & time 06/11/13  2215 History   First MD Initiated Contact with Patient 06/12/13 0021     Chief Complaint  Patient presents with  . Dementia  . Altered Mental Status   (Consider location/radiation/quality/duration/timing/severity/associated sxs/prior Treatment) HPI Patient admitted to the hospital the end of last month for generalized weakness thought to be due to hyperkalemia, chronic kidney disease, deconditioning. She is brought in by her daughters for increased confusion the last few days. The patient is delusional thinking that she is pregnant and doing birth. She is oriented only to person. Daughter states that she repeat the alphabet constantly. Level V caveat applies. Family states she's had no nausea, vomiting or diarrhea. She is not complaining of any pain.  Past Medical History  Diagnosis Date  . Hypertension   . Arthritis   . Spinal stenosis   . Osteoarthritis   . Anemia   . CHF (congestive heart failure)   . Heart murmur     aortic regurg  . Osteoporosis   . Chronic kidney disease   . Generalized OA 12/16/2011    Significant. Seems to have encompass all joints. Patient is complaining of significant pain but dementia allows patient to be happy.  . Dementia   . Sundown syndrome    Past Surgical History  Procedure Laterality Date  . Nephrectomy Right   . Tonsillectomy     Family History  Problem Relation Age of Onset  . Hypertension Daughter    History  Substance Use Topics  . Smoking status: Former Games developer  . Smokeless tobacco: Never Used     Comment: quit smoking in the 70's  . Alcohol Use: No   OB History   Grav Para Term Preterm Abortions TAB SAB Ect Mult Living                 Review of Systems  Unable to perform ROS: Mental status change    Allergies  Review of patient's allergies indicates no known allergies.  Home Medications   Current Outpatient Rx  Name  Route  Sig  Dispense  Refill  . amLODipine (NORVASC) 5  MG tablet   Oral   Take 1 tablet (5 mg total) by mouth daily.   30 tablet   3   . aspirin 81 MG tablet   Oral   Take 1 tablet (81 mg total) by mouth daily.   30 tablet   3   . calcium-vitamin D (OSCAL WITH D) 500-200 MG-UNIT per tablet   Oral   Take 1 tablet by mouth 2 (two) times daily.         Marland Kitchen docusate sodium (COLACE) 100 MG capsule   Oral   Take 1 capsule (100 mg total) by mouth daily as needed for constipation.   10 capsule      . ferrous sulfate (FERROUSUL) 325 (65 FE) MG tablet   Oral   Take 325 mg by mouth 2 (two) times daily.          . Multiple Vitamins-Minerals (SENIOR MULTIVITAMIN PLUS) TABS   Oral   Take 1 tablet by mouth 1 dose over 24 hours.   100 tablet   12    BP 175/119  Pulse 93  Temp(Src) 98 F (36.7 C) (Oral)  Resp 24  SpO2 100% Physical Exam  Nursing note and vitals reviewed. Constitutional: She appears well-developed and well-nourished. No distress.  Patient is mildly agitated appearing  HENT:  Head: Normocephalic  and atraumatic.  Mouth/Throat: Oropharynx is clear and moist. No oropharyngeal exudate.  Eyes: EOM are normal. Pupils are equal, round, and reactive to light.  Neck: Normal range of motion. Neck supple.  No meningismus  Cardiovascular: Normal rate and regular rhythm.   Pulmonary/Chest: Effort normal and breath sounds normal. No respiratory distress. She has no wheezes. She has no rales.  Abdominal: Soft. Bowel sounds are normal. She exhibits no distension and no mass. There is no tenderness. There is no rebound and no guarding.  Musculoskeletal: Normal range of motion. She exhibits no edema and no tenderness.  Neurological: She is alert.  Oriented only to person. 5/5 motor in all extremities. Sensation is grossly intact.  Skin: Skin is warm and dry. No rash noted. No erythema.  Psychiatric:  Perseverating. Delusional.    ED Course  Procedures (including critical care time) Labs Review Labs Reviewed  CBC WITH  DIFFERENTIAL - Abnormal; Notable for the following:    RBC 3.73 (*)    Hemoglobin 11.7 (*)    HCT 31.5 (*)    MCHC 37.1 (*)    All other components within normal limits  COMPREHENSIVE METABOLIC PANEL - Abnormal; Notable for the following:    Glucose, Bld 134 (*)    BUN 55 (*)    Creatinine, Ser 1.44 (*)    Albumin 3.2 (*)    Total Bilirubin 0.1 (*)    GFR calc non Af Amer 32 (*)    GFR calc Af Amer 37 (*)    All other components within normal limits  POCT I-STAT, CHEM 8 - Abnormal; Notable for the following:    BUN 51 (*)    Creatinine, Ser 1.70 (*)    Glucose, Bld 140 (*)    All other components within normal limits  URINALYSIS, ROUTINE W REFLEX MICROSCOPIC  TROPONIN I   Imaging Review No results found.  EKG Interpretation   None       MDM  Discussed the family medicine residency. Will see and evaluate the patient in the emergency department.   Loren Racer, MD 06/12/13 [phone]

## 2013-06-12 NOTE — Progress Notes (Signed)
Spoke with daughter and granddaughter, who expressed their concern re:  patient's safety in the home environment.  Daughter very interested in speaking with Social Worker re:  LT placement.

## 2013-06-12 NOTE — Progress Notes (Signed)
INITIAL NUTRITION ASSESSMENT  DOCUMENTATION CODES Per approved criteria  -Not Applicable   INTERVENTION:  Ensure Pudding PO TID, each supplement provides 170 kcal and 4 grams of protein.   NUTRITION DIAGNOSIS: Unintentional weight loss related to suspected poor intake PTA as evidenced by 7% weight loss in the past 7 months.   Goal: Intake to meet >90% of estimated nutrition needs.  Monitor:  PO intake, labs, weight trend.  Reason for Assessment: MST=4  79 y.o. female  Admitting Dx: Confusion  ASSESSMENT: Patient is a 79 y.o. female presenting with worsening delusions and agitation in the setting of dementia.   Patient sleeping during RD visit. Sitter in room reports that patient ate well for breakfast and lunch today. No family in room to discuss usual intake PTA. Patient has progressively lost weight, 7% in the past 7 months. Suspect intake at home is poor at times, due to dementia.  Nutrition Focused Physical Exam:  Subcutaneous Fat:  Orbital Region: WNL Upper Arm Region: WNL Thoracic and Lumbar Region: NA  Muscle:  Temple Region: mild-moderate depletion Clavicle Bone Region: mild-moderate depletion Clavicle and Acromion Bone Region: mild-moderate depletion Scapular Bone Region: NA Dorsal Hand: WNL Patellar Region: WNL Anterior Thigh Region: WNL Posterior Calf Region: WNL  Edema: none  Height: Ht Readings from Last 1 Encounters:  06/12/13 4\' 11"  (1.499 m)    Weight: Wt Readings from Last 1 Encounters:  06/12/13 110 lb 1.6 oz (49.941 kg)    Ideal Body Weight: 44.5 kg  % Ideal Body Weight: 112%  Wt Readings from Last 10 Encounters:  06/12/13 110 lb 1.6 oz (49.941 kg)  06/01/13 110 lb 7.2 oz (50.1 kg)  04/26/13 115 lb (52.164 kg)  03/06/13 115 lb (52.164 kg)  02/26/13 118 lb (53.524 kg)  02/22/13 113 lb 1.5 oz (51.3 kg)  01/11/13 126 lb 9.6 oz (57.425 kg)  11/21/12 118 lb 9.6 oz (53.797 kg)  09/29/11 99 lb 9.6 oz (45.178 kg)  06/22/11 105 lb 8  oz (47.854 kg)    Usual Body Weight: 118 lb  % Usual Body Weight: 93%  BMI:  Body mass index is 22.23 kg/(m^2).  Estimated Nutritional Needs: Kcal: 1100-1300 Protein: 55-65 gm Fluid: 1.1-1.3 L  Skin: no wounds  Diet Order: General  EDUCATION NEEDS: -No education needs identified at this time   Intake/Output Summary (Last 24 hours) at 06/12/13 1537 Last data filed at 06/12/13 1241  Gross per 24 hour  Intake    360 ml  Output      0 ml  Net    360 ml    Last BM: 12/1   Labs:   Recent Labs Lab 06/11/13 2232 06/11/13 2242 06/12/13 0632  NA 138 142 145  K 4.4 4.4 4.4  CL 101 107 110  CO2 25  --  27  BUN 55* 51* 52*  CREATININE 1.44* 1.70* 1.36*  CALCIUM 9.4  --  9.3  GLUCOSE 134* 140* 78    CBG (last 3)  No results found for this basename: GLUCAP,  in the last 72 hours  Scheduled Meds: . aspirin  81 mg Oral Daily  . sodium chloride  3 mL Intravenous Q12H    Continuous Infusions:   Past Medical History  Diagnosis Date  . Hypertension   . Arthritis   . Spinal stenosis   . Osteoarthritis   . Anemia   . CHF (congestive heart failure)   . Heart murmur     aortic regurg  . Osteoporosis   .  Chronic kidney disease   . Generalized OA 12/16/2011    Significant. Seems to have encompass all joints. Patient is complaining of significant pain but dementia allows patient to be happy.  . Dementia   . Sundown syndrome     Past Surgical History  Procedure Laterality Date  . Nephrectomy Right   . Tonsillectomy      Joaquin Courts, RD, LDN, CNSC Pager [phone] After Hours Pager [phone]

## 2013-06-12 NOTE — ED Notes (Signed)
Per pt's family member pt has been anxious and confused for several days that has gotten worse today. Pt was admitted several weeks ago for similar symptoms that got slightly better but never completely cleared.

## 2013-06-12 NOTE — H&P (Addendum)
FMTS Attending Note  I personally saw and evaluated the patient. The plan of care was discussed with the resident team. I agree with the assessment and plan as documented by the resident.   79 year old female with past medical history of dementia, CKD stage III, and congestive heart failure presents for worsening agitation and confusion at home. Please refer to resident history of present illness. No family is present at time of my evaluation.  Vitals: Reviewed HEENT: Pupils equal in size, moist mucous members, neck was supple, no cervical lymphadenopathy General: Pleasant African American female, resting comfortably Cardiac: Regular rate and rhythm, S1 and S2 present, no murmurs Respiratory: Clear to auscultation bilaterally, normal effort Abdomen: Soft, nontender, bowel sounds present Extremities: no edema,  2+ radial and dorsalis pedis pulses Skin: No rash Neuro: Alert to self, moving all extremities  #1. Dementia with acute delirium/agigation vs progression of dementia - agree with workup as outlined by resident, no acute medical cause is identified to explain decline, family appears onboard with transfer to skilled nursing facility at time of discharge per the resident's discussion with them Other chronic medical conditions appear stable and agree with workup/treatment as outlined by the resident physician.  Donnella Sham MD

## 2013-06-12 NOTE — Progress Notes (Signed)
UR completed 

## 2013-06-12 NOTE — Evaluation (Signed)
Physical Therapy Evaluation Patient Details Name: Kathleen [name] MRN: [id number] DOB: 1922-10-05 Today's Date: 06/12/2013 Time: 9147-8295 PT Time Calculation (min): 26 min  PT Assessment / Plan / Recommendation History of Present Illness  Patient admitted to the hospital the end of last month for generalized weakness thought to be due to hyperkalemia, chronic kidney disease, deconditioning. She is brought in by her daughters for increased confusion the last few days. The patient is delusional thinking that she is pregnant and doing birth. She is oriented only to person. Daughter states that she repeat the alphabet constantly. Level V caveat applies. Family states she's had no nausea, vomiting or diarrhea. She is not complaining of any pain  Clinical Impression  Pt adm secondary to above, presents with decreased independence with functional mobility and transfers secondary to decreased strength, cognition, balance. (see PT problem list) Pt to benefit from skilled PT in acute setting to address deficits listed below, increased functional independence with mobility and decrease caregiver burden. Uncertain of PLOF secondary to patient's decreased cognition and no family present. Co tx with OT necessary for safety and due to decreased activity tolerance.     PT Assessment  Patient needs continued PT services    Follow Up Recommendations  SNF;Supervision/Assistance - 24 hour    Does the patient have the potential to tolerate intense rehabilitation      Barriers to Discharge Decreased caregiver support      Equipment Recommendations  Other (comment) (TBD at SNF)    Recommendations for Other Services     Frequency Min 2X/week    Precautions / Restrictions Precautions Precautions: Fall Restrictions Weight Bearing Restrictions: No   Pertinent Vitals/Pain No c/o pain.       Mobility  Bed Mobility Bed Mobility: Supine to Sit;Sitting - Scoot to Edge of Bed Supine to Sit: 1: +2 Total  assist Supine to Sit: Patient Percentage: 0% Sitting - Scoot to Edge of Bed: 1: +1 Total assist Details for Bed Mobility Assistance: pt required 2 person (A) to facilitate movement in bil LEs and trunk to position at EOB; use of draw pad to bring hips to EOB so feet could reach the floor; max cues for sequencing; pt demo decreased ability to follow commands   Transfers Transfers: Sit to Stand;Stand to Sit;Stand Pivot Transfers Sit to Stand: 1: +2 Total assist;From bed Sit to Stand: Patient Percentage: 10% Stand to Sit: To chair/3-in-1;1: +2 Total assist;With upper extremity assist Stand to Sit: Patient Percentage: 10% Stand Pivot Transfers: 1: +2 Total assist Stand Pivot Transfers: Patient Percentage: 10% Details for Transfer Assistance: pt with posterior lean throughout transfers; requires 2+ (A) to maintain balance; cues for hand placement and sequencing; pt was able to initiate power up to stand with max cues but continuously thrusts posteriorly  Ambulation/Gait Ambulation/Gait Assistance: Not tested (comment) Stairs: No Wheelchair Mobility Wheelchair Mobility: No         PT Diagnosis: Difficulty walking;Generalized weakness  PT Problem List: Decreased strength;Decreased activity tolerance;Decreased balance;Decreased mobility;Decreased cognition;Decreased knowledge of use of DME;Decreased safety awareness PT Treatment Interventions: Gait training;DME instruction;Functional mobility training;Therapeutic activities;Therapeutic exercise;Balance training;Patient/family education     PT Goals(Current goals can be found in the care plan section) Acute Rehab PT Goals Patient Stated Goal: none stated PT Goal Formulation: With patient Time For Goal Achievement: 06/26/13 Potential to Achieve Goals: Fair  Visit Information  Last PT Received On: 06/12/13 Assistance Needed: +2 PT/OT/SLP Co-Evaluation/Treatment: Yes PT goals addressed during session: Mobility/safety with mobility OT goals  addressed  during session: ADL's and self-care (for pt/therapist) History of Present Illness: Patient admitted to the hospital the end of last month for generalized weakness thought to be due to hyperkalemia, chronic kidney disease, deconditioning. She is brought in by her daughters for increased confusion the last few days. The patient is delusional thinking that she is pregnant and doing birth. She is oriented only to person. Daughter states that she repeat the alphabet constantly. Level V caveat applies. Family states she's had no nausea, vomiting or diarrhea. She is not complaining of any pain       Prior Functioning  Home Living Family/patient expects to be discharged to:: Skilled nursing facility Living Arrangements: Children;Other relatives Additional Comments: pt resides at [facility] Prior Function Level of Independence: Needs assistance Gait / Transfers Assistance Needed: short distance amb with RW with assist, uses w/c majority of the time ADL's / Homemaking Assistance Needed: A with bathing and dressing Comments: pt poor historian secondary to decr cognition; PLOF recorded from chart  Communication Communication: No difficulties Dominant Hand: Right    Cognition  Cognition Arousal/Alertness: Lethargic Behavior During Therapy: Flat affect Overall Cognitive Status: No family/caregiver present to determine baseline cognitive functioning (hx of cognitive impairments) Memory: Decreased short-term memory    Extremity/Trunk Assessment Upper Extremity Assessment Upper Extremity Assessment: Defer to OT evaluation Lower Extremity Assessment Lower Extremity Assessment: Generalized weakness;Difficult to assess due to impaired cognition Cervical / Trunk Assessment Cervical / Trunk Assessment: Kyphotic   Balance Balance Balance Assessed: Yes Static Sitting Balance Static Sitting - Balance Support: Bilateral upper extremity supported;Feet supported Static Sitting - Level of Assistance:  3: Mod assist Static Sitting - Comment/# of Minutes: pt continuously loosing balance posteriorly or anteriorly; requires (A) to recover to midline; Dynamic Sitting Balance Dynamic Sitting - Balance Support: Left upper extremity supported;Right upper extremity supported;Feet unsupported;During functional activity Dynamic Sitting - Level of Assistance: 2: Max assist  End of Session PT - End of Session Equipment Utilized During Treatment: Gait belt Activity Tolerance: Patient limited by lethargy Patient left: in chair;with call bell/phone within reach;with nursing/sitter in room;Other (comment) Psychiatrist in rom) Nurse Communication: Mobility status;Precautions  GP Functional Assessment Tool Used: Clinical judgement Functional Limitation: Mobility: Walking and moving around Mobility: Walking and Moving Around Current Status [phone]): At least 80 percent but less than 100 percent impaired, limited or restricted Mobility: Walking and Moving Around Goal Status [phone]): At least 20 percent but less than 40 percent impaired, limited or restricted   Donell Sievert, [place] [phone] 06/12/2013, 3:07 PM

## 2013-06-12 NOTE — Evaluation (Addendum)
Occupational Therapy Evaluation Patient Details Name: Kathleen [name] MRN: [id number] DOB: Feb 20, 1923 Today's Date: 06/12/2013 Time: 4098-1191 OT Time Calculation (min): 24 min  OT Assessment / Plan / Recommendation History of present illness Patient admitted to the hospital the end of last month for generalized weakness thought to be due to hyperkalemia, chronic kidney disease, deconditioning. She is brought in by her daughters for increased confusion the last few days. The patient is delusional thinking that she is pregnant and doing birth. She is oriented only to person. Daughter states that she repeat the alphabet constantly. Level V caveat applies. Family states she's had no nausea, vomiting or diarrhea. She is not complaining of any pain   Clinical Impression   Pt demos decline in function with ADLs and ADL mobility with decreased strength, balance and endurance. Pt would benefit from acute OT services to address impairments to increase level of function and safety. Uncertain of pt's PLOF or if family will be abe to provide current level of care. Co tx with PT necessary for pt/therapist safety as pt requires 2 person assist for mobility and total A with ADLs    OT Assessment  Patient needs continued OT Services    Follow Up Recommendations  SNF;Supervision/Assistance - 24 hour    Barriers to Discharge   pt states that she lives at home with her dtr, but uncertain if PLOF pt stated is accurate and how much assist she was getting at home for ADLs and mobility, uncertain if family can provide current level of care  Equipment Recommendations  None recommended by OT;Other (comment) (TBD)    Recommendations for Other Services    Frequency  Min 2X/week    Precautions / Restrictions Precautions Precautions: Fall Restrictions Weight Bearing Restrictions: No   Pertinent Vitals/Pain No c/o pain    ADL  Grooming: Performed;Maximal assistance Where Assessed - Grooming: Supported  sitting Upper Body Bathing: +1 Total assistance Lower Body Bathing: +1 Total assistance Upper Body Dressing: +1 Total assistance Lower Body Dressing: +1 Total assistance Toilet Transfer: +2 Total assistance;Simulated Toilet Transfer: Patient Percentage: 10% Toilet Transfer Method: Sit to stand;Stand pivot Tub/Shower Transfer: +1 Total assistance Tub/Shower Transfer Method: Not assessed Equipment Used: Gait belt Transfers/Ambulation Related to ADLs: pt required multimodal cues for correct hand placement, trunk/LE positioning. SPT total A +2, posterior leaning ADL Comments: pt demos difficulty following instructions for initiating ADLs, poor sitting balance    OT Diagnosis: Generalized weakness  OT Problem List: Decreased strength;Decreased knowledge of use of DME or AE;Decreased activity tolerance;Impaired balance (sitting and/or standing);Decreased safety awareness;Decreased knowledge of precautions OT Treatment Interventions: Self-care/ADL training;Therapeutic exercise;Patient/family education;Neuromuscular education;Balance training;Therapeutic activities;DME and/or AE instruction   OT Goals(Current goals can be found in the care plan section) Acute Rehab OT Goals Patient Stated Goal: none stated OT Goal Formulation: With patient Time For Goal Achievement: 06/19/13 Potential to Achieve Goals: Fair ADL Goals Pt Will Perform Grooming: with min assist;sitting Pt Will Perform Upper Body Bathing: with max assist;with mod assist;sitting Pt Will Perform Upper Body Dressing: with max assist;with mod assist;sitting Pt Will Transfer to Toilet: with total assist;with max assist;bedside commode Additional ADL Goal #1: pt will complete bed mobility with total A +1/max A to sit EOB in prep for ADLs and grooming Additional ADL Goal #2: Pt will sit EOB with mod - minA for dynamic balance to complete ADLs and functional tasks  Visit Information  Last OT Received On: 06/12/13 Assistance Needed:  +2 PT/OT/SLP Co-Evaluation/Treatment: Yes OT goals addressed during session:  ADL's and self-care  History of Present Illness: Patient admitted to the hospital the end of last month for generalized weakness thought to be due to hyperkalemia, chronic kidney disease, deconditioning. She is brought in by her daughters for increased confusion the last few days. The patient is delusional thinking that she is pregnant and doing birth. She is oriented only to person. Daughter states that she repeat the alphabet constantly. Level V caveat applies. Family states she's had no nausea, vomiting or diarrhea. She is not complaining of any pain       Prior Functioning     Home Living Family/patient expects to be discharged to:: Skilled nursing facility Living Arrangements: Children;Other relatives Prior Function Level of Independence: Needs assistance Gait / Transfers Assistance Needed: short distance amb with RW with assist, uses w/c majority of the time ADL's / Homemaking Assistance Needed: A with bathing and dressing Communication Communication: No difficulties Dominant Hand: Right         Vision/Perception Vision - History Baseline Vision: Wears glasses all the time (per pt) Patient Visual Report: No change from baseline Perception Perception: Within Functional Limits   Cognition  Cognition Arousal/Alertness: Lethargic Behavior During Therapy: Flat affect Overall Cognitive Status: No family/caregiver present to determine baseline cognitive functioning (has hx of cognitive  impairments) Memory: Decreased short-term memory    Extremity/Trunk Assessment Upper Extremity Assessment Upper Extremity Assessment: Generalized weakness Lower Extremity Assessment Lower Extremity Assessment: Defer to PT evaluation Cervical / Trunk Assessment Cervical / Trunk Assessment: Kyphotic     Mobility Bed Mobility Bed Mobility: Sitting - Scoot to Edge of Bed;Supine to Sit Supine to Sit: 1: +2 Total  assist Supine to Sit: Patient Percentage: 0% Sitting - Scoot to Edge of Bed: 1: +1 Total assist Details for Bed Mobility Assistance: used pad to scoo pt to EOB, posterior leaning Transfers Transfers: Sit to Stand;Stand to Sit Sit to Stand: 1: +2 Total assist;From bed Sit to Stand: Patient Percentage: 10% Stand to Sit: To chair/3-in-1 Details for Transfer Assistance: pt required multimodal cues for correct hand placement, trunk/LE positioning. SPT total A +2, posterior leaning     Exercise     Balance Balance Balance Assessed: Yes Static Sitting Balance Static Sitting - Balance Support: Bilateral upper extremity supported;Feet supported Static Sitting - Level of Assistance: 3: Mod assist Dynamic Sitting Balance Dynamic Sitting - Balance Support: Left upper extremity supported;Right upper extremity supported;Feet unsupported;During functional activity Dynamic Sitting - Level of Assistance: 2: Max assist   End of Session OT - End of Session Equipment Utilized During Treatment: Gait belt Activity Tolerance: Patient limited by lethargy Patient left: in chair;with call bell/phone within reach;with nursing/sitter in room  GO     Galen Manila 06/12/2013, 1:32 PM

## 2013-06-12 NOTE — H&P (Signed)
Family Medicine Teaching [facility] Admission History and Physical Service Pager: [phone]  Patient name: Kathleen [name] Medical record number: [id number] Date of birth: [birth year] Age: 79 y.o. Gender: female  Primary Care Provider: Denny Levy, MD Consultants: none Code Status: DNR/DNI  Chief Complaint: Confusion  Assessment and Plan: Kathleen [name] is a 79 y.o. female presenting with worsening delusions and agitation in the setting of dementia. PMH is significant for dementia, CKD stage III, and CHF.   # Agitation/Delusion - No clear preceding event or infection, but rather worsening of baseline dementia; No evidence of UTI or other infection, electrolytes and renal function stable - Support patient with re-direction and assurance at this time - Only in the event of serious agitation, use haldol 1 mg PRN (QTc < 460 ms) - No evidence of previous checks for reversible causes of dementia except TSH, so I will check HIV, RPR and B-12 - Obtain PT/OT consult for pt placement  # CKD III - creatinine on admission of 1.7 , approximately baseline - trend tomorrow if pt agreeable  # HTN  - Cont home amlodipine   # Hyperglycemia - Cont to trend  FEN/GI: Regular diet Prophylaxis: SCD's since pt already agitated, I don't want to inject her multiple times daily  Disposition: Admit for observation  History of Present Illness: Kathleen [name] is a 79 y.o. female presenting with her daughter and granddaughter from home for evaluation of behavior changes. The patient is known to have dementia at baseline and had recent issues with sun-downing. She was recently hospitalized for hyperkalemia and discharged to home. She has been having increased delusions of being pregnant and thinking that things happening in movies are occurring in her life. Her sleep has decreased due to agitation. She is eating and drinking normally per her family. She did fall two days ago and struck her forehead.  She did not lose consciousness. Her family denies any recent known cough, congestion, fever or concern for infection. No new medications. Her family's goal is to have her observed for potential reversible causes of confusion and if not, then to evaluate placement options. The patient denies any pain but does actively endorse thoughts of being pregnant.   Review Of Systems: Per HPI with the following additions: none Otherwise 12 point review of systems was performed and was unremarkable.  Patient Active Problem List   Diagnosis Date Noted  . Difficulty walking 03/06/2013  . Pressure ulcer stage II 03/06/2013  . 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 Acute pyelonephritis 02/19/2013  . Body temperature low 02/18/2013  . Irregular bowel habits 11/08/2012  . At high risk for falls 10/02/2012  . Anemia 02/24/2012  . Generalized OA 12/16/2011  . Unspecified conditions influencing health status 12/16/2011  . Dementia 10/13/2010  . Paroxysmal atrial fibrillation 10/13/2010  . Hypertension 10/13/2010  . Kidney disease, chronic, stage III (GFR 30-59 ml/min) 10/13/2010  . Osteoporosis 10/13/2010  . CHF (congestive heart failure) 10/13/2010   Past Medical History: Past Medical History  Diagnosis Date  . Hypertension   . Arthritis   . Spinal stenosis   . Osteoarthritis   . Anemia   . CHF (congestive heart failure)   . Heart murmur     aortic regurg  . Osteoporosis   . Chronic kidney disease   . Generalized OA 12/16/2011    Significant. Seems to have encompass all joints. Patient is complaining of significant pain but dementia allows patient to be happy.  Marland Kitchen  Dementia   . Sundown syndrome    Past Surgical History: Past Surgical History  Procedure Laterality Date  . Nephrectomy Right   . Tonsillectomy     Social History: History  Substance Use Topics  . Smoking status: Former Games developer  . Smokeless tobacco: Never Used     Comment: quit smoking in the  70's  . Alcohol Use: No   Additional social history: Daughter [name] works at [employer]  Please also refer to relevant sections of EMR.  Family History: Family History  Problem Relation Age of Onset  . Hypertension Daughter    Allergies and Medications: No Known Allergies No current facility-administered medications on file prior to encounter.   Current Outpatient Prescriptions on File Prior to Encounter  Medication Sig Dispense Refill  . amLODipine (NORVASC) 5 MG tablet Take 1 tablet (5 mg total) by mouth daily.  30 tablet  3  . aspirin 81 MG tablet Take 1 tablet (81 mg total) by mouth daily.  30 tablet  3  . calcium-vitamin D (OSCAL WITH D) 500-200 MG-UNIT per tablet Take 1 tablet by mouth daily.       . ferrous sulfate (FERROUSUL) 325 (65 FE) MG tablet Take 325 mg by mouth daily.       . Multiple Vitamins-Minerals (SENIOR MULTIVITAMIN PLUS) TABS Take 1 tablet by mouth 1 dose over 24 hours.  100 tablet  12    Objective: BP 153/60  Pulse 68  Temp(Src) 98 F (36.7 C) (Oral)  Resp 16  SpO2 100% Exam: General: elderly female, lying in bed, non distressed HEENT: NCAT, PERRLA, ecchymosis on left forehead, no hemotympanum, no retinal hemorrhages Cardiovascular: NSR, 2/6 SM Respiratory: normal WOB, CTA-B Abdomen: soft, NDNT Extremities: non tender, non edematous Skin: no ulcerations Neuro: awake alert, oriented to person only; active delusions of pregnancy   Labs and Imaging: CBC BMET   Recent Labs Lab 06/11/13 2232 06/11/13 2242  WBC 5.2  --   HGB 11.7* 12.2  HCT 31.5* 36.0  PLT 198  --     Recent Labs Lab 06/11/13 2232 06/11/13 2242  NA 138 142  K 4.4 4.4  CL 101 107  CO2 25  --   BUN 55* 51*  CREATININE 1.44* 1.70*  GLUCOSE 134* 140*  CALCIUM 9.4  --       Date: 06/12/2013  Rate: 78  Rhythm: normal sinus rhythm  QRS Axis: normal  Intervals: PR prolonged  ST/T Wave abnormalities: nonspecific T wave changes  Conduction  Disutrbances:first-degree A-V block   Narrative Interpretation:   Old EKG Reviewed: unchanged     Garnetta Buddy, MD 06/12/2013, 2:07 AM PGY-3, [place] [facility] FPTS Intern pager: [phone], text pages welcome

## 2013-06-13 DIAGNOSIS — F4489 Other dissociative and conversion disorders: Secondary | ICD-10-CM

## 2013-06-13 DIAGNOSIS — R6889 Other general symptoms and signs: Secondary | ICD-10-CM

## 2013-06-13 DIAGNOSIS — R262 Difficulty in walking, not elsewhere classified: Secondary | ICD-10-CM

## 2013-06-13 DIAGNOSIS — D649 Anemia, unspecified: Secondary | ICD-10-CM

## 2013-06-13 DIAGNOSIS — I4891 Unspecified atrial fibrillation: Secondary | ICD-10-CM

## 2013-06-13 DIAGNOSIS — Z9181 History of falling: Secondary | ICD-10-CM

## 2013-06-13 DIAGNOSIS — R410 Disorientation, unspecified: Secondary | ICD-10-CM | POA: Diagnosis present

## 2013-06-13 DIAGNOSIS — R634 Abnormal weight loss: Secondary | ICD-10-CM | POA: Diagnosis present

## 2013-06-13 DIAGNOSIS — R404 Transient alteration of awareness: Secondary | ICD-10-CM

## 2013-06-13 MED ORDER — QUETIAPINE FUMARATE 25 MG PO TABS
25.0000 mg | ORAL_TABLET | Freq: Every day | ORAL | Status: DC
  Filled 2013-06-13 (×2): qty 1

## 2013-06-13 NOTE — Progress Notes (Signed)
FMTS Attending Daily Note:  [name] MD  [phone] pager  Family Practice pager:  [phone] I have seen and examined this patient and have reviewed their chart. I have discussed this patient with the resident. I agree with the resident's findings, assessment and care plan.  Additionally:  Will touch base with daughter to see about placement vs home with home health.    Tobey Grim, MD 06/13/2013 1:18 PM

## 2013-06-13 NOTE — Progress Notes (Signed)
Family Medicine Teaching [name] Progress Note Intern Pager: [phone]  Patient name: Kathleen [name] Medical record number: [id number] Date of birth: 11-Mar-1923 Age: 79 y.o. Gender: female  Primary Care Provider: Denny Levy, MD Consultants: none Code Status: DNR/DNI  Pt Overview and Major Events to Date:  12/3 - Admitted with delirium  Assessment and Plan: Kathleen [name] is a 79 y.o. female presenting with worsening delusions and agitation in the setting of dementia. PMH is significant for dementia, CKD stage III, and CHF.   # Agitation/Delusion - No clear preceding event or infection, but rather worsening of baseline dementia; No evidence of UTI or other infection, electrolytes and renal function stable  - Support patient with re-direction and assurance at this time  - Only in the event of serious agitation, use haldol 1 mg PRN (QTc < 460 ms)  - Negative reversible cause labs including TSH, HIV, RPR and B-12  - PT/OT consulted for pt placement   # CKD III - creatinine on admission of 1.7 , approximately baseline  - trend tomorrow if pt agreeable   # HTN  - Cont home amlodipine   # Hyperglycemia  - Cont to trend   FEN/GI: Regular diet  Prophylaxis: SCD's since pt already agitated, I don't want to inject her multiple times [name]   Disposition: pending clinical improvement and nursing home placement  Subjective: Feeling well this morning, calm, understands need for more help to ensure her safety  Objective: Temp:  [93.3 F (34.1 C)-97 F (36.1 C)] 93.9 F (34.4 C) (12/04 0506) Pulse Rate:  [52-65] 54 (12/04 0506) Resp:  [18] 18 (12/04 0506) BP: (99-148)/(52-71) 148/71 mmHg (12/04 0506) SpO2:  [95 %-100 %] 95 % (12/04 0506) Weight:  [110 lb 14.4 oz (50.304 kg)] 110 lb 14.4 oz (50.304 kg) (12/03 2206) Physical Exam: General: elderly female, lying in bed, non distressed  HEENT: NCAT, ecchymosis on left forehead, no hemotympanum, no retinal hemorrhages   Cardiovascular: NSR, 2/6 SM  Respiratory: normal WOB, CTA-B  Abdomen: soft, NDNT  Extremities: non tender, non edematous  Skin: no ulcerations  Neuro: awake alert, oriented to person only; active delusions of pregnancy   Laboratory:  Recent Labs Lab 06/11/13 2232 06/11/13 2242 06/12/13 0632  WBC 5.2  --  4.1  HGB 11.7* 12.2 10.2*  HCT 31.5* 36.0 27.9*  PLT 198  --  173    Recent Labs Lab 06/11/13 2232 06/11/13 2242 06/12/13 0632  NA 138 142 145  K 4.4 4.4 4.4  CL 101 107 110  CO2 25  --  27  BUN 55* 51* 52*  CREATININE 1.44* 1.70* 1.36*  CALCIUM 9.4  --  9.3  PROT 7.9  --  6.9  BILITOT 0.1*  --  0.1*  ALKPHOS 100  --  83  ALT 32  --  26  AST 34  --  27  GLUCOSE 134* 140* 78    Imaging/Diagnostic Tests: EKG Rate: 78  Rhythm: normal sinus rhythm  QRS Axis: normal  Intervals: PR prolonged  ST/T Wave abnormalities: nonspecific T wave changes  Conduction Disutrbances:first-degree A-V block  Narrative Interpretation:  Old EKG Reviewed: unchanged   Beverely Low, MD 06/13/2013, 7:22 AM PGY-1, [facility] Family [facility] FPTS Intern pager: [phone], text pages welcome

## 2013-06-14 DIAGNOSIS — L8990 Pressure ulcer of unspecified site, unspecified stage: Secondary | ICD-10-CM

## 2013-06-14 DIAGNOSIS — L8992 Pressure ulcer of unspecified site, stage 2: Secondary | ICD-10-CM

## 2013-06-14 DIAGNOSIS — I441 Atrioventricular block, second degree: Secondary | ICD-10-CM

## 2013-06-14 MED ORDER — QUETIAPINE FUMARATE 25 MG PO TABS: 25.0000 mg | ORAL_TABLET | Freq: Every day | ORAL | Status: DC

## 2013-06-14 NOTE — Progress Notes (Signed)
Physical Therapy Treatment Patient Details Name: Kathleen [name] MRN: [id number] DOB: September 10, 1922 Today's Date: 06/14/2013 Time: 1400-1420 PT Time Calculation (min): 20 min  PT Assessment / Plan / Recommendation  History of Present Illness     PT Comments   Spoke with daughter via telephone today regarding D/C disposition for pt. Reported to daughter that pt is now requiring 2+ (A) for SPT. Daughter verbalized understanding and reported that pt will have adequate (A) at home. Daughter reported that pt has BSC, RW and shower chair. No further DME needs at this time. Will recommend HHPT to assess safety and caregiver knowledge of proper technique for transfers. Pt planned to D/C this afternoon with her family. Pt was able to follow one step commands more consistently today with max cues. Will cont to follow per POC until acute D/C.   Follow Up Recommendations  Home health PT;Supervision/Assistance - 24 hour     Does the patient have the potential to tolerate intense rehabilitation     Barriers to Discharge        Equipment Recommendations  Other (comment) (No DME needs; spoke with daughter)    Recommendations for Other Services    Frequency Min 2X/week   Progress towards PT Goals Progress towards PT goals: Progressing toward goals  Plan Discharge plan needs to be updated    Precautions / Restrictions Precautions Precautions: Fall Restrictions Weight Bearing Restrictions: No   Pertinent Vitals/Pain No c/o pain. See VSS    Mobility  Bed Mobility Bed Mobility: Supine to Sit;Sitting - Scoot to [place] of Bed;Sit to Supine Supine to Sit: 3: Mod assist;HOB elevated Sitting - Scoot to Edge of Bed: 3: Mod assist Sit to Supine: 2: Max assist;HOB flat Details for Bed Mobility Assistance: pt was able to follow one step commands with increased time and tactile cues to initiate movement; pt required mod (A) to come to sitting position at EOB; required max (A) to acheive supine position due  to fatigue and overall decreased strength Transfers Transfers: Not assessed Ambulation/Gait Ambulation/Gait Assistance: Not tested (comment) Stairs: No Wheelchair Mobility Wheelchair Mobility: No         PT Diagnosis:    PT Problem List:   PT Treatment Interventions:     PT Goals (current goals can now be found in the care plan section) Acute Rehab PT Goals Patient Stated Goal: none stated PT Goal Formulation: With patient/family Time For Goal Achievement: 06/26/13 Potential to Achieve Goals: Fair  Visit Information  Last PT Received On: 06/14/13 Assistance Needed: +2    Subjective Data  Subjective: pt lying supine; arousable with multimodal cues  Patient Stated Goal: none stated   Cognition  Cognition Arousal/Alertness: Lethargic Behavior During Therapy: Restless (when aroused ) Overall Cognitive Status: History of cognitive impairments - at baseline    Balance  Balance Balance Assessed: Yes Static Sitting Balance Static Sitting - Balance Support: Bilateral upper extremity supported;Feet unsupported Static Sitting - Level of Assistance: 5: Stand by assistance Static Sitting - Comment/# of Minutes: pt was able to sit up on EOB without physical (A); cues for upright posture; pt restless and continuously reaching for sheets to make bed. tolerated sitting EOB ~6 min  End of Session PT - End of Session Equipment Utilized During Treatment: Gait belt Activity Tolerance: Patient tolerated treatment well Patient left: in bed;with bed alarm set Nurse Communication: Mobility status   GP Functional Assessment Tool Used: Clinical judgement Functional Limitation: Mobility: Walking and moving around Mobility: Walking and Moving Around Current  Status [phone]): At least 80 percent but less than 100 percent impaired, limited or restricted Mobility: Walking and Moving Around Goal Status [phone]): At least 20 percent but less than 40 percent impaired, limited or restricted Mobility:  Walking and Moving Around Discharge Status [phone]): At least 80 percent but less than 100 percent impaired, limited or restricted   Donell Sievert, [place] [phone] 06/14/2013, 3:04 PM

## 2013-06-14 NOTE — Progress Notes (Signed)
FMTS Attending Daily Note:  [name] MD  [phone] pager  Family Practice pager:  [phone] I have discussed this patient with the resident Dr. Richarda Blade.  I agree with their findings, assessment, and care plan.  Persistent hypothermia noted.  No other signs or symptoms of infection/sepsis.  Plan is for DC home with home health today.

## 2013-06-14 NOTE — Care Management Note (Signed)
   CARE MANAGEMENT NOTE 06/14/2013  Patient:  Kathleen [name], Kathleen [name]   Account Number:  [id number]  Date Initiated:  06/14/2013  Documentation initiated by:  Iyahna Obriant  Subjective/Objective Assessment:   Orders for [facility] in pt who will return to home with family.     Action/Plan:   Spoke with pt daughter, Abundio Miu re HH needs, she selected [facility] for HHPT, OT RN and aide.   Anticipated DC Date:  06/14/2013   Anticipated DC Plan:  HOME W HOME HEALTH SERVICES         Choice offered to / List presented to:          [facility] arranged  HH-1 RN  HH-2 PT  HH-3 OT  HH-4 NURSE'S AIDE      HH agency  [facility]   Status of service:  Completed, signed off Medicare Important Message given?   (If response is "NO", the following Medicare IM given date fields will be blank) Date Medicare IM given:   Date Additional Medicare IM given:    Discharge Disposition:  HOME W HOME HEALTH SERVICES  Per UR Regulation:    If discussed at Long Length of Stay Meetings, dates discussed:    Comments:  06/14/2013 Spoke with pt daughter, Abundio Miu re [facility] preferrance and needs. She selected [facility] for [facility] services and we discussed personal care services, an application for the daughter to take to pt MD to be completed will be left in pt bedside table top drawer for the daughter to take home. Also discussed pt with [facility] and [facility] will make arrangements for that service to contact daughter. Johny Shock RN MPH case manager, [phone]

## 2013-06-14 NOTE — Progress Notes (Signed)
Family Medicine Teaching Service Daily Progress Note Intern Pager: [phone]  Patient name: Kathleen [name] Medical record number: [id number] Date of birth: July 18, 1922 Age: 79 y.o. Gender: female  Primary Care Provider: Denny Levy, MD Consultants: none Code Status: DNR/DNI  Pt Overview and Major Events to Date:  12/3 - Admitted with delirium  Assessment and Plan: Kathleen [name] is a 79 y.o. female presenting with worsening delusions and agitation in the setting of dementia. PMH is significant for dementia, CKD stage III, and CHF.   # Agitation/Delusion - No clear preceding event or infection, likely represents worsening of baseline dementia; No evidence of UTI or other infection, electrolytes and renal function stable  - Support patient with re-direction and assurance at this time  - Only in the event of serious agitation, use haldol 1 mg PRN (QTc < 460 ms)  - Negative reversible cause labs including TSH, HIV, RPR and B-12  - Family wants to take her home with [facility], ready to go when this is set up - started seroquel qhs last night, monitor for improvement in mental status  # CKD III - creatinine on admission of 1.7 , approximately baseline  - trend tomorrow if pt agreeable   # HTN  - Cont home amlodipine   # Hyperglycemia  - Cont to trend   FEN/GI: Regular diet  Prophylaxis: SCD's since pt already agitated, I don't want to inject her multiple times daily   Disposition: home later today when [facility] set-up  Subjective: No complaints this morning, says she would like to go home, feels hot and wants to take her gown and blankets off  Objective: Temp:  [94.4 F (34.7 C)-94.6 F (34.8 C)] 94.5 F (34.7 C) (12/05 0610) Pulse Rate:  [54-59] 59 (12/05 0610) Resp:  [16] 16 (12/05 0610) BP: (118-199)/(63-74) 142/74 mmHg (12/05 0618) SpO2:  [100 %] 100 % (12/05 0610) Weight:  [114 lb (51.71 kg)] 114 lb (51.71 kg) (12/04 2109) Physical Exam: General: elderly female, lying in bed,  fidgety and pushing covers off HEENT: NCAT, ecchymosis on left forehead, does not open eyes  Cardiovascular: NSR, 2/6 SM  Respiratory: normal WOB, CTA-B  Abdomen: soft, NDNT  Extremities: non tender, non edematous  Skin: no ulcerations  Neuro: awake alert, oriented to person only; thinks she is at [employer]" and doesn't know the year   Laboratory:  Recent Labs Lab 06/11/13 2232 06/11/13 2242 06/12/13 0632  WBC 5.2  --  4.1  HGB 11.7* 12.2 10.2*  HCT 31.5* 36.0 27.9*  PLT 198  --  173    Recent Labs Lab 06/11/13 2232 06/11/13 2242 06/12/13 0632  NA 138 142 145  K 4.4 4.4 4.4  CL 101 107 110  CO2 25  --  27  BUN 55* 51* 52*  CREATININE 1.44* 1.70* 1.36*  CALCIUM 9.4  --  9.3  PROT 7.9  --  6.9  BILITOT 0.1*  --  0.1*  ALKPHOS 100  --  83  ALT 32  --  26  AST 34  --  27  GLUCOSE 134* 140* 78    Imaging/Diagnostic Tests: EKG Rate: 78  Rhythm: normal sinus rhythm  QRS Axis: normal  Intervals: PR prolonged  ST/T Wave abnormalities: nonspecific T wave changes  Conduction Disutrbances: first-degree A-V block  Old EKG Reviewed: unchanged  Beverely Low, MD 06/14/2013, 9:11 AM PGY-1, [place] [facility] FPTS Intern pager: [phone], text pages welcome

## 2013-06-17 NOTE — Discharge Summary (Signed)
Family Medicine Teaching [facility] Discharge Summary  Patient name: Kathleen [name] Medical record number: [id number] Date of birth: [birth year] Age: 79 y.o. Gender: female Date of Admission: 06/12/2013  Date of Discharge: 06/14/13 Admitting Physician: Uvaldo Rising, MD  Primary Care Provider: Denny Levy, MD Consultants: none  Indication for Hospitalization: delirium  Discharge Diagnoses/Problem List:  Patient Active Problem List   Diagnosis Date Noted  . Delirium 06/13/2013  . Unintentional weight loss 06/13/2013  . Agitation 06/12/2013  . Difficulty walking 03/06/2013  . Pressure ulcer stage II 03/06/2013  . 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 Acute pyelonephritis 02/19/2013  . Body temperature low 02/18/2013  . Irregular bowel habits 11/08/2012  . At high risk for falls 10/02/2012  . Anemia 02/24/2012  . Generalized OA 12/16/2011  . Unspecified conditions influencing health status 12/16/2011  . Dementia 10/13/2010  . Paroxysmal atrial fibrillation 10/13/2010  . Hypertension 10/13/2010  . Kidney disease, chronic, stage III (GFR 30-59 ml/min) 10/13/2010  . Osteoporosis 10/13/2010  . CHF (congestive heart failure) 10/13/2010    Disposition: home  Discharge Condition: stable  Discharge Exam:  General: elderly female, lying in bed, fidgety and pushing covers off  HEENT: NCAT, ecchymosis on left forehead, does not open eyes  Cardiovascular: NSR, 2/6 SM  Respiratory: normal WOB, CTA-B  Abdomen: soft, NDNT  Extremities: non tender, non edematous  Skin: no ulcerations  Neuro: awake alert, oriented to person only; thinks she is at [employer]" and doesn't know the year   Brief Hospital Course: Pt was admitted for altered mental status. On further evaluation she appeared to have a worsening of her baseline dementia without any evidence of acute delirium or reversible causes. Her level of orientation continued to wax and  wane without a clear trigger but she was not agitated and appeared to be at her baseline. Placement options were explored but ultimately her family decided to take her back home with additional assistance from home health. Seroquel was started on 12/4 to see if it would help with some agitation at home since this seems to be somewhat seasonal.  Issues for Follow Up: # Temperature: She was persistently hypothermic around 95 degrees but without any other signs of infection and she appeared comfortable.  # Seroquel: Follow up improvement on this.  Significant Procedures: none  Significant Labs and Imaging:   Recent Labs Lab 06/11/13 2232 06/11/13 2242 06/12/13 0632  WBC 5.2  --  4.1  HGB 11.7* 12.2 10.2*  HCT 31.5* 36.0 27.9*  PLT 198  --  173    Recent Labs Lab 06/11/13 2232 06/11/13 2242 06/12/13 0632  NA 138 142 145  K 4.4 4.4 4.4  CL 101 107 110  CO2 25  --  27  GLUCOSE 134* 140* 78  BUN 55* 51* 52*  CREATININE 1.44* 1.70* 1.36*  CALCIUM 9.4  --  9.3  ALKPHOS 100  --  83  AST 34  --  27  ALT 32  --  26  ALBUMIN 3.2*  --  2.8*   EKG  Rate: 78  Rhythm: normal sinus rhythm  QRS Axis: normal  Intervals: PR prolonged  ST/T Wave abnormalities: nonspecific T wave changes  Conduction Disutrbances: first-degree A-V block  Old EKG Reviewed: unchanged   Results/Tests Pending at Time of Discharge: none  Discharge Medications:    Medication List         amLODipine 5 MG tablet  Commonly known as:  NORVASC  Take 1 tablet (5 mg total) by mouth daily.     aspirin 81 MG tablet  Take 1 tablet (81 mg total) by mouth daily.     calcium-vitamin D 500-200 MG-UNIT per tablet  Commonly known as:  OSCAL WITH D  Take 1 tablet by mouth daily.     docusate sodium 100 MG capsule  Commonly known as:  COLACE  Take 100 mg by mouth 2 (two) times daily as needed for mild constipation.     FERROUSUL 325 (65 FE) MG tablet  Generic drug:  ferrous sulfate  Take 325 mg by mouth  daily.     QUEtiapine 25 MG tablet  Commonly known as:  SEROQUEL  Take 1 tablet (25 mg total) by mouth at bedtime.     SENIOR MULTIVITAMIN PLUS Tabs  Take 1 tablet by mouth 1 dose over 24 hours.        Discharge Instructions: Please refer to Patient Instructions section of EMR for full details.  Patient was counseled important signs and symptoms that should prompt return to medical care, changes in medications, dietary instructions, activity restrictions, and follow up appointments.   Follow-Up Appointments:     Follow-up Information   Follow up with Denny Levy, MD On 06/19/2013. (11:30am)    Specialties:  Family Medicine, Sports Medicine   Contact information:   [address] [place] [address] [phone]       Beverely Low, MD 06/17/2013, 10:16 PM PGY-1, [facility] Family [facility]

## 2013-06-18 NOTE — Discharge Summary (Signed)
Family Medicine Teaching Service  Discharge Note : Attending Jeff Athea Haley MD Pager [phone] Inpatient Team Pager:  [phone]  I have reviewed this patient and the patient's chart and have discussed discharge planning with the resident at the time of discharge. I agree with the discharge plan as above.    

## 2013-06-19 ENCOUNTER — Ambulatory Visit (INDEPENDENT_AMBULATORY_CARE_PROVIDER_SITE_OTHER): Payer: Medicare Other | Admitting: Family Medicine

## 2013-06-19 VITALS — BP 138/46 | HR 62 | Temp 98.2°F | Ht 59.0 in

## 2013-06-19 DIAGNOSIS — F0390 Unspecified dementia without behavioral disturbance: Secondary | ICD-10-CM

## 2013-06-25 ENCOUNTER — Telehealth: Payer: Self-pay | Admitting: Clinical

## 2013-06-25 NOTE — Progress Notes (Signed)
   Subjective:    Patient ID: Kathleen [name], female    DOB: 1923-06-24, 80 y.o.   MRN: [id number]  HPI Followup recent hospitalization. She's here with her granddaughter today. They have questions about whether or not he should try to place her again or get increase in home health. She's having a lot of wandering and agitation particularly at night. Sometimes during the day she gets quite confused.   Review of Systems  No shortness of breath, appetite is about the same, the food intake about the same.    Objective:   Physical Exam  Vital signs reviewed GENERAL: Well-developed elderly female sitting in a wheelchair. Neatly dressed. Alert. PSYCHIATRIC: Answers questions with some confabulation. CV: Regular rate and rhythm without murmur gallop or rub      Assessment & Plan:  #1. Dementia: Long discussion with the granddaughter. We have started her on Seroquel in the hospital I think I would try that on a regular basis at night for a few weeks to see how she does. I will help him in any way I can to get either some more help at home or help him find placement. Her Alzheimer's is fairly advanced and she's having a lot of confusion and agitation at night.

## 2013-06-25 NOTE — Telephone Encounter (Signed)
Clinical Child psychotherapist (CSW) informed by pt daughter Ms. Vinetta Bergamo. that pt has yet to receive home health/[facility] services. CSW spoke with [facility] RN Ellyn Hack who informed CSW that someone would be contacting pt daughter within the next day or so. CSW also contacted [facility] Rep. Jodene Nam who informed CSW that the hospital RNCM unfortunately did not contact [facility] with the referral that was placed in the hospital. CSW was informed that services (RN/PT/OT/RN aide) would be started as early as tomorrow. CSW also completed a PCS form and faxed it to [facility] to see if pt could qualify for assistance at home. Pt daughter made aware of the above.  Theresia Bough, MSW, LCSW [phone]

## 2013-07-09 ENCOUNTER — Other Ambulatory Visit: Payer: Self-pay | Admitting: Family Medicine

## 2013-07-09 NOTE — Telephone Encounter (Signed)
Will fwd to PCP for review.  Chosen Garron L, CMA  

## 2013-07-10 NOTE — Progress Notes (Signed)
OT Evaluation Addendum Late entry G Codes  06-23-13 1312  OT Time Calculation  OT Start Time 1009  OT Stop Time 1033  OT Time Calculation (min) 24 min  OT G-codes **NOT FOR INPATIENT CLASS**  Functional Assessment Tool Used clinical judgement  Functional Limitation Self care  Self Care Current Status (G2952) CM  Self Care Goal Status (W4132) CM  OT General Charges  $OT Visit 1 Procedure  OT Evaluation  $Initial OT Evaluation Tier I 1 Procedure  OT Treatments  $Therapeutic Activity 8-22 mins

## 2013-07-18 ENCOUNTER — Telehealth: Payer: Self-pay | Admitting: Family Medicine

## 2013-07-18 NOTE — Telephone Encounter (Signed)
Kathleen [name] called from [facility] to let Dr. Jennette Kettle know that before she arrived today Kathleen [name] had a fall to her knees. She said that it was iced and that they did the exercises and had no problems doing this and Kathleen [name] did not have and complaints about pain. jw

## 2013-07-30 ENCOUNTER — Telehealth: Payer: Self-pay | Admitting: Family Medicine

## 2013-07-30 NOTE — Telephone Encounter (Signed)
Would like orders to do manual therapy on her IT band. Pt is complaining of pain in her lefg Please advise

## 2013-08-07 ENCOUNTER — Telehealth: Payer: Self-pay | Admitting: Family Medicine

## 2013-08-07 ENCOUNTER — Ambulatory Visit (INDEPENDENT_AMBULATORY_CARE_PROVIDER_SITE_OTHER): Payer: Medicare Other | Admitting: Family Medicine

## 2013-08-07 ENCOUNTER — Ambulatory Visit (HOSPITAL_COMMUNITY)
Admission: RE | Admit: 2013-08-07 | Discharge: 2013-08-07 | Disposition: A | Discharge status: Home / Self Care | Payer: Medicare Other | Source: Ambulatory Visit | Attending: Family Medicine | Admitting: Family Medicine

## 2013-08-07 ENCOUNTER — Encounter: Payer: Self-pay | Admitting: Family Medicine

## 2013-08-07 VITALS — BP 128/71 | HR 53 | Temp 94.4°F | Ht 59.0 in | Wt 116.0 lb

## 2013-08-07 DIAGNOSIS — M25552 Pain in left hip: Principal | ICD-10-CM

## 2013-08-07 DIAGNOSIS — M25551 Pain in right hip: Secondary | ICD-10-CM

## 2013-08-07 DIAGNOSIS — M169 Osteoarthritis of hip, unspecified: Secondary | ICD-10-CM | POA: Insufficient documentation

## 2013-08-07 DIAGNOSIS — M549 Dorsalgia, unspecified: Secondary | ICD-10-CM

## 2013-08-07 DIAGNOSIS — R079 Chest pain, unspecified: Secondary | ICD-10-CM | POA: Insufficient documentation

## 2013-08-07 DIAGNOSIS — M1610 Unilateral primary osteoarthritis, unspecified hip: Secondary | ICD-10-CM | POA: Insufficient documentation

## 2013-08-07 DIAGNOSIS — M25559 Pain in unspecified hip: Secondary | ICD-10-CM

## 2013-08-07 NOTE — Patient Instructions (Signed)
Go to the front of the hospital to get x-rays of hips and left side of ribs. We will talk about steps after this if we find a fracture. Otherwise, I would ice the areas that seem to bother her. She can also take 325 to 650mg  of tylenol every 6-8 hours as needed for pain.   Thanks and we will be in touch, Tana Conch

## 2013-08-07 NOTE — Telephone Encounter (Signed)
Left voicemail on work phone. Other 2 numbers did not work (one said not accepting outside calls, other did not ring at all). Would like to discuss results with [name].

## 2013-08-08 NOTE — Progress Notes (Signed)
Tana Conch, MD Phone: [phone]  Subjective:  Chief complaint-noted  Patient presents with granddaughter after a fall 2 days ago. Patient was standing up at her dresser and had wheelchair behind her. Family briefly walked out and came back in to find patient on the ground and realized that patient had tried to sit down without locking wheelchair as wheelchair had slid back. Since that time, patient has called out in pain with transfers and standing at times. It seems that pain is in right hip but patient poor historian. Level 5 caveat applies due to dementia.  ROS- denies current pain. Family denies any confusion or patient "feeling poorly" in any way. No increase in falls but just complained of more pain after this fall. Patient is a known fall risk.   Past Medical History Patient Active Problem List   Diagnosis Date Noted  . Hip pain, bilateral 08/07/2013  . Delirium 06/13/2013  . Unintentional weight loss 06/13/2013  . Agitation 06/12/2013  . Difficulty walking 03/06/2013  . Pressure ulcer stage II 03/06/2013  . 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 Body temperature low 02/18/2013  . Irregular bowel habits 11/08/2012  . At high risk for falls 10/02/2012  . Anemia 02/24/2012  . Generalized OA 12/16/2011  . Unspecified conditions influencing health status 12/16/2011  . Dementia 10/13/2010  . Paroxysmal atrial fibrillation 10/13/2010  . Hypertension 10/13/2010  . Kidney disease, chronic, stage III (GFR 30-59 ml/min) 10/13/2010  . Osteoporosis 10/13/2010  . CHF (congestive heart failure) 10/13/2010    Medications- reviewed and updated Current Outpatient Prescriptions  Medication Sig Dispense Refill  . amLODipine (NORVASC) 5 MG tablet TAKE 1 TABLET BY MOUTH DAILY  30 tablet  3  . aspirin 81 MG tablet Take 1 tablet (81 mg total) by mouth daily.  30 tablet  3  . calcium-vitamin D (OSCAL WITH D) 500-200 MG-UNIT per tablet Take 1 tablet by  mouth daily.       Marland Kitchen docusate sodium (COLACE) 100 MG capsule Take 100 mg by mouth 2 (two) times daily as needed for mild constipation.      . ferrous sulfate (FERROUSUL) 325 (65 FE) MG tablet Take 325 mg by mouth daily.       . Multiple Vitamins-Minerals (SENIOR MULTIVITAMIN PLUS) TABS Take 1 tablet by mouth 1 dose over 24 hours.  100 tablet  12  . QUEtiapine (SEROQUEL) 25 MG tablet Take 1 tablet (25 mg total) by mouth at bedtime.  30 tablet  0   No current facility-administered medications for this visit.    Objective: BP 128/71  Pulse 53  Temp(Src) 94.4 F (34.7 C) (Oral)  Ht 4\' 11"  (1.499 m)  Wt 116 lb (52.617 kg)  BMI 23.42 kg/m2 Gen: NAD, resting comfortably in chair CV: RRR no murmurs rubs or gallops Lungs: CTAB no crackles, wheeze, rhonchi Neuro: grossly normal, moves all extremities, sits in wheelchair Palpated throughout pelvis and head of femur with only mild intermittent spots of pain that are not always reproducible Skin: slight brusing on left mid back over ribs (slight tender as well)  MSK: 4/5 strength throughout lower extremities, family did not feel safe getting patient onto table. Pain only withwith active flexion of lower leg on left and points to hip area. No pain with ER or IR of hip from seated position.   Dg Ribs Unilateral Left  08/07/2013   CLINICAL DATA:  Left-sided rib pain  EXAM: LEFT RIBS - 2 VIEW  COMPARISON:  None.  FINDINGS: No fracture or other bone lesions are seen involving the ribs.  IMPRESSION: Negative.   Electronically Signed   By: Elige KoHetal  Patel   On: 08/07/2013 16:16   Dg Hip Bilateral W/pelvis  08/07/2013   CLINICAL DATA:  Bilateral hip pain  EXAM: BILATERAL HIP WITH PELVIS - 4+ VIEW  COMPARISON:  None.  FINDINGS: There is no fracture or dislocation. There mild osteoarthritic osteoarthritis changes of the left hip. There is patchy sclerosis and lucency within the right femoral head as can be seen with avascular necrosis. There is no articular  surface irregularity. The SI joints are unremarkable.  There is a calcified mass in the right side of the pelvis likely representing a calcified uterine leiomyoma.  IMPRESSION: 1. Mild osteoarthritis of the left hip.  2. Patchy sclerosis and lucency within the right femoral head as can be seen with avascular necrosis. There is no articular surface irregularity.   Electronically Signed   By: Elige KoHetal  Patel   On: 08/07/2013 16:15   Assessment/Plan:  Hip pain, bilateral Patient poor historian in ability to present pain. Family seems to think patient has focused on hip pain on the right. Obtained bilateral hip film with pelvis as a result. Also with pain over bruised spot on left ribs so obtained film in this area.   No acute fracture noted on either film. Reviewed with patient's daughter Britta MccreedyBarbara who works in our office. There was incidentally AVN noted of the right femoral head. It is possible this fall aggravated this pain. Discussed with Dr. Jennette KettleNeal as well and only true treatment option would be hip replacement which family is not interested in. Family relieved by no acute fracture and will continue to treat with heat and prn pain relievers such as tylenol.     Orders Placed This Encounter  Procedures  . DG Hip Bilateral W/Pelvis    Standing Status: Future     Number of Occurrences: 1     Standing Expiration Date: 10/06/2014    Order Specific Question:  Reason for Exam (SYMPTOM  OR DIAGNOSIS REQUIRED)    Answer:  fall 2 days ago. complains of hip pain.    Order Specific Question:  Preferred imaging location?    Answer:  [facility]  . DG Ribs Unilateral Left    Standing Status: Future     Number of Occurrences: 1     Standing Expiration Date: 10/06/2014    Order Specific Question:  Reason for Exam (SYMPTOM  OR DIAGNOSIS REQUIRED)    Answer:  fall 2 days ago, bruising and tenderness on left side of mid back    Order Specific Question:  Preferred imaging location?    Answer:  [facility]

## 2013-08-08 NOTE — Assessment & Plan Note (Signed)
Patient poor historian in ability to present pain. Family seems to think patient has focused on hip pain on the right. Obtained bilateral hip film with pelvis as a result. Also with pain over bruised spot on left ribs so obtained film in this area.   No acute fracture noted on either film. Reviewed with patient's daughter [name] who works in our office. There was incidentally AVN noted of the right femoral head. It is possible this fall aggravated this pain. Discussed with Dr. Jennette Kettle as well and only true treatment option would be hip replacement which family is not interested in. Family relieved by no acute fracture and will continue to treat with heat and prn pain relievers such as tylenol.

## 2013-08-12 ENCOUNTER — Telehealth: Payer: Self-pay | Admitting: *Deleted

## 2013-08-12 NOTE — Telephone Encounter (Signed)
Pam [phone] left message on MD/RX line following up on referral for in home health Aide.  Per Pam, pt is in need of a home health aide and would like to know if referral form was completed. Clovis Pu, RN

## 2013-08-13 NOTE — Telephone Encounter (Signed)
I have signed and faxed everything in my box for her---they need to check and see if they HAVE it--if not, they need to fax me another one THANKS! Denny Levy

## 2013-08-13 NOTE — Telephone Encounter (Signed)
Returned call to [facility] regarding pt referral.  Left voice message per Dr. Jennette Kettle.  See Dr. Donnetta Hail note.  Clovis Pu, RN

## 2013-08-17 ENCOUNTER — Encounter (HOSPITAL_COMMUNITY): Payer: Self-pay | Admitting: Emergency Medicine

## 2013-08-17 ENCOUNTER — Emergency Department (INDEPENDENT_AMBULATORY_CARE_PROVIDER_SITE_OTHER): Payer: Medicare Other

## 2013-08-17 ENCOUNTER — Emergency Department (INDEPENDENT_AMBULATORY_CARE_PROVIDER_SITE_OTHER)
Admission: EM | Admit: 2013-08-17 | Discharge: 2013-08-17 | Disposition: A | Discharge status: Home / Self Care | Payer: Medicare Other | Source: Home / Self Care | Attending: Family Medicine | Admitting: Family Medicine

## 2013-08-17 DIAGNOSIS — J40 Bronchitis, not specified as acute or chronic: Secondary | ICD-10-CM

## 2013-08-17 DIAGNOSIS — J9819 Other pulmonary collapse: Secondary | ICD-10-CM

## 2013-08-17 DIAGNOSIS — J9811 Atelectasis: Secondary | ICD-10-CM

## 2013-08-17 MED ORDER — DOXYCYCLINE HYCLATE 100 MG PO CAPS: 100.0000 mg | ORAL_CAPSULE | Freq: Two times a day (BID) | ORAL | Status: DC

## 2013-08-17 NOTE — ED Notes (Signed)
Incentive spirometer teaching with patient and family.  Measurement 850.

## 2013-08-17 NOTE — ED Notes (Signed)
Cough, chest congestion, sore throat sometimes, runny nose.  Symptoms for one week.   Patient c/o of side pain, family reports a fall that was xrayed and negative xray.

## 2013-08-17 NOTE — ED Provider Notes (Signed)
CSN: [id number]     Arrival date & time 08/17/13  1047 History   None    Chief Complaint  Patient presents with  . URI   (Consider location/radiation/quality/duration/timing/severity/associated sxs/prior Treatment) HPI Comments: Pt feels sx started in her head and are also now in her chest.   Patient is a 80 y.o. female presenting with URI. The history is provided by the patient.  URI Presenting symptoms: congestion, cough, rhinorrhea and sore throat   Presenting symptoms: no ear pain and no fever   Severity:  Moderate Onset quality:  Gradual Duration:  1 week Timing:  Constant Progression:  Worsening Chronicity:  New Relieved by:  Nothing Worsened by:  Nothing tried Ineffective treatments:  None tried Associated symptoms: no sinus pain   Risk factors: being elderly     Past Medical History  Diagnosis Date  . Hypertension   . Arthritis   . Spinal stenosis   . Osteoarthritis   . Anemia   . CHF (congestive heart failure)   . Heart murmur     aortic regurg  . Osteoporosis   . Chronic kidney disease   . Generalized OA 12/16/2011    Significant. Seems to have encompass all joints. Patient is complaining of significant pain but dementia allows patient to be happy.  . Dementia   . SunDown syndrome    Past Surgical History  Procedure Laterality Date  . Nephrectomy Right   . Tonsillectomy     Family History  Problem Relation Age of Onset  . Hypertension Daughter    History  Substance Use Topics  . Smoking status: Former Games developer  . Smokeless tobacco: Never Used     Comment: quit smoking in the 70's  . Alcohol Use: No   OB History   Grav Para Term Preterm Abortions TAB SAB Ect Mult Living                 Review of Systems  Constitutional: Negative for fever.  HENT: Positive for congestion, rhinorrhea, sinus pressure and sore throat. Negative for ear pain.   Respiratory: Positive for cough.     Allergies  Review of patient's allergies indicates no known  allergies.  Home Medications   Current Outpatient Rx  Name  Route  Sig  Dispense  Refill  . amLODipine (NORVASC) 5 MG tablet      TAKE 1 TABLET BY MOUTH DAILY   30 tablet   3   . aspirin 81 MG tablet   Oral   Take 1 tablet (81 mg total) by mouth daily.   30 tablet   3   . calcium-vitamin D (OSCAL WITH D) 500-200 MG-UNIT per tablet   Oral   Take 1 tablet by mouth daily.          Marland Kitchen docusate sodium (COLACE) 100 MG capsule   Oral   Take 100 mg by mouth 2 (two) times daily as needed for mild constipation.         Marland Kitchen doxycycline (VIBRAMYCIN) 100 MG capsule   Oral   Take 1 capsule (100 mg total) by mouth 2 (two) times daily.   28 capsule   0   . ferrous sulfate (FERROUSUL) 325 (65 FE) MG tablet   Oral   Take 325 mg by mouth daily.          . Multiple Vitamins-Minerals (SENIOR MULTIVITAMIN PLUS) TABS   Oral   Take 1 tablet by mouth 1 dose over 24 hours.   100 tablet  12   . QUEtiapine (SEROQUEL) 25 MG tablet   Oral   Take 1 tablet (25 mg total) by mouth at bedtime.   30 tablet   0    BP 128/63  Pulse 69  Temp(Src) 97.3 F (36.3 C) (Oral)  Resp 20 Physical Exam  Constitutional: She appears well-developed and well-nourished. She appears ill.  HENT:  Right Ear: External ear normal.  Left Ear: External ear normal.  Nose: No mucosal edema or rhinorrhea. Right sinus exhibits maxillary sinus tenderness and frontal sinus tenderness. Left sinus exhibits maxillary sinus tenderness and frontal sinus tenderness.  Mouth/Throat: Oropharynx is clear and moist and mucous membranes are normal.  B ear canals with cerumen, TMs not visible  Cardiovascular: Normal rate and regular rhythm.   Murmur audible  Pulmonary/Chest: Effort normal.  Crackles B lung bases    ED Course  Procedures (including critical care time) Labs Review Labs Reviewed - No data to display Imaging Review Dg Chest 2 View  08/17/2013   CLINICAL DATA:  Cough with brown sputum.  EXAM: CHEST  2 VIEW   COMPARISON:  DG RIBS UNILATERAL*L* dated 08/07/2013; DG CHEST 2 VIEW dated 05/30/2013  FINDINGS: Midline trachea. Cardiomegaly with atherosclerosis in the transverse aorta. No pleural effusion or pneumothorax. No congestive failure. Right upper lobe calcified granuloma or hamartoma. Mild bibasilar atelectasis.  IMPRESSION: Cardiomegaly, without congestive heart failure or acute disease.   Electronically Signed   By: Jeronimo GreavesKyle  Talbot M.D.   On: 08/17/2013 12:10      MDM   1. Bronchitis   2. Atelectasis of both lungs   discussed with Dr. Denyse Amassorey.  Rx doxycycline 100mg  BID #28. Pt to use incentive spirometer. Pt to f/u with pcp this coming week.      Cathlyn ParsonsAngela M Karolynn Infantino, NP 08/17/13 1253

## 2013-08-17 NOTE — Discharge Instructions (Signed)
Follow up with your doctor this coming week.  Use the incentive spirometer to ensure you are taking good deep breaths several times daily.     Bronchitis Bronchitis is inflammation of the airways that extend from the windpipe into the lungs (bronchi). The inflammation often causes mucus to develop, which leads to a cough. If the inflammation becomes severe, it may cause shortness of breath. CAUSES  Bronchitis may be caused by:   Viral infections.   Bacteria.   Cigarette smoke.   Allergens, pollutants, and other irritants.  SIGNS AND SYMPTOMS  The most common symptom of bronchitis is a frequent cough that produces mucus. Other symptoms include:  Fever.   Body aches.   Chest congestion.   Chills.   Shortness of breath.   Sore throat.  DIAGNOSIS  Bronchitis is usually diagnosed through a medical history and physical exam. Tests, such as chest X-rays, are sometimes done to rule out other conditions.  TREATMENT  You may need to avoid contact with whatever caused the problem (smoking, for example). Medicines are sometimes needed. These may include:  Antibiotics. These may be prescribed if the condition is caused by bacteria.  Cough suppressants. These may be prescribed for relief of cough symptoms.   Inhaled medicines. These may be prescribed to help open your airways and make it easier for you to breathe.   Steroid medicines. These may be prescribed for those with recurrent (chronic) bronchitis. HOME CARE INSTRUCTIONS  Get plenty of rest.   Drink enough fluids to keep your urine clear or pale yellow (unless you have a medical condition that requires fluid restriction). Increasing fluids may help thin your secretions and will prevent dehydration.   Only take over-the-counter or prescription medicines as directed by your health care provider.  Only take antibiotics as directed. Make sure you finish them even if you start to feel better.  Avoid secondhand  smoke, irritating chemicals, and strong fumes. These will make bronchitis worse. If you are a smoker, quit smoking. Consider using nicotine gum or skin patches to help control withdrawal symptoms. Quitting smoking will help your lungs heal faster.   Put a cool-mist humidifier in your bedroom at night to moisten the air. This may help loosen mucus. Change the water in the humidifier daily. You can also run the hot water in your shower and sit in the bathroom with the door closed for 5 10 minutes.   Follow up with your health care provider as directed.   Wash your hands frequently to avoid catching bronchitis again or spreading an infection to others.  SEEK MEDICAL CARE IF: Your symptoms do not improve after 1 week of treatment.  SEEK IMMEDIATE MEDICAL CARE IF:  Your fever increases.  You have chills.   You have chest pain.   You have worsening shortness of breath.   You have bloody sputum.  You faint.  You have lightheadedness.  You have a severe headache.   You vomit repeatedly. MAKE SURE YOU:   Understand these instructions.  Will watch your condition.  Will get help right away if you are not doing well or get worse. Document Released: 06/27/2005 Document Revised: 04/17/2013 Document Reviewed: 02/19/2013 [facility] Patient Information 2014 [place], [place].

## 2013-08-17 NOTE — ED Provider Notes (Signed)
Medical screening examination/treatment/procedure(s) were performed by a resident physician or non-physician practitioner and as the supervising physician I was immediately available for consultation/collaboration.  Shell Blanchette, MD    Muaad Boehning S Dayan Kreis, MD 08/17/13 2029 

## 2013-08-22 ENCOUNTER — Telehealth: Payer: Self-pay | Admitting: Family Medicine

## 2013-08-22 NOTE — Telephone Encounter (Signed)
Kathleen [name] with [facility] reports pt had a fall Monday night. She did her exercises on Monday with no complaints.  She did ok with exercises today but did mention some soreness

## 2013-08-23 NOTE — Telephone Encounter (Signed)
OK 

## 2013-09-12 ENCOUNTER — Encounter: Payer: Self-pay | Admitting: Family Medicine

## 2013-09-12 ENCOUNTER — Ambulatory Visit (INDEPENDENT_AMBULATORY_CARE_PROVIDER_SITE_OTHER): Payer: Medicare Other | Admitting: Family Medicine

## 2013-09-12 VITALS — BP 144/59 | HR 61

## 2013-09-12 DIAGNOSIS — R5381 Other malaise: Secondary | ICD-10-CM

## 2013-09-12 DIAGNOSIS — I4891 Unspecified atrial fibrillation: Secondary | ICD-10-CM

## 2013-09-12 DIAGNOSIS — I509 Heart failure, unspecified: Secondary | ICD-10-CM

## 2013-09-12 DIAGNOSIS — I480 Paroxysmal atrial fibrillation: Secondary | ICD-10-CM

## 2013-09-12 DIAGNOSIS — R5383 Other fatigue: Principal | ICD-10-CM

## 2013-09-12 LAB — POCT URINALYSIS DIPSTICK
BILIRUBIN UA: NEGATIVE
Glucose, UA: NEGATIVE
KETONES UA: NEGATIVE
Leukocytes, UA: NEGATIVE
Nitrite, UA: NEGATIVE
PH UA: 5.5
Protein, UA: NEGATIVE
RBC UA: NEGATIVE
Spec Grav, UA: 1.01
Urobilinogen, UA: 0.2

## 2013-09-12 NOTE — Progress Notes (Signed)
Subjective:    Patient ID: Kathleen [name], female    DOB: [birth year], 80 y.o.   MRN: [id number]  HPI Brought in by family members with complaint of 24-36 hours of malaise. Just not as energetic as she has been. Also not eating as well. She is still urinating normally, still having normal bowel habits. She does not complaining of any specific pain. She's not had cough or fevers that theyy are aware of.  She's been doing pretty well with the sundowning. Does not have to take her sleeping pill every night. She did have sleeping pill last night. Did not have one the night before.  Appetite intermittent. She's able to feed herself some of the time, the time she's in need of assistance. She needs assistance with her other ADLs. She is accompanied today by her granddaughter.  Review of Systems See history of present illness.    Objective:   Physical Exam Vital signs are reviewed GENERAL: Well-developed elderly female no acute distress eating pleasantly in a wheelchair. HEENT: No thyromegaly no lymphadenopathy no JVD. Neck is supple without tenderness to palpation. Oropharynx is somewhat dry but otherwise looks normal. Conjunctiva are nonicteric. Pupils are equal. CV: Regular rate and rhythm. She has two out of three murmur her breasts at the right sternal border. It does not radiate to the back or the axilla. LUNGS: Clear to auscultation in all lung fields with normal respiratory effort. Neuro: No gross focal neurological deficit. PSYCH: She is alert. She is oriented to person. She answer some of my questions. She smiles frequently. She does not seem agitated. She can follow commands that are simple. When I asked her how old she was, she said 80 (which is correct). EXTREMITY: Trace pitting edema bilaterally at the ankles. No edema in the hands. She has significant arthritic changes of the PIP/DIP and a few MCP joints of the hands. Her feet and hands are somewhat cool but her body temperature  on nonexposed skin seems normal as evaluated by touch. They're unable to get back her temperature on her by mouth. BACK: No CVA tenderness ABDOMEN: Soft, positive bowel sounds, nontender nondistended. URINALYSIS. Specific gravity 1.01. Otherwise negative.       Assessment & Plan:  Malaise and fatigue. When I had talked to her daughter earlier this like she might have either urinary tract infection or becoming a little dehydrated so had her bring her in for possible IV hydration. Her specific gravity seems well in the normal range. She looks hydrated. Other than the slightly cool extremities that are particularly noticeable when exposed skin areas, I find her exam entirely normal given her underlying medical problems. She has a history of low body temperature so I don't think this is anything unusual. She seems as alert as usual and very pleasant. I discussed with her granddaughter and with her daughter. At this time I would do nothing other than continued observation. Also difference in urine cups to take home so if we can do this crossers again perhaps they can ringer urine in, cautioning them that I would need a fresh urine sample that is less than 60 minutes all. I am always happy to see her at any time. #2. Paroxysmal atrial fibrillation and history of CHF. Currently she looks rate controlled and without any exacerbation., No medication changes. I was a little worried about starting her on Seroquel given her paroxysmal A. fib in her first-degree block but she seems to be tolerating the low dose quite well.  I don't think he would benefit us to get an EKG looking for prolonged QT at this time. #3. Sundowning. Family seems to be handling this well, we'll continue her current medicine regimen #4. No by temperature. She seems to be getting along perfectly well so we will leave well enough along

## 2013-09-16 ENCOUNTER — Emergency Department (HOSPITAL_COMMUNITY): Payer: Medicare Other

## 2013-09-16 ENCOUNTER — Inpatient Hospital Stay (HOSPITAL_COMMUNITY)
Admission: EM | Admit: 2013-09-16 | Discharge: 2013-09-21 | DRG: 071 | Disposition: A | Discharge status: Home Health | Payer: Medicare Other | Attending: Family Medicine | Admitting: Family Medicine

## 2013-09-16 DIAGNOSIS — I4891 Unspecified atrial fibrillation: Secondary | ICD-10-CM | POA: Diagnosis present

## 2013-09-16 DIAGNOSIS — R262 Difficulty in walking, not elsewhere classified: Secondary | ICD-10-CM

## 2013-09-16 DIAGNOSIS — I509 Heart failure, unspecified: Secondary | ICD-10-CM

## 2013-09-16 DIAGNOSIS — R4182 Altered mental status, unspecified: Secondary | ICD-10-CM

## 2013-09-16 DIAGNOSIS — N183 Chronic kidney disease, stage 3 unspecified: Secondary | ICD-10-CM

## 2013-09-16 DIAGNOSIS — Z8249 Family history of ischemic heart disease and other diseases of the circulatory system: Secondary | ICD-10-CM

## 2013-09-16 DIAGNOSIS — R6510 Systemic inflammatory response syndrome (SIRS) of non-infectious origin without acute organ dysfunction: Secondary | ICD-10-CM | POA: Diagnosis present

## 2013-09-16 DIAGNOSIS — M159 Polyosteoarthritis, unspecified: Secondary | ICD-10-CM

## 2013-09-16 DIAGNOSIS — Z7982 Long term (current) use of aspirin: Secondary | ICD-10-CM

## 2013-09-16 DIAGNOSIS — R000 Tachycardia, unspecified: Secondary | ICD-10-CM | POA: Diagnosis present

## 2013-09-16 DIAGNOSIS — I480 Paroxysmal atrial fibrillation: Secondary | ICD-10-CM

## 2013-09-16 DIAGNOSIS — N179 Acute kidney failure, unspecified: Secondary | ICD-10-CM

## 2013-09-16 DIAGNOSIS — D72829 Elevated white blood cell count, unspecified: Secondary | ICD-10-CM | POA: Diagnosis present

## 2013-09-16 DIAGNOSIS — G9340 Encephalopathy, unspecified: Principal | ICD-10-CM

## 2013-09-16 DIAGNOSIS — Z79899 Other long term (current) drug therapy: Secondary | ICD-10-CM

## 2013-09-16 DIAGNOSIS — D649 Anemia, unspecified: Secondary | ICD-10-CM

## 2013-09-16 DIAGNOSIS — R6889 Other general symptoms and signs: Secondary | ICD-10-CM

## 2013-09-16 DIAGNOSIS — Z9181 History of falling: Secondary | ICD-10-CM

## 2013-09-16 DIAGNOSIS — E861 Hypovolemia: Secondary | ICD-10-CM | POA: Diagnosis present

## 2013-09-16 DIAGNOSIS — I10 Essential (primary) hypertension: Secondary | ICD-10-CM

## 2013-09-16 DIAGNOSIS — R404 Transient alteration of awareness: Secondary | ICD-10-CM | POA: Diagnosis present

## 2013-09-16 DIAGNOSIS — R451 Restlessness and agitation: Secondary | ICD-10-CM

## 2013-09-16 DIAGNOSIS — M810 Age-related osteoporosis without current pathological fracture: Secondary | ICD-10-CM | POA: Diagnosis present

## 2013-09-16 DIAGNOSIS — E873 Alkalosis: Secondary | ICD-10-CM | POA: Diagnosis present

## 2013-09-16 DIAGNOSIS — R634 Abnormal weight loss: Secondary | ICD-10-CM

## 2013-09-16 DIAGNOSIS — I129 Hypertensive chronic kidney disease with stage 1 through stage 4 chronic kidney disease, or unspecified chronic kidney disease: Secondary | ICD-10-CM | POA: Diagnosis present

## 2013-09-16 DIAGNOSIS — Z87891 Personal history of nicotine dependence: Secondary | ICD-10-CM

## 2013-09-16 DIAGNOSIS — I359 Nonrheumatic aortic valve disorder, unspecified: Secondary | ICD-10-CM | POA: Diagnosis present

## 2013-09-16 DIAGNOSIS — F0390 Unspecified dementia without behavioral disturbance: Secondary | ICD-10-CM

## 2013-09-16 DIAGNOSIS — I5030 Unspecified diastolic (congestive) heart failure: Secondary | ICD-10-CM | POA: Diagnosis present

## 2013-09-16 DIAGNOSIS — R198 Other specified symptoms and signs involving the digestive system and abdomen: Secondary | ICD-10-CM

## 2013-09-16 LAB — URINALYSIS, ROUTINE W REFLEX MICROSCOPIC
BILIRUBIN URINE: NEGATIVE
Glucose, UA: NEGATIVE mg/dL
Hgb urine dipstick: NEGATIVE
Ketones, ur: NEGATIVE mg/dL
Leukocytes, UA: NEGATIVE
NITRITE: NEGATIVE
PROTEIN: NEGATIVE mg/dL
SPECIFIC GRAVITY, URINE: 1.016 (ref 1.005–1.030)
UROBILINOGEN UA: 0.2 mg/dL (ref 0.0–1.0)
pH: 5 (ref 5.0–8.0)

## 2013-09-16 LAB — I-STAT ARTERIAL BLOOD GAS, ED
Acid-Base Excess: 6 mmol/L — ABNORMAL HIGH (ref 0.0–2.0)
BICARBONATE: 28.2 meq/L — AB (ref 20.0–24.0)
O2 Saturation: 99 %
PCO2 ART: 28.7 mmHg — AB (ref 35.0–45.0)
PH ART: 7.601 — AB (ref 7.350–7.450)
TCO2: 29 mmol/L (ref 0–100)
pO2, Arterial: 115 mmHg — ABNORMAL HIGH (ref 80.0–100.0)

## 2013-09-16 LAB — I-STAT CHEM 8, ED
BUN: 62 mg/dL — ABNORMAL HIGH (ref 6–23)
CALCIUM ION: 1.29 mmol/L (ref 1.13–1.30)
CREATININE: 1.7 mg/dL — AB (ref 0.50–1.10)
Chloride: 105 mEq/L (ref 96–112)
GLUCOSE: 182 mg/dL — AB (ref 70–99)
HCT: 34 % — ABNORMAL LOW (ref 36.0–46.0)
HEMOGLOBIN: 11.6 g/dL — AB (ref 12.0–15.0)
Potassium: 4.9 mEq/L (ref 3.7–5.3)
Sodium: 142 mEq/L (ref 137–147)
TCO2: 29 mmol/L (ref 0–100)

## 2013-09-16 LAB — COMPREHENSIVE METABOLIC PANEL
ALT: 21 U/L (ref 0–35)
AST: 31 U/L (ref 0–37)
Albumin: 3.1 g/dL — ABNORMAL LOW (ref 3.5–5.2)
Alkaline Phosphatase: 103 U/L (ref 39–117)
BILIRUBIN TOTAL: 0.3 mg/dL (ref 0.3–1.2)
BUN: 44 mg/dL — ABNORMAL HIGH (ref 6–23)
CALCIUM: 10 mg/dL (ref 8.4–10.5)
CO2: 25 meq/L (ref 19–32)
CREATININE: 1.47 mg/dL — AB (ref 0.50–1.10)
Chloride: 102 mEq/L (ref 96–112)
GFR, EST AFRICAN AMERICAN: 36 mL/min — AB (ref >90)
GFR, EST NON AFRICAN AMERICAN: 31 mL/min — AB (ref >90)
Glucose, Bld: 173 mg/dL — ABNORMAL HIGH (ref 70–99)
Potassium: 4.7 mEq/L (ref 3.7–5.3)
SODIUM: 140 meq/L (ref 137–147)
Total Protein: 7.8 g/dL (ref 6.0–8.3)

## 2013-09-16 LAB — CBC
HCT: 30 % — ABNORMAL LOW (ref 36.0–46.0)
Hemoglobin: 10.8 g/dL — ABNORMAL LOW (ref 12.0–15.0)
MCH: 30.5 pg (ref 26.0–34.0)
MCHC: 36 g/dL (ref 30.0–36.0)
MCV: 84.7 fL (ref 78.0–100.0)
Platelets: 144 10*3/uL — ABNORMAL LOW (ref 150–400)
RBC: 3.54 MIL/uL — AB (ref 3.87–5.11)
RDW: 16.3 % — ABNORMAL HIGH (ref 11.5–15.5)
WBC: 14.6 10*3/uL — ABNORMAL HIGH (ref 4.0–10.5)

## 2013-09-16 LAB — CBG MONITORING, ED: Glucose-Capillary: 162 mg/dL — ABNORMAL HIGH (ref 70–99)

## 2013-09-16 LAB — I-STAT TROPONIN, ED: Troponin i, poc: 0.02 ng/mL (ref 0.00–0.08)

## 2013-09-16 LAB — CK: Total CK: 260 U/L — ABNORMAL HIGH (ref 7–177)

## 2013-09-16 LAB — I-STAT CG4 LACTIC ACID, ED: Lactic Acid, Venous: 1.62 mmol/L (ref 0.5–2.2)

## 2013-09-16 MED ORDER — VANCOMYCIN HCL IN DEXTROSE 1-5 GM/200ML-% IV SOLN
1000.0000 mg | Freq: Once | INTRAVENOUS | Status: AC
  Administered 2013-09-16: 1000 mg via INTRAVENOUS
  Filled 2013-09-16: qty 200

## 2013-09-16 MED ORDER — PIPERACILLIN-TAZOBACTAM 3.375 G IVPB
3.3750 g | Freq: Once | INTRAVENOUS | Status: AC
  Administered 2013-09-16: 3.375 g via INTRAVENOUS
  Filled 2013-09-16: qty 50

## 2013-09-16 NOTE — ED Provider Notes (Signed)
CSN: [id number]     Arrival date & time 09/16/13  2125 History   First MD Initiated Contact with Patient 09/16/13 2138     No chief complaint on file.    Patient is a 80 y.o. female presenting with altered mental status. The history is provided by a relative.  Altered Mental Status Presenting symptoms: lethargy and partial responsiveness   Severity:  Moderate Most recent episode:  More than 2 days ago (3 weeks) Episode history:  Continuous Duration:  3 weeks Timing:  Constant Progression:  Worsening Chronicity:  New Context: dementia and drug use ( seroquel )   Associated symptoms: decreased appetite and weakness     Past Medical History  Diagnosis Date  . Hypertension   . Arthritis   . Spinal stenosis   . Osteoarthritis   . Anemia   . CHF (congestive heart failure)   . Heart murmur     aortic regurg  . Osteoporosis   . Chronic kidney disease   . Generalized OA 12/16/2011    Significant. Seems to have encompass all joints. Patient is complaining of significant pain but dementia allows patient to be happy.  . Dementia   . SunDown syndrome    Past Surgical History  Procedure Laterality Date  . Nephrectomy Right   . Tonsillectomy     Family History  Problem Relation Age of Onset  . Hypertension Daughter    History  Substance Use Topics  . Smoking status: Former Games developermoker  . Smokeless tobacco: Never Used     Comment: quit smoking in the 70's  . Alcohol Use: No   OB History   Grav Para Term Preterm Abortions TAB SAB Ect Mult Living                 Review of Systems  Unable to perform ROS: Patient nonverbal  Constitutional: Positive for decreased appetite.  Neurological: Positive for weakness.      Allergies  Review of patient's allergies indicates no known allergies.  Home Medications   Current Outpatient Rx  Name  Route  Sig  Dispense  Refill  . amLODipine (NORVASC) 5 MG tablet   Oral   Take 5 mg by mouth daily.         Marland Kitchen. aspirin 81 MG tablet  Oral   Take 1 tablet (81 mg total) by mouth daily.   30 tablet   3   . calcium-vitamin D (OSCAL WITH D) 500-200 MG-UNIT per tablet   Oral   Take 1 tablet by mouth daily.          . ferrous sulfate (FERROUSUL) 325 (65 FE) MG tablet   Oral   Take 325 mg by mouth daily.          . Multiple Vitamins-Minerals (SENIOR MULTIVITAMIN PLUS) TABS   Oral   Take 1 tablet by mouth 1 dose over 24 hours.   100 tablet   12   . QUEtiapine (SEROQUEL) 25 MG tablet   Oral   Take 1 tablet (25 mg total) by mouth at bedtime.   30 tablet   0   . doxycycline (VIBRAMYCIN) 100 MG capsule   Oral   Take 100 mg by mouth once.          BP 135/82  Pulse 85  Temp(Src) 95.6 F (35.3 C) (Rectal)  SpO2 100% Physical Exam  Constitutional: She appears well-developed and well-nourished. No distress.  Cachectic   HENT:  Head: Normocephalic and atraumatic.  Nose: Nose  normal.  Mouth/Throat: Oropharynx is clear and moist.  Eyes: Pupils are equal, round, and reactive to light. Right eye exhibits no discharge. Left eye exhibits no discharge.  Neck: Neck supple. No tracheal deviation present.  Cardiovascular: Normal rate, regular rhythm and intact distal pulses.   Murmur heard. Pulmonary/Chest: Effort normal and breath sounds normal. No stridor. No respiratory distress. She has no rales.  Abdominal: Soft. Bowel sounds are normal. She exhibits no distension.  Musculoskeletal: She exhibits no edema.  Neurological:  Patient is non verbal. Has gag reflex and is maintaining her airway.    Skin: Skin is warm and dry. No rash noted.    ED Course  Procedures (including critical care time) Labs Review Labs Reviewed  CBC - Abnormal; Notable for the following:    WBC 14.6 (*)    RBC 3.54 (*)    Hemoglobin 10.8 (*)    HCT 30.0 (*)    RDW 16.3 (*)    Platelets 144 (*)    All other components within normal limits  COMPREHENSIVE METABOLIC PANEL - Abnormal; Notable for the following:    Glucose, Bld 173  (*)    BUN 44 (*)    Creatinine, Ser 1.47 (*)    Albumin 3.1 (*)    GFR calc non Af Amer 31 (*)    GFR calc Af Amer 36 (*)    All other components within normal limits  CK - Abnormal; Notable for the following:    Total CK 260 (*)    All other components within normal limits  CBG MONITORING, ED - Abnormal; Notable for the following:    Glucose-Capillary 162 (*)    All other components within normal limits  I-STAT CHEM 8, ED - Abnormal; Notable for the following:    BUN 62 (*)    Creatinine, Ser 1.70 (*)    Glucose, Bld 182 (*)    Hemoglobin 11.6 (*)    HCT 34.0 (*)    All other components within normal limits  I-STAT ARTERIAL BLOOD GAS, ED - Abnormal; Notable for the following:    pH, Arterial 7.601 (*)    pCO2 arterial 28.7 (*)    pO2, Arterial 115.0 (*)    Bicarbonate 28.2 (*)    Acid-Base Excess 6.0 (*)    All other components within normal limits  URINE CULTURE  CULTURE, BLOOD (ROUTINE X 2)  CULTURE, BLOOD (ROUTINE X 2)  URINALYSIS, ROUTINE W REFLEX MICROSCOPIC  BLOOD GAS, ARTERIAL  TSH  I-STAT CG4 LACTIC ACID, ED  I-STAT TROPOININ, ED  CBG MONITORING, ED   Imaging Review Ct Head Wo Contrast  09/16/2013   CLINICAL DATA:  Altered mental status.  EXAM: CT HEAD WITHOUT CONTRAST  TECHNIQUE: Contiguous axial images were obtained from the base of the skull through the vertex without intravenous contrast.  COMPARISON:  CT scan of May 30, 2013.  FINDINGS: Bony calvarium appears intact. Mild diffuse cortical atrophy is noted. 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 No mass effect or midline shift is noted. Ventricular size is within normal limits. There is no evidence of mass lesion, hemorrhage or acute infarction. Old lacunar infarction is noted in left basal ganglia.  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 Mild diffuse cortical atrophy. No acute intracranial abnormality seen.   Electronically Signed    By: Roque Lias M.D.   On: 09/16/2013 22:41   Dg Pelvis Portable  09/16/2013   CLINICAL DATA:  The patient fell tonight.  Altered mental status.  EXAM: PORTABLE PELVIS 1-2 VIEWS  COMPARISON:  08/07/2013  FINDINGS: There is a large calcified mass in the pelvis, probably representing a fibroid, unchanged. There mild left and moderate right arthritic changes of the hips. No acute osseous abnormality of the pelvis.  IMPRESSION: No acute abnormalities.   Electronically Signed   By: Geanie Cooley M.D.   On: 09/16/2013 22:46   Dg Chest Portable 1 View  09/16/2013   CLINICAL DATA:  Altered mental status.  The patient fell tonight.  EXAM: PORTABLE CHEST - 1 VIEW  COMPARISON:  08/17/2013  FINDINGS: Heart size and pulmonary vascularity are normal. Minimal atelectasis at the right base. There severe chronic changes of both shoulders. No acute osseous abnormalities.  IMPRESSION: Minimal atelectasis at the right lung base.   Electronically Signed   By: Geanie Cooley M.D.   On: 09/16/2013 22:41    Date: 09/16/2013  Rate: 81  Rhythm: normal sinus rhythm  QRS Axis: normal  Intervals: PR prolonged  ST/T Wave abnormalities: nonspecific ST/T changes  Conduction Disutrbances:none  Narrative Interpretation:   Old EKG Reviewed: within limitations of this EKG no significant change compared to previous    MDM   Final diagnoses:  Altered mental status      80 yo F presents with 3 weeks of progressive sleepiness. Patient was seen by PCP ~ 2 weeks ago and per family a urine was checked and was normal. Yesterday morning daughters gave the patient a dose of Seroquel and since then they feel her sleepiness has gotten worse. She now will not follow any commands or open her eyes. On arrival she has a gag relfex and appears to be protecting her airway. Per family she is a FULL CODE. Patient noted to be tachypnea and hypothermic. Blood and urine cultures sent. ABX given in ED. Vanc/Zosyn. CXR was read by radiology as NACPD.  CT head with NAICA. UA without UTI. Patient is not hypercapnic.  EKG with significant amount of artifact but within the limitations not significantly different from prior. Troponin wnl. NA is wnl. ABG with a metabolic alkalosis. No hx of diarrhea or emesis. No clear etiology for this metabolic process. CXR with odd appearing R shoulder will obtain a dedicated shoulder film. TSH ordered to r/o myxedema coma. Case co managed with my attending Dr. Rhunette Croft. Patient admitted to Step down unit under the care of Family Medicine team.   Nadara Mustard, MD 09/16/13 2342

## 2013-09-16 NOTE — H&P (Signed)
Family Medicine Teaching [facility] Admission History and Physical Service Pager: [phone]  Patient name: Kathleen [name] Medical record number: [id number] Date of birth: Feb 28, 1923 Age: 80 y.o. Gender: female  Primary Care Provider: Denny Levy, MD Consultants: none Code Status: Full  Chief Complaint: acute encephalopathy  Assessment and Plan: Kathleen [name] is a 80 y.o. female presenting with altered mental status x 2 days, progressive decline over last several weeks. PMH is significant for dementia, history of paroxysmal afib, CKD stage III, and diastolic CHF  # Acute encephalopathy: apparent progressively worsened over past several weeks with minimal sleep the past week, given seroquel 25mg  on early morning of 3/8 (first time receiving for agitation in a long time). Today did stand and spoke a few words but since being in ED has been unresponsive. Labs: normal electrolytes, creatinine 1.47 (baseline ~1.4), WBC 14.6, Hgb 10.8 (baseline ~11), CK 260 (appears chronically elevated, most in 300-400 range), i-stat lactic acid 1.62, ABG pH 7.601 / pCO2 28.7 / pO2 115 / Bicarb 28.2, acid-base excess 6.0. CT head with diffuse cortical atrophy but no acute intracranial abnormality. No clear etiology at this point, could be component of dementia or hypoactive delirium exacerbated by recent use of seroquel. Hemorrhagic stroke not seen by CT (though no focal findings on neuro exam other than asymmetric reflexes), infectious etiology (see below). - admit to SDU - continuous tele/pulse ox - labs = pending TSH, in AM CBC, Bmet - cycle troponins x 3 - consider MRI if not improved to rule out ischemic stroke  # SIRS: meets criteria based on hypothermia (95.44F), leukocytosis (WBC 14.6); no known source with CXR no evidence of infiltrate, UA cath specimen completely normal, no evidence of skin breakdown on exam. i-stat lactic acid normal at 1.6. Has had hypothermia in the past with no clear cause.  No evidence of hypoglycemia as cause of hypothermia. TSH pending to rule out thyroid etiology.  - vanc (3/9>>) and zosyn (3/9>>) - pending blood and urine cultures  # Mixed metabolic and respiratory alkalosis: pH 7.601, bicarb 28.2 and pCO2 28.7, would suspect if primary metabolic alkalosis pCO2 should be 39.7  5; if primary acute respiratory alkalosis would expect bicarb to be 21.7. Unclear exactly what would be causing this, as she is not on any diuretics, no reported GI losses (though did have some diarrhea in ED), she is not either at risk for posthypercapnic alkalosis.  PE could also cause respiratory alkalosis, but patient has not been hypoxic nor tachycardic on admission, making it less likely.  - Discussed case with Dr. Hyman Hopes with nephrology who confirmed mixed respiratory and metabolic alkalosis with primary respiratory. Recommended fluid replacement with NS and recheck ABG in a couple of hours. He also pointed out that temperature may affect ABG reading as well. Looking this up, it appears that the pH increases and the PaO2 and PaCO2 decrease as the temperature decreases. This could be playing a role in the blood gas result. Will therefore repeat ABG once warming has occurred.  - will volume expand with normal saline bolus and maintenance fluids, since patient does seem to have had some decreased oral intake in the last few days and appears slightly hypovolemic on exam.   # Dementia: history of sundowning and delirium, prescribed seroquel 25mg  but rarely takes this. - hold seroquel  # CKD: creatinine on admission 1.47, appears to be around baseline of 1.4 - IV hydration  # CHF: most recent echo 05/2013 with EF 55-65% does not appear  fluid overloaded on exam - monitor clinically  # Paroxysmal afib: EKG with sinus rhythm, bradycardia in 50s near end of exam but otherwise hemodynamically stable - on telemetry  FEN/GI: NS at 100cc/hr, NS bolus 500cc x1 Prophylaxis: heparin  Disposition:  admit to SDU  History of Present Illness: Kathleen [name] is a 80 y.o. female presenting with altered mental status for past 2 days after several weeks of progressive worsening. Level 5 caveat as patient is non-verbal during exam, most of history provided by daughter, [name]. Over the past several weeks she has been acting more sleepy, though the past week has not gotten any sleep. Brought in to PCP office on 3/5 as the family suspected she had a UTI, however UA was normal. Early Sunday morning 3/8 she had some increased agitation for which she was given seroquel 25mg  (very rarely gets this, daughter cannot remember last time she had it). Monday morning she ate breakfast, however throughout the day became more non-responsive, refusing lunch and sleeping for most of the day. She was able to stand somewhat during the day, but eventually daughter asked if she needed to go to the hospital to which she replied "yes". While in the ED she has not spoken and has kept her eyes closed; she did have loose green stool/diarrhea.  Her daughter denies any vomiting, any diarrhea. She denies any cough or difficulty breathing. No skin breakdown or sores.   Review Of Systems: Per HPI with the following additions: none Otherwise 12 point review of systems was performed and was unremarkable.  Patient Active Problem List   Diagnosis Date Noted  . Altered mental status 09/16/2013  . Hip pain, bilateral 08/07/2013  . Unintentional weight loss 06/13/2013  . Agitation 06/12/2013  . Difficulty walking 03/06/2013  . 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 Body temperature low 02/18/2013  . Irregular bowel habits 11/08/2012  . At high risk for falls 10/02/2012  . Anemia 02/24/2012  . Generalized OA 12/16/2011  . Unspecified conditions influencing health status 12/16/2011  . Dementia 10/13/2010  . Paroxysmal atrial fibrillation 10/13/2010  . Hypertension 10/13/2010  . Kidney disease,  chronic, stage III (GFR 30-59 ml/min) 10/13/2010  . Osteoporosis 10/13/2010  . CHF (congestive heart failure) 10/13/2010   Past Medical History: Past Medical History  Diagnosis Date  . Hypertension   . Arthritis   . Spinal stenosis   . Osteoarthritis   . Anemia   . CHF (congestive heart failure)   . Heart murmur     aortic regurg  . Osteoporosis   . Chronic kidney disease   . Generalized OA 12/16/2011    Significant. Seems to have encompass all joints. Patient is complaining of significant pain but dementia allows patient to be happy.  . Dementia   . SunDown syndrome    Past Surgical History: Past Surgical History  Procedure Laterality Date  . Nephrectomy Right   . Tonsillectomy     Social History: History  Substance Use Topics  . Smoking status: Former Games developer  . Smokeless tobacco: Never Used     Comment: quit smoking in the 70's  . Alcohol Use: No   Additional social history: lives at [facility]  Please also refer to relevant sections of EMR.  Family History: Family History  Problem Relation Age of Onset  . Hypertension Daughter    Allergies and Medications: No Known Allergies No current facility-administered medications on file prior to encounter.   Current Outpatient Prescriptions on  File Prior to Encounter  Medication Sig Dispense Refill  . aspirin 81 MG tablet Take 1 tablet (81 mg total) by mouth daily.  30 tablet  3  . calcium-vitamin D (OSCAL WITH D) 500-200 MG-UNIT per tablet Take 1 tablet by mouth daily.       . ferrous sulfate (FERROUSUL) 325 (65 FE) MG tablet Take 325 mg by mouth daily.       . Multiple Vitamins-Minerals (SENIOR MULTIVITAMIN PLUS) TABS Take 1 tablet by mouth 1 dose over 24 hours.  100 tablet  12  . QUEtiapine (SEROQUEL) 25 MG tablet Take 1 tablet (25 mg total) by mouth at bedtime.  30 tablet  0    Objective: BP 135/82  Pulse 85  Temp(Src) 95.6 F (35.3 C) (Rectal)  SpO2 100% Exam: General: non-verbal, eyes closed, appears  comfortable HEENT: Does not open eyes spontaneous, when opened they are both deviated to left, pupils equal, round, reactive to light. Does not open mouth. Cardiovascular: RRR, normal S1/S2, 2/6 systolic murmur best heard at right sternal border appreciated Respiratory: CTAB, normal effort, regular rate Abdomen: mostly soft but did feel to have small stool burden, nontender, nondistended, bowel sounds present Extremities: no edema or cyanosis, legs are cool to touch Skin: no rashes, no skin breakdown noted in sacral area Neuro: non-responsive. GCS 6 (1 / 1 / 4), withdraws to pain. Tremor in both UE present (baseline per daughter). Squeezes both hands slightly spontaneously. 2+ patellar reflex on left, right sided patellar reflex absent and causes left leg to jerk  Labs and Imaging: CBC BMET   Recent Labs Lab 09/16/13 2205  WBC 14.6*  HGB 10.8*  HCT 30.0*  PLT 144*    Recent Labs Lab 09/16/13 2205  NA 140  K 4.7  CL 102  CO2 25  BUN 44*  CREATININE 1.47*  GLUCOSE 173*  CALCIUM 10.0     i-Stat troponin 0.00 UA: normal i-Stat lactic acid: 1.62 ABG pH 7.601 / pCO2 28.7 / pO2 115 / Bicarb 28.2, acid-base excess 6.0  Ct Head Wo Contrast  09/16/2013   CLINICAL DATA:  Altered mental status.  EXAM: CT HEAD WITHOUT CONTRAST  TECHNIQUE: Contiguous axial images were obtained from the base of the skull through the vertex without intravenous contrast.  COMPARISON:  CT scan of May 30, 2013.  FINDINGS: Bony calvarium appears intact. Mild diffuse cortical atrophy is noted. 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 No mass effect or midline shift is noted. Ventricular size is within normal limits. There is no evidence of mass lesion, hemorrhage or acute infarction. Old lacunar infarction is noted in left basal ganglia.  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 Mild diffuse cortical atrophy. No acute intracranial  abnormality seen.   Electronically Signed   By: Roque Lias M.D.   On: 09/16/2013 22:41   Dg Pelvis Portable  09/16/2013   CLINICAL DATA:  The patient fell tonight.  Altered mental status.  EXAM: PORTABLE PELVIS 1-2 VIEWS  COMPARISON:  08/07/2013  FINDINGS: There is a large calcified mass in the pelvis, probably representing a fibroid, unchanged. There mild left and moderate right arthritic changes of the hips. No acute osseous abnormality of the pelvis.  IMPRESSION: No acute abnormalities.   Electronically Signed   By: Geanie Cooley M.D.   On: 09/16/2013 22:46   Dg Chest Portable 1 View  09/16/2013   CLINICAL DATA:  Altered mental status.  The patient  fell tonight.  EXAM: PORTABLE CHEST - 1 VIEW  COMPARISON:  08/17/2013  FINDINGS: Heart size and pulmonary vascularity are normal. Minimal atelectasis at the right base. There severe chronic changes of both shoulders. No acute osseous abnormalities.  IMPRESSION: Minimal atelectasis at the right lung base.   Electronically Signed   By: Geanie CooleyJim  Maxwell M.D.   On: 09/16/2013 22:41   Dg Shoulder Right Port  09/16/2013   CLINICAL DATA:  Fall.  EXAM: PORTABLE RIGHT SHOULDER - 2+ VIEW  COMPARISON:  Chest radiograph of May 30, 2013; shoulder radiograph of May 28, 2011.  FINDINGS: Deformity of the right proximal humeral head is again noted which is unchanged compared to prior exam. Deformity of glenoid fossa is also noted which is unchanged consistent with degenerative joint disease. Visualized ribs appear normal. No acute fracture or dislocation is noted. Mild inferior subluxation of the proximal right humeral head is noted.  IMPRESSION: No acute fracture is noted. Mild inferior subluxation of proximal right humeral head is noted most likely due to ligamentous laxity. Severe deformity of the glenoid fossa of proximal right humeral head is noted secondary to degenerative joint disease.   Electronically Signed   By: Roque LiasJames  Green M.D.   On: 09/16/2013 23:57     Tawni CarnesAndrew Wight, MD 09/16/2013, 11:12 PM PGY-1, [facility] FPTS Intern pager: [phone], text pages welcome  Patient seen, examined. Available data reviewed. Agree with findings, assessment, and plan as outlined by Dr. Waynetta SandyWight.  My additional findings are documented and highlighted above in blue.   Marena ChancyStephanie Mikyle Sox, PGY-3 Family Medicine Resident

## 2013-09-16 NOTE — ED Notes (Signed)
Lactic Acid results reported to Dr.Nanavati

## 2013-09-17 ENCOUNTER — Inpatient Hospital Stay (HOSPITAL_COMMUNITY): Payer: Medicare Other

## 2013-09-17 ENCOUNTER — Encounter (HOSPITAL_COMMUNITY): Payer: Self-pay | Admitting: *Deleted

## 2013-09-17 DIAGNOSIS — Z9181 History of falling: Secondary | ICD-10-CM

## 2013-09-17 DIAGNOSIS — R6889 Other general symptoms and signs: Secondary | ICD-10-CM

## 2013-09-17 DIAGNOSIS — R634 Abnormal weight loss: Secondary | ICD-10-CM

## 2013-09-17 DIAGNOSIS — R262 Difficulty in walking, not elsewhere classified: Secondary | ICD-10-CM

## 2013-09-17 DIAGNOSIS — F0390 Unspecified dementia without behavioral disturbance: Secondary | ICD-10-CM

## 2013-09-17 DIAGNOSIS — D649 Anemia, unspecified: Secondary | ICD-10-CM

## 2013-09-17 DIAGNOSIS — I509 Heart failure, unspecified: Secondary | ICD-10-CM

## 2013-09-17 DIAGNOSIS — I4891 Unspecified atrial fibrillation: Secondary | ICD-10-CM

## 2013-09-17 DIAGNOSIS — N183 Chronic kidney disease, stage 3 unspecified: Secondary | ICD-10-CM

## 2013-09-17 DIAGNOSIS — G9340 Encephalopathy, unspecified: Principal | ICD-10-CM

## 2013-09-17 DIAGNOSIS — I10 Essential (primary) hypertension: Secondary | ICD-10-CM

## 2013-09-17 DIAGNOSIS — R4182 Altered mental status, unspecified: Secondary | ICD-10-CM

## 2013-09-17 DIAGNOSIS — IMO0002 Reserved for concepts with insufficient information to code with codable children: Secondary | ICD-10-CM

## 2013-09-17 LAB — TROPONIN I
Troponin I: <0.3 ng/mL
Troponin I: <0.3 ng/mL
Troponin I: <0.3 ng/mL (ref <0.30)

## 2013-09-17 LAB — BLOOD GAS, ARTERIAL
Acid-Base Excess: 2.5 mmol/L — ABNORMAL HIGH (ref 0.0–2.0)
Bicarbonate: 26.2 meq/L — ABNORMAL HIGH (ref 20.0–24.0)
Drawn by: 28337
FIO2: 0.21 %
O2 Saturation: 95.4 %
Patient temperature: 98.6
TCO2: 27.4 mmol/L (ref 0–100)
pCO2 arterial: 38.7 mmHg (ref 35.0–45.0)
pH, Arterial: 7.445 (ref 7.350–7.450)
pO2, Arterial: 76.4 mmHg — ABNORMAL LOW (ref 80.0–100.0)

## 2013-09-17 LAB — CBC WITH DIFFERENTIAL/PLATELET
Basophils Absolute: 0 10*3/uL (ref 0.0–0.1)
Basophils Relative: 0 % (ref 0–1)
Eosinophils Absolute: 0.1 10*3/uL (ref 0.0–0.7)
Eosinophils Relative: 0 % (ref 0–5)
HCT: 26.7 % — ABNORMAL LOW (ref 36.0–46.0)
HEMOGLOBIN: 9.6 g/dL — AB (ref 12.0–15.0)
LYMPHS PCT: 9 % — AB (ref 12–46)
Lymphs Abs: 1.5 10*3/uL (ref 0.7–4.0)
MCH: 30.1 pg (ref 26.0–34.0)
MCHC: 36 g/dL (ref 30.0–36.0)
MCV: 83.7 fL (ref 78.0–100.0)
Monocytes Absolute: 0.8 10*3/uL (ref 0.1–1.0)
Monocytes Relative: 5 % (ref 3–12)
NEUTROS PCT: 85 % — AB (ref 43–77)
Neutro Abs: 13.8 10*3/uL — ABNORMAL HIGH (ref 1.7–7.7)
PLATELETS: 130 10*3/uL — AB (ref 150–400)
RBC: 3.19 MIL/uL — AB (ref 3.87–5.11)
RDW: 16.2 % — ABNORMAL HIGH (ref 11.5–15.5)
WBC: 16.2 10*3/uL — ABNORMAL HIGH (ref 4.0–10.5)

## 2013-09-17 LAB — MRSA PCR SCREENING: MRSA BY PCR: NEGATIVE

## 2013-09-17 LAB — BASIC METABOLIC PANEL
BUN: 39 mg/dL — AB (ref 6–23)
CHLORIDE: 105 meq/L (ref 96–112)
CO2: 26 meq/L (ref 19–32)
Calcium: 9.7 mg/dL (ref 8.4–10.5)
Creatinine, Ser: 1.5 mg/dL — ABNORMAL HIGH (ref 0.50–1.10)
GFR calc Af Amer: 35 mL/min — ABNORMAL LOW (ref >90)
GFR calc non Af Amer: 30 mL/min — ABNORMAL LOW (ref >90)
GLUCOSE: 76 mg/dL (ref 70–99)
POTASSIUM: 4.8 meq/L (ref 3.7–5.3)
SODIUM: 143 meq/L (ref 137–147)

## 2013-09-17 LAB — TSH: TSH: 1.509 u[IU]/mL (ref 0.350–4.500)

## 2013-09-17 MED ORDER — SODIUM CHLORIDE 0.45 % IV SOLN: INTRAVENOUS | Status: DC

## 2013-09-17 MED ORDER — PIPERACILLIN-TAZOBACTAM IN DEX 2-0.25 GM/50ML IV SOLN
2.2500 g | Freq: Four times a day (QID) | INTRAVENOUS | Status: DC
  Administered 2013-09-17 – 2013-09-19 (×10): 2.25 g via INTRAVENOUS
  Filled 2013-09-17 (×14): qty 50

## 2013-09-17 MED ORDER — HEPARIN SODIUM (PORCINE) 5000 UNIT/ML IJ SOLN
5000.0000 [IU] | Freq: Three times a day (TID) | INTRAMUSCULAR | Status: DC
  Administered 2013-09-17 – 2013-09-21 (×13): 5000 [IU] via SUBCUTANEOUS
  Filled 2013-09-17 (×17): qty 1

## 2013-09-17 MED ORDER — SODIUM CHLORIDE 0.9 % IV BOLUS (SEPSIS)
500.0000 mL | Freq: Once | INTRAVENOUS | Status: AC
  Administered 2013-09-17: 500 mL via INTRAVENOUS

## 2013-09-17 MED ORDER — SODIUM CHLORIDE 0.9 % IV SOLN
INTRAVENOUS | Status: DC
Start: 2013-09-17 — End: 2013-09-19
  Administered 2013-09-17: 75 mL/h via INTRAVENOUS
  Administered 2013-09-17: 14:00:00 via INTRAVENOUS

## 2013-09-17 MED ORDER — VANCOMYCIN HCL 500 MG IV SOLR
500.0000 mg | INTRAVENOUS | Status: DC
  Administered 2013-09-19: 500 mg via INTRAVENOUS
  Filled 2013-09-17 (×2): qty 500

## 2013-09-17 NOTE — Progress Notes (Signed)
FMTS Attending Daily Note:  [name] MD  [phone] pager  Family Practice pager:  [phone] I have seen and examined this patient and have reviewed their chart. I have discussed this patient with the resident. I agree with the resident's findings, assessment and care plan.  Additionally:  See my separate admit note for details.   Tobey Grim, MD 09/17/2013 12:27 PM

## 2013-09-17 NOTE — ED Provider Notes (Signed)
I performed a history and physical examination of  Kathleen [name] and discussed her management with Dr. Colbert Coyer. I agree with the history, physical, assessment, and plan of care, with the following exceptions: None I was present for the following procedures: None  Time Spent in Critical Care of the patient: None  Time spent in discussions with the patient and family: 15 min  Arley Salamone  Pt comes in with acute worsening of AMS. Seroquel given recently. Has some rigidity, hypothermia, mildly brisk reflexes. + gag, withdraws to pain, but not talking and not following commands. Imaging is normal. Has significant alkalosis - which we are not sure what the etiology is. Will admit.   Derwood Kaplan, MD 09/17/13 [phone]

## 2013-09-17 NOTE — Progress Notes (Signed)
INITIAL NUTRITION ASSESSMENT  DOCUMENTATION CODES Per approved criteria  -Not Applicable   INTERVENTION: Advance diet as medically appropriate, add supplements when able RD to follow for nutrition care plan  NUTRITION DIAGNOSIS: Inadequate oral intake related to inability to eat as evidenced by NPO status  Goal: Pt to meet >/= 90% of their estimated nutrition needs   Monitor:  PO diet advancement & intake, weight, labs, I/O's  Reason for Assessment: Malnutrition Screening Tool Report  80 y.o. female  Admitting Dx: altered mental status  ASSESSMENT: Patient with PMH of dementia, A-fib, CKD and CHF; presented with increasing decline for past several weeks and acute altered mental status; in ED found to be alkalotic on arterial blood gas.  RD spoke with pt's grand-daughter at bedside; reports pt usually consumes 3 meals per day; no trouble chewing or swallowing; no recent unintentional weight loss; does drink Ensure supplements at home; NPO at this time; RD to monitor advancement, add supplements when able.  Nutrition Focused Physical Exam:   Subcutaneous Fat:  Orbital Region: WNL  Upper Arm Region: WNL  Thoracic and Lumbar Region: N/A   Muscle:  Temple Region: moderate depletion  Clavicle Bone Region: moderate depletion  Clavicle and Acromion Bone Region: moderate depletion  Scapular Bone Region: N/A  Dorsal Hand: WNL  Patellar Region: WNL  Anterior Thigh Region: WNL  Posterior Calf Region: WNL  Edema: none   Height: Ht Readings from Last 1 Encounters:  09/17/13 4\' 11"  (1.499 m)    Weight: Wt Readings from Last 1 Encounters:  09/17/13 112 lb 3.4 oz (50.9 kg)    Ideal Body Weight: 98 lb  % Ideal Body Weight: 114%  Wt Readings from Last 10 Encounters:  09/17/13 112 lb 3.4 oz (50.9 kg)  08/07/13 116 lb (52.617 kg)  06/13/13 114 lb (51.71 kg)  06/01/13 110 lb 7.2 oz (50.1 kg)  04/26/13 115 lb (52.164 kg)  03/06/13 115 lb (52.164 kg)  02/26/13 118 lb  (53.524 kg)  02/22/13 113 lb 1.5 oz (51.3 kg)  01/11/13 126 lb 9.6 oz (57.425 kg)  11/21/12 118 lb 9.6 oz (53.797 kg)    Usual Body Weight: 116 lb  % Usual Body Weight: 96%  BMI:  Body mass index is 22.65 kg/(m^2).  Estimated Nutritional Needs: Kcal: 1400-1600 Protein: 65-75 gm Fluid: >/= 1.5 L  Skin: Intact  Diet Order: NPO  EDUCATION NEEDS: -No education needs identified at this time   Intake/Output Summary (Last 24 hours) at 09/17/13 1401 Last data filed at 09/17/13 1000  Gross per 24 hour  Intake  887.5 ml  Output      0 ml  Net  887.5 ml    Labs:   Recent Labs Lab 09/16/13 2201 09/16/13 2205 09/17/13 0544  NA 142 140 143  K 4.9 4.7 4.8  CL 105 102 105  CO2  --  25 26  BUN 62* 44* 39*  CREATININE 1.70* 1.47* 1.50*  CALCIUM  --  10.0 9.7  GLUCOSE 182* 173* 76    CBG (last 3)   Recent Labs  09/16/13 2129  GLUCAP 162*    Scheduled Meds: . heparin  5,000 Units Subcutaneous 3 times per day  . piperacillin-tazobactam (ZOSYN)  IV  2.25 g Intravenous 4 times per day  . [START ON 09/19/2013] vancomycin  500 mg Intravenous Q48H    Continuous Infusions: . sodium chloride 75 mL/hr at 09/17/13 1338    Past Medical History  Diagnosis Date  . Hypertension   .  Arthritis   . Spinal stenosis   . Osteoarthritis   . Anemia   . CHF (congestive heart failure)   . Heart murmur     aortic regurg  . Osteoporosis   . Chronic kidney disease   . Generalized OA 12/16/2011    Significant. Seems to have encompass all joints. Patient is complaining of significant pain but dementia allows patient to be happy.  . Dementia   . SunDown syndrome     Past Surgical History  Procedure Laterality Date  . Nephrectomy Right   . Tonsillectomy      Maureen ChattersKatie Floraine Buechler, RD, LDN Pager #: [phone] After-Hours Pager #: [phone]

## 2013-09-17 NOTE — Progress Notes (Signed)
ANTIBIOTIC CONSULT NOTE - INITIAL  Pharmacy Consult for Vancomycin and Zosyn  Indication: rule out sepsis  No Known Allergies  Patient Measurements: Height: 4' 11.06" (150 cm) Weight: 116 lb 13.5 oz (53 kg) IBW/kg (Calculated) : 43.33  Vital Signs: Temp: 93.5 F (34.2 C) (03/09 2356) Temp src: Rectal (03/09 2356) BP: 155/71 mmHg (03/09 2345) Pulse Rate: 66 (03/09 2345) Intake/Output from previous day:   Intake/Output from this shift:    Labs:  Recent Labs  09/16/13 2201 09/16/13 2205  WBC  --  14.6*  HGB 11.6* 10.8*  PLT  --  144*  CREATININE 1.70* 1.47*   Estimated Creatinine Clearance: 19.7 ml/min (by C-G formula based on Cr of 1.47). No results found for this basename: VANCOTROUGH, VANCOPEAK, VANCORANDOM, GENTTROUGH, GENTPEAK, GENTRANDOM, TOBRATROUGH, TOBRAPEAK, TOBRARND, AMIKACINPEAK, AMIKACINTROU, AMIKACIN,  in the last 72 hours   Microbiology: No results found for this or any previous visit (from the past 720 hour(s)).  Medical History: Past Medical History  Diagnosis Date  . Hypertension   . Arthritis   . Spinal stenosis   . Osteoarthritis   . Anemia   . CHF (congestive heart failure)   . Heart murmur     aortic regurg  . Osteoporosis   . Chronic kidney disease   . Generalized OA 12/16/2011    Significant. Seems to have encompass all joints. Patient is complaining of significant pain but dementia allows patient to be happy.  . Dementia   . SunDown syndrome     Medications:  Norvasc  ASA  Oscal  Doxycycline  Iron  MVI  Seroquel    Assessment: 80 yo female with AMS, possible sepsis, for empiric antibiotics.  Vancomycin 1 g IV given in ED at 2330  Goal of Therapy:  Vancomycin trough level 15-20 mcg/ml  Plan:  Vancomycin 500 mg IV q48h Zosyn 2.25 g IV q6h  Eddie Candle 09/17/2013,1:03 AM

## 2013-09-17 NOTE — H&P (Addendum)
FMTS Attending Admission Note: [name] MD Personal pager:  [phone] FPTS Service Pager:  [phone]  I  have seen and examined this patient, reviewed their chart. I have discussed this patient with the resident. I agree with the resident's findings, assessment and care plan.  Additionally:  80 yo F well known to FPTS who presents with increasing decline for past several weeks and acute altered mental status.  Known history of dementia, a fib, CKD, CHF.  Was living in daughter's house, increased sleeping, mother found her more confused at home over the weekend.  Slept most of the day yesterday and became more confused and somnolent.  Brought to ED, found to be alkalotic on arterial blood gas.    Exam: Gen:  Older female, lying in bed, responds to pain but otherwise non-reactive.   Heart:  Loud murmur throughout, regular rate Lungs:  Clear anteriorly Abdomen:  Palpable stool burden noted Ext:  Thin Skin:  No breakdown Neuro:  Not alert.  Some fasciculations Left hand.  Otherwise uncooperative.   Imp/Plan:  1.  Altered mental status: - unclear etiology - agree broad-spectrum antibiotics - abg improved this am but still not alert. - perhaps obstipation?  Obtain KUB and needs rectal to check for impaction  2.  SIRS criteria: - cultures pending  3.  Disposition: - big issue is goals of care. - will need to touch base with PCP and family regarding this    Tobey Grim, MD 09/17/2013 7:16 AM

## 2013-09-17 NOTE — Progress Notes (Signed)
Utilization Review Completed.  

## 2013-09-17 NOTE — Progress Notes (Signed)
Family Medicine Teaching Service Daily Progress Note Intern Pager: [phone]  Patient name: Kathleen [name] Medical record number: [id number] Date of birth: 1923-05-29 Age: 80 y.o. Gender: female  Primary Care Provider: Denny Levy, MD Consultants: none Code Status: Full  Pt Overview and Major Events to Date:  3/10- ABG with resolved alkalosis after IVF and rewarming  Assessment and Plan: RICHARDEAN [name] is a 80 y.o. female presenting with altered mental status x 2 days, progressive decline over last several weeks. PMH is significant for dementia, history of paroxysmal afib, CKD stage III, and diastolic CHF  # Acute encephalopathy, suspected hypoactive delirium: apparent progressively worsened over past several weeks with minimal sleep the past week, given seroquel 25mg  on early morning of 3/8 (first time receiving for agitation in a long time).  No clear etiology at this point, could be component of dementia or hypoactive delirium exacerbated by recent use of seroquel. Hemorrhagic stroke not seen by CT (though no focal findings on neuro exam other than asymmetric reflexes), infectious etiology (see below). Repeat labs unchanged except for worsening leukocytosis and resolved alkalosis. - continuous tele/pulse ox  - Pending TSH - KUB to evaluate stool burden as potential cause - consider MRI if not improved to rule out ischemic stroke   # SIRS: continues to meet criteria based on tachycardia (now 108-116) and worsened leukocytosis (16.2). No obvious source, CXR, UA, skin exam all normal; only possible source is GI given 1 episode of diarrhea. - vanc (3/9>>) and zosyn (3/9>>)  - pending blood and urine cultures   # Mixed metabolic and respiratory alkalosis, resolved: pH 7.601, bicarb 28.2 and pCO2 28.7. Repeat ABG this morning after rewarming and NS bolus/fluids is now normal with pH 7.445 / pCO2 38.7 / pO2 76.4 / Bicarb 26.2 / acid-base excess 2.5. Unclear exactly what caused this, but  appeared to correct well with fluids and warming blanket - continue to monitor, consider repeat ABG later today or tomorrow if still no explanation for encephalopathy  # Dementia: history of sundowning and delirium, prescribed seroquel 25mg  but only uses occasionally .  - hold seroquel   # CKD: appears to be around baseline of 1.4. Stable this morning at 1.5 - IV hydration   # Diastolic CHF: most recent echo 05/2013 with EF 55-65% does not appear fluid overloaded on exam  - monitor clinically   # Paroxysmal afib: on admission was bradycardic but now tachycardic.  - on telemetry   FEN/GI: NS at 100cc/hr, NS bolus 500cc x1  Prophylaxis: heparin  Disposition: pending clinical improvement  Subjective:  Still non-responsive, appears to have followed command to squeeze hands but she did this last night without command.  Objective: Temp:  [93.5 F (34.2 C)-98.8 F (37.1 C)] 97.9 F (36.6 C) (03/10 0738) Pulse Rate:  [56-116] 108 (03/10 0738) Resp:  [14-30] 30 (03/10 0738) BP: (122-185)/(70-164) 151/73 mmHg (03/10 0738) SpO2:  [95 %-100 %] 95 % (03/10 0738) Weight:  [112 lb 3.4 oz (50.9 kg)-116 lb 13.5 oz (53 kg)] 112 lb 3.4 oz (50.9 kg) (03/10 0406) Physical Exam: General: non-responsive HEENT: Eyes continue to be deviated to left, pupils reactive to light.  Cardiovascular: Tachy but regular rhythm, 2/6 systolic murmur best on RUSB Respiratory: CTAB, does not appear to have increased WOB Abdomen: soft, NTND, normal bowel sounds Extremities: no edema or cyanosis Neuro: GCS 6. Baseline tremor in both UE, UE are moderately rigid bilaterally. 2+ patellar reflex on left, on right elicits jerk reflex or both legs.  Withdraws from pain and [place].  Laboratory:  Recent Labs Lab 09/16/13 2201 09/16/13 2205 09/17/13 0544  WBC  --  14.6* 16.2*  HGB 11.6* 10.8* 9.6*  HCT 34.0* 30.0* 26.7*  PLT  --  144* 130*    Recent Labs Lab 09/16/13 2201 09/16/13 2205 09/17/13 0544  NA  142 140 143  K 4.9 4.7 4.8  CL 105 102 105  CO2  --  25 26  BUN 62* 44* 39*  CREATININE 1.70* 1.47* 1.50*  CALCIUM  --  10.0 9.7  PROT  --  7.8  --   BILITOT  --  0.3  --   ALKPHOS  --  103  --   ALT  --  21  --   AST  --  31  --   GLUCOSE 182* 173* 76   ABG @7am  pH 7.445 / pCO2 38.7 / pO2 76.4 / Bicarb 26.2 / acid-base excess 2.5 Troponin neg x 3  Imaging/Diagnostic Tests: None new  Tawni CarnesAndrew Patrich Heinze, MD 09/17/2013, 8:50 AM PGY-1, [facility] FPTS Intern pager: [phone], text pages welcome

## 2013-09-18 DIAGNOSIS — N179 Acute kidney failure, unspecified: Secondary | ICD-10-CM

## 2013-09-18 DIAGNOSIS — K6389 Other specified diseases of intestine: Secondary | ICD-10-CM

## 2013-09-18 LAB — BASIC METABOLIC PANEL
BUN: 27 mg/dL — ABNORMAL HIGH (ref 6–23)
CALCIUM: 9.7 mg/dL (ref 8.4–10.5)
CO2: 21 mEq/L (ref 19–32)
Chloride: 104 mEq/L (ref 96–112)
Creatinine, Ser: 1.72 mg/dL — ABNORMAL HIGH (ref 0.50–1.10)
GFR calc Af Amer: 29 mL/min — ABNORMAL LOW (ref >90)
GFR, EST NON AFRICAN AMERICAN: 25 mL/min — AB (ref >90)
Glucose, Bld: 65 mg/dL — ABNORMAL LOW (ref 70–99)
Potassium: 4.4 mEq/L (ref 3.7–5.3)
SODIUM: 143 meq/L (ref 137–147)

## 2013-09-18 LAB — URINE CULTURE
Colony Count: NO GROWTH
Culture: NO GROWTH
Special Requests: NORMAL

## 2013-09-18 LAB — CBC
HCT: 28.7 % — ABNORMAL LOW (ref 36.0–46.0)
Hemoglobin: 10.5 g/dL — ABNORMAL LOW (ref 12.0–15.0)
MCH: 30.7 pg (ref 26.0–34.0)
MCHC: 36.6 g/dL — ABNORMAL HIGH (ref 30.0–36.0)
MCV: 83.9 fL (ref 78.0–100.0)
PLATELETS: 139 10*3/uL — AB (ref 150–400)
RBC: 3.42 MIL/uL — AB (ref 3.87–5.11)
RDW: 16.6 % — AB (ref 11.5–15.5)
WBC: 11.5 10*3/uL — AB (ref 4.0–10.5)

## 2013-09-18 MED ORDER — HYDRALAZINE HCL 20 MG/ML IJ SOLN
10.0000 mg | Freq: Four times a day (QID) | INTRAMUSCULAR | Status: DC | PRN
  Administered 2013-09-18 – 2013-09-20 (×2): 10 mg via INTRAVENOUS
  Filled 2013-09-18 (×3): qty 1

## 2013-09-18 NOTE — Progress Notes (Signed)
Assist X 2 oOB to chair for 5 hours. HR 120 max when OOB. Pt advanced to renal diet with granddaughter assisting with  Feeding. Pt prepared for transfer to [facility] at 15:00.

## 2013-09-18 NOTE — Progress Notes (Signed)
Family Medicine Teaching Service Daily Progress Note Intern Pager: [phone]  Patient name: Kathleen [name] Medical record number: [id number] Date of birth: [birth year] Age: 80 y.o. Gender: female  Primary Care Provider: Denny LevySara Neal, MD Consultants: none Code Status: Full  Pt Overview and Major Events to Date:  3/10- ABG with resolved alkalosis after IVF and rewarming 3/11- Alert and talking, urine cx negative  Assessment and Plan: Kathleen [name] is a 80 y.o. female presenting with altered mental status x 2 days, progressive decline over last several weeks. PMH is significant for dementia, history of paroxysmal afib, CKD stage III, and diastolic CHF  # Acute encephalopathy, suspected hypoactive delirium: improved mentation this morning, converses in short sentences and appears to be back to baseline. KUB yesterday negative for bowel obstruction. TSH normal at 1.509.  - continuous tele/pulse ox  - family meeting to discuss goals of care  # SIRS: afebrile and not hypothermic since admission, WBC decreased from 16.2 to 11.5 today. No obvious source of infection; urine culture no growth, blood cx NGTD - vanc (3/9>>) and zosyn (3/9>>)  - pending blood culture  # Mixed metabolic and respiratory alkalosis, resolved: pH 7.601, bicarb 28.2 and pCO2 28.7. Repeat ABG this morning after rewarming and NS bolus/fluids is now normal with pH 7.445 / pCO2 38.7 / pO2 76.4 / Bicarb 26.2 / acid-base excess 2.5. Unclear exactly what caused this, but appeared to correct well with fluids and warming blanket - continue to monitor, consider repeat ABG later today or tomorrow if still no explanation for encephalopathy  # Dementia: history of sundowning and delirium, prescribed seroquel 25mg  but only uses occasionally .  - hold seroquel   # AKI on CKD: appears to be around baseline of 1.4. Up to Cr 1.72 this morning - IV hydration   # Diastolic CHF: most recent echo 05/2013 with EF 55-65% does not appear  fluid overloaded on exam  - monitor clinically   # Paroxysmal afib: on admission was bradycardic, tachycardic over past 24 hours.  - on telemetry   FEN/GI: NS at 75cc/hr Prophylaxis: heparin  Disposition: pending clinical improvement, out of SDU today  Subjective:  Awake and converses this morning. Says she has no complaints, denies pain. States she does not feel like eating.  Objective: Temp:  [98 F (36.7 C)-98.4 F (36.9 C)] 98.2 F (36.8 C) (03/11 0448) Pulse Rate:  [96-131] 126 (03/11 0605) Resp:  [17-31] 22 (03/11 0605) BP: (122-187)/(52-95) 140/76 mmHg (03/11 0605) SpO2:  [95 %-99 %] 95 % (03/11 0605) Weight:  [110 lb 3.7 oz (50 kg)] 110 lb 3.7 oz (50 kg) (03/11 0448) Physical Exam: General: NAD, follows commands HEENT: PERRL, EOMI Cardiovascular: Tachy but regular rhythm, 2/6 systolic murmur best on RUSB Respiratory: CTAB, does not appear to have increased WOB Abdomen: soft, NTND, normal bowel sounds Extremities: no edema or cyanosis Neuro: alert and oriented to person/place but not time (states 1988), follows commands. Grip strength 4+/5 bilaterally. 2+ patellar reflexes bilaterally.  Laboratory:  Recent Labs Lab 09/16/13 2205 09/17/13 0544 09/18/13 0519  WBC 14.6* 16.2* 11.5*  HGB 10.8* 9.6* 10.5*  HCT 30.0* 26.7* 28.7*  PLT 144* 130* 139*    Recent Labs Lab 09/16/13 2205 09/17/13 0544 09/18/13 0519  NA 140 143 143  K 4.7 4.8 4.4  CL 102 105 104  CO2 25 26 21   BUN 44* 39* 27*  CREATININE 1.47* 1.50* 1.72*  CALCIUM 10.0 9.7 9.7  PROT 7.8  --   --  BILITOT 0.3  --   --   ALKPHOS 103  --   --   ALT 21  --   --   AST 31  --   --   GLUCOSE 173* 76 65*    Imaging/Diagnostic Tests: None new  Tawni Carnes, MD 09/18/2013, 8:51 AM PGY-1, [facility] Family Medicine FPTS Intern pager: [phone], text pages welcome

## 2013-09-18 NOTE — Progress Notes (Signed)
Patient trasfered from [facility] to 5W07 via bed; alert and oriented x 3; no complaints of pain; IV saline  in LFA  running NS@75cc /hr; skin intact. Orient patient to room and unit;gave patient care guide; instructed how to use the call bell and  fall risk precautions. Will continue to monitor the patient.

## 2013-09-18 NOTE — Progress Notes (Signed)
Pt. Placed on telemetry box 10 with continuous pulse ox.  Informed central telemetry.  Will continue to monitor. Forbes Cellar, RN

## 2013-09-18 NOTE — Progress Notes (Signed)
FMTS Attending Daily Note:  [name] MD  [phone] pager  Family Practice pager:  [phone] I have seen and examined this patient and have reviewed their chart. I have discussed this patient with the resident. I agree with the resident's findings, assessment and care plan.  Additionally:  - More awake this AM.  Mentation improved about 5 AM per nursing.  - Confused, pleasantly.  Knows she is in the hospital but thinks there are people hiding in her bathroom.   - Unclear etiology for encephalopathy.  Wonder if overdose of Seroquel, need to talk with family to see if they increased this medicine suddenly.  - Tachycardic today.  Ekg to examine.    Tobey Grim, MD 09/18/2013 9:14 AM

## 2013-09-19 LAB — CBC
HCT: 25.1 % — ABNORMAL LOW (ref 36.0–46.0)
HEMOGLOBIN: 9.1 g/dL — AB (ref 12.0–15.0)
MCH: 30.7 pg (ref 26.0–34.0)
MCHC: 36.3 g/dL — ABNORMAL HIGH (ref 30.0–36.0)
MCV: 84.8 fL (ref 78.0–100.0)
Platelets: 125 10*3/uL — ABNORMAL LOW (ref 150–400)
RBC: 2.96 MIL/uL — ABNORMAL LOW (ref 3.87–5.11)
RDW: 16.7 % — AB (ref 11.5–15.5)
WBC: 8.6 10*3/uL (ref 4.0–10.5)

## 2013-09-19 LAB — BASIC METABOLIC PANEL
BUN: 31 mg/dL — ABNORMAL HIGH (ref 6–23)
CO2: 25 mEq/L (ref 19–32)
Calcium: 8.7 mg/dL (ref 8.4–10.5)
Chloride: 108 mEq/L (ref 96–112)
Creatinine, Ser: 1.7 mg/dL — ABNORMAL HIGH (ref 0.50–1.10)
GFR calc Af Amer: 30 mL/min — ABNORMAL LOW (ref >90)
GFR, EST NON AFRICAN AMERICAN: 26 mL/min — AB (ref >90)
Glucose, Bld: 96 mg/dL (ref 70–99)
Potassium: 3.5 mEq/L — ABNORMAL LOW (ref 3.7–5.3)
SODIUM: 148 meq/L — AB (ref 137–147)

## 2013-09-19 NOTE — Progress Notes (Signed)
FMTS Attending Daily Note:  Kathleen Don MD  [phone] pager  Family Practice pager:  [phone] I have seen and examined this patient and have reviewed their chart. I have discussed this patient with the resident. I agree with the resident's findings, assessment and care plan.   Tobey Grim, MD 09/19/2013 2:36 PM

## 2013-09-19 NOTE — Evaluation (Signed)
Physical Therapy Evaluation Patient Details Name: Kathleen [name] MRN: [id number] DOB: [birth year] Today's Date: 09/19/2013 Time: 1135-1150 PT Time Calculation (min): 15 min  PT Assessment / Plan / Recommendation History of Present Illness  Kathleen [name] is a 80 y.o. female presenting with altered mental status x 2 days, progressive decline over last several weeks. PMH is significant for dementia, history of paroxysmal afib, CKD stage III, and diastolic CHF  Clinical Impression  **Pt admitted with AMS*. Pt currently with functional limitations due to the deficits listed below (see PT Problem List).  Pt will benefit from skilled PT to increase their independence and safety with mobility to allow discharge to the venue listed below.   *    PT Assessment  Patient needs continued PT services    Follow Up Recommendations  Home health PT    Does the patient have the potential to tolerate intense rehabilitation      Barriers to Discharge        Equipment Recommendations  None recommended by PT    Recommendations for Other Services     Frequency Min 3X/week    Precautions / Restrictions Precautions Precautions: Fall Restrictions Weight Bearing Restrictions: No   Pertinent Vitals/Pain **pt denied pain HR 78*      Mobility  Bed Mobility Overal bed mobility: Needs Assistance Bed Mobility: Supine to Sit Supine to sit: Mod assist Transfers Overall transfer level: Needs assistance Transfers: Stand Pivot Transfers Stand pivot transfers: Max assist General transfer comment: pt 50%, assist to pivot and for balance    Exercises     PT Diagnosis: Generalized weakness  PT Problem List: Decreased strength;Decreased activity tolerance;Decreased balance;Decreased mobility PT Treatment Interventions: Functional mobility training;Patient/family education;Therapeutic exercise;Therapeutic activities     PT Goals(Current goals can be found in the care plan section) Acute  Rehab PT Goals Patient Stated Goal: return to prior level of function PT Goal Formulation: With patient Time For Goal Achievement: 10/03/13 Potential to Achieve Goals: Fair  Visit Information  Last PT Received On: 09/19/13 Assistance Needed: +1 History of Present Illness: Kathleen [name] is a 80 y.o. female presenting with altered mental status x 2 days, progressive decline over last several weeks. PMH is significant for dementia, history of paroxysmal afib, CKD stage III, and diastolic CHF       Prior Functioning  Home Living Family/patient expects to be discharged to:: Private residence Living Arrangements: Children Available Help at Discharge: Family;Personal care attendant;Available 24 hours/day Home Access: Level entry Home Layout: One level Home Equipment: Wheelchair - manual;Shower seat Prior Function Level of Independence: Needs assistance Gait / Transfers Assistance Needed: uses WC, assist to transfer to [facility] ADL's / Homemaking Assistance Needed: A with bathing and dressing Comments: pt poor historian secondary to decr cognition; PLOF recorded from pt's aide Communication Communication: No difficulties    Cognition  Cognition Arousal/Alertness: Awake/alert Behavior During Therapy: WFL for tasks assessed/performed Overall Cognitive Status: History of cognitive impairments - at baseline    Extremity/Trunk Assessment Upper Extremity Assessment Upper Extremity Assessment: Generalized weakness Lower Extremity Assessment Lower Extremity Assessment: Generalized weakness (knee ext -3/5 B, ankle DF/PF -2/5) Cervical / Trunk Assessment Cervical / Trunk Assessment: Kyphotic   Balance Balance Overall balance assessment: Needs assistance Sitting-balance support: Bilateral upper extremity supported;Feet supported Sitting balance-Leahy Scale: Poor Standing balance-Leahy Scale: Zero  End of Session PT - End of Session Equipment Utilized During Treatment: Gait belt Activity  Tolerance: Patient tolerated treatment well Patient left: in chair;with call bell/phone within reach;with  family/visitor present Nurse Communication: Mobility status  GP     Ralene BatheUhlenberg, Maira Christon Kistler 09/19/2013, 12:03 PM [phone]

## 2013-09-19 NOTE — Progress Notes (Signed)
Family Medicine Teaching Service Daily Progress Note Intern Pager: [phone]  Patient name: Kathleen [name] Medical record number: [id number] Date of birth: 1922/11/09 Age: 80 y.o. Gender: female  Primary Care Provider: Denny Levy, MD Consultants: none Code Status: Full  Pt Overview and Major Events to Date:  3/10- ABG with resolved alkalosis after IVF and rewarming 3/11- Alert and talking, urine cx negative  Assessment and Plan: [place] KREKEL is a 80 y.o. female presenting with altered mental status x 2 days, progressive decline over last several weeks. PMH is significant for dementia, history of paroxysmal afib, CKD stage III, and diastolic CHF  # Acute encephalopathy, suspected hypoactive delirium: appears to have mostly resolved though patient is still disoriented (at baseline?).  - discontinue telemetry - consult to palliative care for family meeting to discuss goals of care  # SIRS: afebrile and not hypothermic since admission, WBC decreased from 16.2 to 11.5 today. No obvious source of infection; urine culture no growth, blood cx NGTD - vanc (3/9>>) and zosyn (3/9>>)  - pending blood culture, once negative x 48hrs will d/c antibiotics  # Mixed metabolic and respiratory alkalosis, resolved: pH 7.6 on admission and resolved to 7.445 by the morning. Unclear etiology. - if worsens consider repeat ABG.  # Dementia: history of sundowning and delirium, prescribed seroquel 25mg  but only uses occasionally .  - hold seroquel   # AKI on CKD: appears to be around baseline of 1.4.  - IV hydration   # Diastolic CHF: most recent echo 05/2013 with EF 55-65% does not appear fluid overloaded on exam  - monitor clinically   # Paroxysmal afib: on admission was bradycardic, tachycardic over past 24 hours.  - on telemetry  - EKG yesterday with no significant change.  FEN/GI: NS at 75cc/hr Prophylaxis: heparin  Disposition: pending clinical improvement, off  antibiotics  Subjective:  More awake this morning but confused; states multiple times she is at home despite correcting her. Complains of pain only in lower legs.   Objective: Temp:  [97.4 F (36.3 C)-98.9 F (37.2 C)] 97.4 F (36.3 C) (03/12 0500) Pulse Rate:  [72-123] 72 (03/12 0500) Resp:  [18-27] 18 (03/12 0500) BP: (121-167)/(65-92) 142/65 mmHg (03/12 0500) SpO2:  [93 %-98 %] 98 % (03/12 0500) Physical Exam: General: NAD, follows commands HEENT: PERRL, EOMI Cardiovascular: RRR, 3/6 systolic murmur best on RUSB Respiratory: CTAB, does not appear to have increased WOB Abdomen: soft, NTND, normal bowel sounds Extremities: no edema or cyanosis Neuro: alert, not oriented to place/time. No focal deficits.  Laboratory:  Recent Labs Lab 09/16/13 2205 09/17/13 0544 09/18/13 0519  WBC 14.6* 16.2* 11.5*  HGB 10.8* 9.6* 10.5*  HCT 30.0* 26.7* 28.7*  PLT 144* 130* 139*    Recent Labs Lab 09/16/13 2205 09/17/13 0544 09/18/13 0519  NA 140 143 143  K 4.7 4.8 4.4  CL 102 105 104  CO2 25 26 21   BUN 44* 39* 27*  CREATININE 1.47* 1.50* 1.72*  CALCIUM 10.0 9.7 9.7  PROT 7.8  --   --   BILITOT 0.3  --   --   ALKPHOS 103  --   --   ALT 21  --   --   AST 31  --   --   GLUCOSE 173* 76 65*    Imaging/Diagnostic Tests: None new  Tawni Carnes, MD 09/19/2013, 8:03 AM PGY-1, [facility]: [facility] Family Medicine FPTS Intern pager: [phone], text pages welcome

## 2013-09-19 NOTE — Evaluation (Deleted)
Physical Therapy Evaluation Patient Details Name: Kathleen [name] MRN: [id number] DOB: 1923/06/09 Today's Date: 09/19/2013 Time: 1135-1150 PT Time Calculation (min): 15 min  PT Assessment / Plan / Recommendation History of Present Illness  Kathleen [name] is a 80 y.o. female presenting with altered mental status x 2 days, progressive decline over last several weeks. PMH is significant for dementia, history of paroxysmal afib, CKD stage III, and diastolic CHF  Clinical Impression  *Pt admitted with AMS*. Pt currently with functional limitations due to the deficits listed below (see PT Problem List).  Pt will benefit from skilled PT to increase their independence and safety with mobility to allow discharge to the venue listed below.   **    PT Assessment  Patient needs continued PT services    Follow Up Recommendations  Home health PT    Does the patient have the potential to tolerate intense rehabilitation      Barriers to Discharge        Equipment Recommendations  None recommended by PT    Recommendations for Other Services     Frequency Min 3X/week    Precautions / Restrictions Precautions Precautions: Fall Restrictions Weight Bearing Restrictions: No   Pertinent Vitals/Pain **Pt denied pain.  HR 78 at rest*      Mobility       Exercises     PT Diagnosis: Generalized weakness  PT Problem List: Decreased strength;Decreased activity tolerance;Decreased balance;Decreased mobility PT Treatment Interventions: Functional mobility training;Patient/family education;Therapeutic exercise;Therapeutic activities     PT Goals(Current goals can be found in the care plan section) Acute Rehab PT Goals Patient Stated Goal: return to prior level of function PT Goal Formulation: With patient Time For Goal Achievement: 10/03/13 Potential to Achieve Goals: Fair  Visit Information  Last PT Received On: 09/19/13 Assistance Needed: +1 History of Present Illness: Kathleen [name] is a 80 y.o. female presenting with altered mental status x 2 days, progressive decline over last several weeks. PMH is significant for dementia, history of paroxysmal afib, CKD stage III, and diastolic CHF       Prior Functioning  Home Living Family/patient expects to be discharged to:: Private residence Living Arrangements: Children Available Help at Discharge: Family;Personal care attendant;Available 24 hours/day Home Access: Level entry Home Layout: One level Home Equipment: Wheelchair - manual;Shower seat Prior Function Level of Independence: Needs assistance Gait / Transfers Assistance Needed: uses WC, assist to transfer to [facility] ADL's / Homemaking Assistance Needed: A with bathing and dressing Comments: pt poor historian secondary to decr cognition; PLOF recorded from pt's aide Communication Communication: No difficulties    Cognition  Cognition Arousal/Alertness: Awake/alert Behavior During Therapy: WFL for tasks assessed/performed Overall Cognitive Status: History of cognitive impairments - at baseline    Extremity/Trunk Assessment Upper Extremity Assessment Upper Extremity Assessment: Generalized weakness Lower Extremity Assessment Lower Extremity Assessment: Generalized weakness (knee ext -3/5 B, ankle DF/PF -2/5) Cervical / Trunk Assessment Cervical / Trunk Assessment: Kyphotic   Balance Balance Overall balance assessment: Needs assistance Sitting-balance support: Bilateral upper extremity supported;Feet supported Sitting balance-Leahy Scale: Poor Standing balance-Leahy Scale: Zero  End of Session PT - End of Session Equipment Utilized During Treatment: Gait belt Activity Tolerance: Patient tolerated treatment well Patient left: in chair;with call bell/phone within reach;with family/visitor present Nurse Communication: Mobility status  GP     Ralene Bathe Kistler 09/19/2013, 12:01 PM [phone]

## 2013-09-20 MED ORDER — POTASSIUM CHLORIDE CRYS ER 20 MEQ PO TBCR: 30.0000 meq | EXTENDED_RELEASE_TABLET | Freq: Two times a day (BID) | ORAL | Status: DC

## 2013-09-20 MED ORDER — POTASSIUM CHLORIDE CRYS ER 20 MEQ PO TBCR
30.0000 meq | EXTENDED_RELEASE_TABLET | Freq: Once | ORAL | Status: AC
  Administered 2013-09-20: 30 meq via ORAL
  Filled 2013-09-20: qty 1

## 2013-09-20 NOTE — Progress Notes (Signed)
FMTS Attending Daily Note:  [name] MD  [phone] pager  Family Practice pager:  [phone] I have seen and examined this patient and have reviewed their chart. I have discussed this patient with the resident. I agree with the resident's findings, assessment and care plan.  Additionally:  Mental status improved to baseline.   Complaining of some mild thoracic back pain. Plan is for pallative meeting prior to DC.  She remains full code currently with aggressive care.  Family has agreed to palliative meeting to "ensure everyone's on the same page" in case she decompensates.   Family plans on taking her back home at DC.    Tobey Grim, MD 09/20/2013 12:11 PM

## 2013-09-20 NOTE — Progress Notes (Signed)
Family Medicine Teaching Service Daily Progress Note Intern Pager: [phone]  Patient name: Kathleen [name] Medical record number: [id number] Date of birth: Mar 17, 1923 Age: 80 y.o. Gender: female  Primary Care Provider: Denny Levy, MD Consultants: none Code Status: Full  Pt Overview and Major Events to Date:  3/10- ABG with resolved alkalosis after IVF and rewarming 3/11- Alert and talking, urine cx negative 3/12- antibiotics discontinued  Assessment and Plan: Kathleen [name] is a 80 y.o. female presenting with altered mental status x 2 days, progressive decline over last several weeks. PMH is significant for dementia, history of paroxysmal afib, CKD stage III, and diastolic CHF  # Acute encephalopathy, suspected hypoactive delirium: appears to have mostly resolved though patient is still disoriented (at baseline?).  - discontinue telemetry - consult to palliative care for family meeting to discuss goals of care  # SIRS: afebrile and not hypothermic since admission, WBC decreased from 16.2>>8.6 yesterday. No obvious source of infection; urine culture no growth, blood cx NGTD - vanc (3/9>>3/12) and zosyn (3/9>>3/12)  - pending blood culture, NGTD.  # Mixed metabolic and respiratory alkalosis, resolved: pH 7.6 on admission and resolved to 7.445 by the morning. Unclear etiology. - if worsens consider repeat ABG.  # Dementia: history of sundowning and delirium, prescribed seroquel 25mg  but only uses occasionally .  - hold seroquel   # AKI on CKD: appears to be around baseline of 1.4.  - IV hydration   # Diastolic CHF: most recent echo 05/2013 with EF 55-65% does not appear fluid overloaded on exam  - monitor clinically   # Paroxysmal afib: on admission was bradycardic, tachycardic over past 24 hours.  - on telemetry  - EKG yesterday with no significant change.  FEN/GI: NS at 75cc/hr Prophylaxis: heparin  Disposition: pending family meeting for GOC  Subjective:   Complains of back and right side pain, somewhat nonsensical with her speech this morning.  Objective: Temp:  [97.5 F (36.4 C)-97.6 F (36.4 C)] 97.5 F (36.4 C) (03/13 0353) Pulse Rate:  [68-85] 85 (03/13 0353) Resp:  [16-18] 18 (03/13 0353) BP: (122-130)/(60-79) 125/79 mmHg (03/13 0353) SpO2:  [96 %-100 %] 100 % (03/13 0353) Physical Exam: General: NAD, follows commands HEENT: PERRL, EOMI Cardiovascular: RRR, 3/6 systolic murmur best on RUSB Respiratory: CTAB, does not appear to have increased WOB Back: no sacral ulcer/erythema noted. Patient incontinent of urine Abdomen: soft, NTND, normal bowel sounds Extremities: no edema or cyanosis Neuro: alert, oriented to person/place but not time. No focal deficits.  Laboratory:  Recent Labs Lab 09/17/13 0544 09/18/13 0519 09/19/13 0935  WBC 16.2* 11.5* 8.6  HGB 9.6* 10.5* 9.1*  HCT 26.7* 28.7* 25.1*  PLT 130* 139* 125*    Recent Labs Lab 09/16/13 2205 09/17/13 0544 09/18/13 0519 09/19/13 0935  NA 140 143 143 148*  K 4.7 4.8 4.4 3.5*  CL 102 105 104 108  CO2 25 26 21 25   BUN 44* 39* 27* 31*  CREATININE 1.47* 1.50* 1.72* 1.70*  CALCIUM 10.0 9.7 9.7 8.7  PROT 7.8  --   --   --   BILITOT 0.3  --   --   --   ALKPHOS 103  --   --   --   ALT 21  --   --   --   AST 31  --   --   --   GLUCOSE 173* 76 65* 96    Imaging/Diagnostic Tests: None new  Tawni Carnes, MD 09/20/2013, 7:31 AM PGY-1,  [facility] Intern pager: [phone], text pages welcome

## 2013-09-20 NOTE — Care Management Note (Signed)
    Page 1 of 1   09/20/2013     4:03:25 PM   CARE MANAGEMENT NOTE 09/20/2013  Patient:  Kathleen [name], Kathleen [name]   Account Number:  [id number]  Date Initiated:  09/20/2013  Documentation initiated by:  Letha Cape  Subjective/Objective Assessment:   dx acute encephalopathy  admit- lives with daughter.     Action/Plan:   pt eval-rec hhpt.  Palliative consult   Anticipated DC Date:  09/21/2013   Anticipated DC Plan:  HOME W HOME HEALTH SERVICES      DC Planning Services  CM consult      Choice offered to / List presented to:             Status of service:  In process, will continue to follow Medicare Important Message given?   (If response is "NO", the following Medicare IM given date fields will be blank) Date Medicare IM given:   Date Additional Medicare IM given:    Discharge Disposition:    Per UR Regulation:    If discussed at Long Length of Stay Meetings, dates discussed:    Comments:  09/20/13 1601 Letha Cape RN, BSN [phone] patient lives with daughter, family has agreed to a palliative consult, but they plan to take patient back home per MD notes.  NCM will continue to follow for dc needs.

## 2013-09-20 NOTE — Discharge Summary (Signed)
[facility] Discharge Summary  Patient name: Kathleen [name] Medical record number: [id number] Date of birth: Nov 18, 1922 Age: 80 y.o. Gender: female Date of Admission: 09/16/2013  Date of Discharge: 09/21/2013 Admitting Physician: Alveda Reasons, MD  Primary Care Provider: Dorcas Mcmurray, MD Consultants: none  Indication for Hospitalization: Acute encephalopathy/AMS  Discharge Diagnoses/Problem List:  Acute encephalopathy secondary to hypoactive delirium vs medication use, resolved SIRS, resolved Mixed metabolic and respiratory alkalosis, resolved Dementia CKD stage 3B History of paroxysmal Afib  Disposition: home  Discharge Condition: stable, improved  Brief Hospital Course:  Kathleen [name] is a 80 y.o. female that was admitted for acute encephalopathy and unresponsiveness, meeting SIRS criteria. PMH significant for dementia, CKD, paroxysmal afib. Patient was minimally responsive on admission, GCS 6, vitals significant for hypothermia to 95.20F, normal heart rate and BP. Met SIRS criteria based on hypothermia and leukocytosis (WBC 14.6), blood and urine cultures drawn and started on vancomycin and zosyn. CT head and CXR were both normal. ABG was significant for mixed respiratory and metabolic alkalosis (pH 7.6).  Repeat ABG in the morning after rewarming was resolved (pH 7.44). She was monitored with suspected hypoactive delirium or medication use (received $RemoveBefor'25mg'SNnaHnYUpYFs$  seroquel the day prior). Her mental status improved by the afternoon of hospital day 2, becoming more alert and talking but remained disoriented to time/place/situation. Antibiotics were continued until blood cultures were negative x 48 hours. Palliative care was consulted to have a goals of care discussion with the family but this was unable to be done while admitted.  Issues for Follow Up:  1. Goals of care meeting  Significant Procedures: none  Significant Labs and Imaging:   Recent  Labs Lab 09/17/13 0544 09/18/13 0519 09/19/13 0935  WBC 16.2* 11.5* 8.6  HGB 9.6* 10.5* 9.1*  HCT 26.7* 28.7* 25.1*  PLT 130* 139* 125*    Recent Labs Lab 09/16/13 2201 09/16/13 2205 09/17/13 0544 09/18/13 0519 09/19/13 0935  NA 142 140 143 143 148*  K 4.9 4.7 4.8 4.4 3.5*  CL 105 102 105 104 108  CO2  --  $R'25 26 21 25  'uH$ GLUCOSE 182* 173* 76 65* 96  BUN 62* 44* 39* 27* 31*  CREATININE 1.70* 1.47* 1.50* 1.72* 1.70*  CALCIUM  --  10.0 9.7 9.7 8.7  ALKPHOS  --  103  --   --   --   AST  --  31  --   --   --   ALT  --  21  --   --   --   ALBUMIN  --  3.1*  --   --   --     Dg Abd 1 View  09/17/2013   CLINICAL DATA:  Possible stool burden.  Abdominal pain.  EXAM: ABDOMEN - 1 VIEW  COMPARISON:  Pelvis radiograph 09/16/2013 M 08/07/2013  FINDINGS: There is a large oval calcified mass in the pelvis that is unchanged. This measures up to 6.8 cm, and is likely a calcified uterine fibroid. The bowel gas pattern is nonobstructive. There is no significant stool burden in the colon or rectum. No abdominal mass effect. There is stable mild osteoarthritis of the left hip. There are chronic moderate degenerative changes of the right hip. Patchy sclerosis and areas of lucency of the right femoral head are again noted, for which a avascular necrosis cannot be excluded.  IMPRESSION: 1. Nonobstructive bowel gas pattern.  No significant stool burden. 2. Stable large calcified pelvic mass, likely calcified uterine  fibroid. 3. Stable chronic degenerative changes of the hips. Cannot exclude avascular necrosis of the right femoral head.   Electronically Signed   By: Curlene Dolphin M.D.   On: 09/17/2013 11:19   Ct Head Wo Contrast  09/16/2013   CLINICAL DATA:  Altered mental status.  EXAM: CT HEAD WITHOUT CONTRAST  TECHNIQUE: Contiguous axial images were obtained from the base of the skull through the vertex without intravenous contrast.  COMPARISON:  CT scan of May 30, 2013.  FINDINGS: Bony calvarium  appears intact. Mild diffuse cortical atrophy is noted. 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 No mass effect or midline shift is noted. Ventricular size is within normal limits. There is no evidence of mass lesion, hemorrhage or acute infarction. Old lacunar infarction is noted in left basal ganglia.  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 Mild diffuse cortical atrophy. No acute intracranial abnormality seen.   Electronically Signed   By: Sabino Dick M.D.   On: 09/16/2013 22:41   Dg Pelvis Portable  09/16/2013   CLINICAL DATA:  The patient fell tonight.  Altered mental status.  EXAM: PORTABLE PELVIS 1-2 VIEWS  COMPARISON:  08/07/2013  FINDINGS: There is a large calcified mass in the pelvis, probably representing a fibroid, unchanged. There mild left and moderate right arthritic changes of the hips. No acute osseous abnormality of the pelvis.  IMPRESSION: No acute abnormalities.   Electronically Signed   By: Rozetta Nunnery M.D.   On: 09/16/2013 22:46   Dg Chest Portable 1 View  09/16/2013   CLINICAL DATA:  Altered mental status.  The patient fell tonight.  EXAM: PORTABLE CHEST - 1 VIEW  COMPARISON:  08/17/2013  FINDINGS: Heart size and pulmonary vascularity are normal. Minimal atelectasis at the right base. There severe chronic changes of both shoulders. No acute osseous abnormalities.  IMPRESSION: Minimal atelectasis at the right lung base.   Electronically Signed   By: Rozetta Nunnery M.D.   On: 09/16/2013 22:41   Dg Shoulder Right Port  09/16/2013   CLINICAL DATA:  Fall.  EXAM: PORTABLE RIGHT SHOULDER - 2+ VIEW  COMPARISON:  Chest radiograph of May 30, 2013; shoulder radiograph of May 28, 2011.  FINDINGS: Deformity of the right proximal humeral head is again noted which is unchanged compared to prior exam. Deformity of glenoid fossa is also noted which is unchanged consistent with degenerative joint disease. Visualized ribs  appear normal. No acute fracture or dislocation is noted. Mild inferior subluxation of the proximal right humeral head is noted.  IMPRESSION: No acute fracture is noted. Mild inferior subluxation of proximal right humeral head is noted most likely due to ligamentous laxity. Severe deformity of the glenoid fossa of proximal right humeral head is noted secondary to degenerative joint disease.   Electronically Signed   By: Sabino Dick M.D.   On: 09/16/2013 23:57    Results/Tests Pending at Time of Discharge: blood culture (NGTD)  Discharge Medications:    Medication List    STOP taking these medications       doxycycline 100 MG capsule  Commonly known as:  VIBRAMYCIN      TAKE these medications       amLODipine 5 MG tablet  Commonly known as:  NORVASC  Take 5 mg by mouth daily.     aspirin 81 MG tablet  Take 1 tablet (81 mg total) by mouth daily.     calcium-vitamin D 500-200 MG-UNIT  per tablet  Commonly known as:  OSCAL WITH D  Take 1 tablet by mouth daily.     FERROUSUL 325 (65 FE) MG tablet  Generic drug:  ferrous sulfate  Take 325 mg by mouth daily.     QUEtiapine 25 MG tablet  Commonly known as:  SEROQUEL  Take 1 tablet (25 mg total) by mouth at bedtime.     SENIOR MULTIVITAMIN PLUS Tabs  Take 1 tablet by mouth 1 dose over 24 hours.        Discharge Instructions: Please refer to Patient Instructions section of EMR for full details.  Patient was counseled important signs and symptoms that should prompt return to medical care, changes in medications, dietary instructions, activity restrictions, and follow up appointments.   Follow-Up Appointments: Follow-up Information   Schedule an appointment as soon as possible for a visit with Dorcas Mcmurray, MD.   Specialties:  Family Medicine, Sports Medicine   Contact information:   [address] [place] [address] [phone]       Tawanna Sat, MD 09/23/2013, 7:51 AM PGY-1, [facility]

## 2013-09-21 DIAGNOSIS — M159 Polyosteoarthritis, unspecified: Secondary | ICD-10-CM

## 2013-09-21 MED ORDER — FREE WATER: 200.0000 mL | Status: DC

## 2013-09-21 MED ORDER — FREE WATER
200.0000 mL | Status: AC
  Administered 2013-09-21: 200 mL via ORAL

## 2013-09-21 NOTE — Progress Notes (Signed)
Family Medicine Teaching Service Daily Progress Note Intern Pager: [phone]  Patient name: Kathleen [name] Medical record number: [id number] Date of birth: 1923-06-05 Age: 80 y.o. Gender: female  Primary Care Provider: Denny LevySara Neal, MD Consultants: none Code Status: Full  Pt Overview and Major Events to Date:  3/10- ABG with resolved alkalosis after IVF and rewarming 3/11- Alert and talking, urine cx negative 3/12- antibiotics discontinued 3/14- Palliative care consult for GOC  Assessment and Plan: Kathleen [name] is a 80 y.o. female presenting with altered mental status x 2 days, progressive decline over last several weeks. PMH is significant for dementia, history of paroxysmal afib, CKD stage III, and diastolic CHF  # Acute encephalopathy, suspected hypoactive delirium: appears to have resolved back to patient's baseline dementia.  - consult to palliative care for family meeting to discuss goals of care, family agreeable to this.  # SIRS: afebrile and not hypothermic since admission, WBC decreased from 16.2>>8.6, no longer trending. No obvious source of infection; CXR negative, urine culture no growth, blood cx NGTD - vanc (3/9>>3/12) and zosyn (3/9>>3/12)  - pending blood culture, NGTD.  # Mixed metabolic and respiratory alkalosis, resolved: pH 7.6 on admission and resolved to 7.445 by the morning. Unclear etiology. - if worsens consider repeat ABG.  # Dementia: history of sundowning and delirium, prescribed seroquel 25mg  but only uses occasionally .  - hold seroquel   # AKI on CKD: appears to be around baseline of 1.4.  - IV hydration   # Diastolic CHF: most recent echo 05/2013 with EF 55-65% does not appear fluid overloaded on exam  - monitor clinically   # Paroxysmal afib: on admission was bradycardic, tachycardic over past 24 hours.  - on telemetry  - EKG yesterday with no significant change.  FEN/GI: saline lock, additional free water ordered as diet had been  fluid restricted and mild hypernatremia on 3/12 Prophylaxis: heparin  Disposition: pending family meeting for GOC  Subjective:  Denies pain, complaints. Says "she will be here overnight" multiple times, attempted to reorient her to time/place/situation.  Objective: Temp:  [97.8 F (36.6 C)-99 F (37.2 C)] 99 F (37.2 C) (03/14 0511) Pulse Rate:  [101-122] 112 (03/14 0511) Resp:  [20-22] 20 (03/14 0511) BP: (134-199)/(74-94) 163/84 mmHg (03/14 0511) SpO2:  [99 %-100 %] 99 % (03/14 0511) Physical Exam: General: NAD, laying in bed crooked HEENT: PERRL, EOMI Cardiovascular: RRR, 3/6 systolic murmur best on RUSB Respiratory: CTAB, normal effort Abdomen: soft, NTND, normal bowel sounds Extremities: no edema or cyanosis Neuro: alert, oriented to person/place/president but not time. No focal deficits.  Laboratory:  Recent Labs Lab 09/17/13 0544 09/18/13 0519 09/19/13 0935  WBC 16.2* 11.5* 8.6  HGB 9.6* 10.5* 9.1*  HCT 26.7* 28.7* 25.1*  PLT 130* 139* 125*    Recent Labs Lab 09/16/13 2205 09/17/13 0544 09/18/13 0519 09/19/13 0935  NA 140 143 143 148*  K 4.7 4.8 4.4 3.5*  CL 102 105 104 108  CO2 25 26 21 25   BUN 44* 39* 27* 31*  CREATININE 1.47* 1.50* 1.72* 1.70*  CALCIUM 10.0 9.7 9.7 8.7  PROT 7.8  --   --   --   BILITOT 0.3  --   --   --   ALKPHOS 103  --   --   --   ALT 21  --   --   --   AST 31  --   --   --   GLUCOSE 173* 76 65* 96  Imaging/Diagnostic Tests: None new  Tawni Carnes, MD 09/21/2013, 8:03 AM PGY-1, [facility] Family Medicine FPTS Intern pager: [phone], text pages welcome

## 2013-09-21 NOTE — Discharge Instructions (Addendum)
Kathleen [name] was admitted for being non-responsive. We started her on IV antibiotics and did blood and urine cultures which were both negative; antibiotics were stopped and she has done well off of them for 2 days. She had a CT of her head which did not show any bleeding or mass in her head. While being observed in the hospital she recovered without additional intervention. We suspect the reason for her mental status was either hypoactive delirium or from medication effect (seroquel).  We suggest avoiding the seroquel in the future, as this could have contributed to her presentation.

## 2013-09-21 NOTE — Progress Notes (Signed)
Palliative Medicine Team Progress Note  Called and spoke with patient's [place] Gully, daughter [name]. She has questions about what and why palliative medicine had been called. I explained to her our role in helping patients specifically to prepare them for possible disease trajectories, the importance of upholding the best possible QOL and also to make sure that her mother had advance directives in place. [name] told me that they see Dr. Jennette Kettle who provides them with this kind of information, that they currently have a good plan for in home providers and will talk on their own about code status and what kind of interventions their mom needs. I recommended that they complete a MOST form with Dr.Neal and referred her to online resources for information on MOST and Dementia. They may also do well with a referral to [facility] for caregivers. Family deferring meeting today with our team in order to facilitate discharge. I feel confident this family is aware of option and resources and can formalize those documents in teh outpatient setting.  Anderson Malta, DO Palliative Medicine

## 2013-09-21 NOTE — Progress Notes (Signed)
FMTS Attending Daily Note: Kathleen Levy MD [phone] pager office [phone] I  have seen and examined this patient, reviewed their chart. I have discussed this patient with the resident. I agree with the resident's findings, assessment and care plan. Discussed her care with her daughter, Kathleen [name]. Will not wait for palliative care meeting as family anxious to get her home. I will f/u as her PCP and family has my contac tinfo.

## 2013-09-22 NOTE — Progress Notes (Signed)
   CARE MANAGEMENT NOTE 09/22/2013  Patient:  Kathleen [name], Kathleen [name]   Account Number:  [id number]  Date Initiated:  09/20/2013  Documentation initiated by:  Letha Cape  Subjective/Objective Assessment:   dx acute encephalopathy  admit- lives with daughter.     Action/Plan:   pt eval-rec hhpt.  Palliative consult   Anticipated DC Date:  09/21/2013   Anticipated DC Plan:  HOME W HOME HEALTH SERVICES      DC Planning Services  CM consult      Choice offered to / List presented to:     DME arranged  HOSPITAL BED      DME agency  [facility]     HH arranged  HH-2 PT      [facility] agency  [facility]   Status of service:  Completed, signed off Medicare Important Message given?   (If response is "NO", the following Medicare IM given date fields will be blank) Date Medicare IM given:   Date Additional Medicare IM given:    Discharge Disposition:  HOME W HOME HEALTH SERVICES  Per UR Regulation:    If discussed at Long Length of Stay Meetings, dates discussed:    Comments:  09/22/13 07:52 CM notified [facility] via text of pt discharge last evening.  Bed was to be delivered to home between 6-10pm. No other CM needs were communicated.  Freddy Jaksch, BSN, [phone].  09/20/13 1601 Letha Cape RN, BSN [phone] patient lives with daughter, family has agreed to a palliative consult, but they plan to take patient back home per MD notes.  NCM will continue to follow for dc needs.

## 2013-09-23 LAB — CULTURE, BLOOD (ROUTINE X 2)
CULTURE: NO GROWTH
Culture: NO GROWTH

## 2013-09-24 NOTE — Discharge Summary (Signed)
Family Medicine Teaching Service  Discharge Note : Attending Kataleya Zaugg MD Pager [phone] Office [phone] I have seen and examined this patient, reviewed their chart and discussed discharge planning wit the resident at the time of discharge. I agree with the discharge plan as above.  

## 2013-10-01 ENCOUNTER — Other Ambulatory Visit: Payer: Self-pay | Admitting: Family Medicine

## 2013-10-01 MED ORDER — SULFAMETHOXAZOLE-TMP DS 800-160 MG PO TABS: 1.0000 | ORAL_TABLET | Freq: Two times a day (BID) | ORAL | Status: DC

## 2013-10-01 MED ORDER — BENZONATATE 200 MG PO CAPS: 200.0000 mg | ORAL_CAPSULE | Freq: Two times a day (BID) | ORAL | Status: DC | PRN

## 2013-10-01 NOTE — Progress Notes (Signed)
Cough mildly productive tan sputum. No fever/ D with her daughter who would like abx called in

## 2013-10-03 ENCOUNTER — Emergency Department (INDEPENDENT_AMBULATORY_CARE_PROVIDER_SITE_OTHER)
Admission: EM | Admit: 2013-10-03 | Discharge: 2013-10-03 | Disposition: A | Discharge status: Home / Self Care | Payer: Medicare Other | Source: Home / Self Care

## 2013-10-03 ENCOUNTER — Emergency Department (HOSPITAL_COMMUNITY): Payer: Medicare Other

## 2013-10-03 ENCOUNTER — Emergency Department (INDEPENDENT_AMBULATORY_CARE_PROVIDER_SITE_OTHER): Payer: Medicare Other

## 2013-10-03 ENCOUNTER — Encounter (HOSPITAL_COMMUNITY): Payer: Self-pay | Admitting: Emergency Medicine

## 2013-10-03 DIAGNOSIS — R062 Wheezing: Secondary | ICD-10-CM

## 2013-10-03 DIAGNOSIS — J189 Pneumonia, unspecified organism: Secondary | ICD-10-CM

## 2013-10-03 MED ORDER — IPRATROPIUM-ALBUTEROL 0.5-2.5 (3) MG/3ML IN SOLN
3.0000 mL | Freq: Once | RESPIRATORY_TRACT | Status: AC
  Administered 2013-10-03: 3 mL via RESPIRATORY_TRACT

## 2013-10-03 MED ORDER — PREDNISONE 10 MG PO TABS: ORAL_TABLET | ORAL | Status: DC

## 2013-10-03 MED ORDER — IPRATROPIUM-ALBUTEROL 0.5-2.5 (3) MG/3ML IN SOLN
RESPIRATORY_TRACT | Status: AC
  Filled 2013-10-03: qty 3

## 2013-10-03 MED ORDER — DOXYCYCLINE MONOHYDRATE 100 MG PO CAPS: 100.0000 mg | ORAL_CAPSULE | Freq: Two times a day (BID) | ORAL | Status: DC

## 2013-10-03 NOTE — ED Provider Notes (Signed)
CSN: [id number]     Arrival date & time 10/03/13  1955 History   None    Chief Complaint  Patient presents with  . Cough  . Nasal Congestion   (Consider location/radiation/quality/duration/timing/severity/associated sxs/prior Treatment) HPI Comments: 80 yo AAF present with increased wheezing x several days. She was put on Bactrim and Tessalon perles by PCP w/o any improvement. She has not been able to cough anything up but feels the rattling in her chest. She does have a HX of CHF but denies increased edema or SOB.   Patient is a 80 y.o. female presenting with cough.  Cough Associated symptoms: wheezing     Past Medical History  Diagnosis Date  . Hypertension   . Arthritis   . Spinal stenosis   . Osteoarthritis   . Anemia   . CHF (congestive heart failure)   . Heart murmur     aortic regurg  . Osteoporosis   . Chronic kidney disease   . Generalized OA 12/16/2011    Significant. Seems to have encompass all joints. Patient is complaining of significant pain but dementia allows patient to be happy.  . Dementia   . SunDown syndrome    Past Surgical History  Procedure Laterality Date  . Nephrectomy Right   . Tonsillectomy     Family History  Problem Relation Age of Onset  . Hypertension Daughter    History  Substance Use Topics  . Smoking status: Former Games developer  . Smokeless tobacco: Never Used     Comment: quit smoking in the 70's  . Alcohol Use: No   OB History   Grav Para Term Preterm Abortions TAB SAB Ect Mult Living                 Review of Systems  HENT: Positive for congestion.   Respiratory: Positive for cough and wheezing.   All other systems reviewed and are negative.    Allergies  Review of patient's allergies indicates no known allergies.  Home Medications   Current Outpatient Rx  Name  Route  Sig  Dispense  Refill  . amLODipine (NORVASC) 5 MG tablet   Oral   Take 5 mg by mouth daily.         Marland Kitchen aspirin 81 MG tablet   Oral   Take 1 tablet  (81 mg total) by mouth daily.   30 tablet   3   . benzonatate (TESSALON) 200 MG capsule   Oral   Take 1 capsule (200 mg total) by mouth 2 (two) times daily as needed for cough.   60 capsule   1   . calcium-vitamin D (OSCAL WITH D) 500-200 MG-UNIT per tablet   Oral   Take 1 tablet by mouth daily.          Marland Kitchen doxycycline (MONODOX) 100 MG capsule   Oral   Take 1 capsule (100 mg total) by mouth 2 (two) times daily.   20 capsule   0   . ferrous sulfate (FERROUSUL) 325 (65 FE) MG tablet   Oral   Take 325 mg by mouth daily.          . Multiple Vitamins-Minerals (SENIOR MULTIVITAMIN PLUS) TABS   Oral   Take 1 tablet by mouth 1 dose over 24 hours.   100 tablet   12   . predniSONE (DELTASONE) 10 MG tablet      1 po TID x 3 days, 1 PO BID x 3 days, 1 po QD x  5 days   20 tablet   0   . QUEtiapine (SEROQUEL) 25 MG tablet   Oral   Take 1 tablet (25 mg total) by mouth at bedtime.   30 tablet   0    BP 141/66  Pulse 81  Temp(Src) 98.9 F (37.2 C) (Oral)  Resp 16  SpO2 98% Physical Exam  Nursing note and vitals reviewed. Constitutional: She is oriented to person, place, and time. She appears well-developed and well-nourished.  HENT:  Head: Normocephalic and atraumatic.  Right Ear: External ear normal.  Left Ear: External ear normal.  Nose: Nose normal.  Mouth/Throat: Oropharynx is clear and moist. No oropharyngeal exudate.  Cerumen bilateral canals obstructs TMs  Eyes: Conjunctivae and EOM are normal.  Neck: Normal range of motion.  Cardiovascular: Normal rate, regular rhythm, normal heart sounds and intact distal pulses.   1 + nonpitting Bil LE EDEMA  Pulmonary/Chest: Effort normal. She has wheezes.  ? rhonchi  Musculoskeletal:  wheelchair  Lymphadenopathy:    She has no cervical adenopathy.  Neurological: She is alert and oriented to person, place, and time.  Skin: Skin is warm and dry.  Psychiatric: She has a normal mood and affect. Judgment normal.     ED Course  Procedures (including critical care time) Labs Review Labs Reviewed - No data to display Imaging Review Dg Chest 1 View  10/03/2013   CLINICAL DATA:  Cough, congestion and fever. Chest x-ray 03/06/2014.  EXAM: CHEST - 1 VIEW  COMPARISON:  Multiple priors, most recently chest x-ray 08/17/2013.  FINDINGS: There are 2 dense nodules in the right upper lobe, largest of which measures 1.2 cm in diameter, similar to numerous prior examinations, favored to represent calcified granulomas. No consolidative airspace disease. No pleural effusions. No evidence of pulmonary edema. Heart size is mildly enlarged. The patient is rotated to the right on today's exam, resulting in distortion of the mediastinal contours and reduced diagnostic sensitivity and specificity for mediastinal pathology. Atherosclerosis in the thoracic aorta.  IMPRESSION: 1. No radiographic evidence of acute cardiopulmonary disease. 2. Cardiomegaly. 3. Atherosclerosis. 4. Stable right upper lobe pulmonary nodules compared to multiple prior examinations dating back to at least 02/18/2013. These are favored to represent calcified granulomas, however, these are new compared to more remote prior study from 09/11/2006. Although favored to be benign, these could be definitively characterized with followup nonemergent noncontrast chest CT if clinically appropriate.   Electronically Signed   By: Trudie Reedaniel  Entrikin M.D.   On: 10/03/2013 21:34     MDM   1. Community acquired pneumonia   2. Wheeze   3. Hx OF CHF- D/C bactrim, start DOXY 100 mg AD, Pred dp 10 mg AD, continue Tessalon perles. Advised F/u at PCP tomorrow to recheck Lungs, evaluate for Nebulizer at home and possible further evaluation of chest with CT. w/c if SX increase or ER. Duoneb given in office with positive improvement with production from chest.     Berenice PrimasMelissa R Shacoya Burkhammer, PA-C 10/03/13 2145

## 2013-10-03 NOTE — ED Notes (Signed)
Cough and congestion, wheezing.  Started an antibiotic on tuesday as prescribed by pcp.  Daughter is noticing wheezing and concerned for congestion and wheezing.

## 2013-10-04 NOTE — ED Provider Notes (Signed)
Medical screening examination/treatment/procedure(s) were performed by non-physician practitioner and as supervising physician I was immediately available for consultation/collaboration.  Leslee Home, M.D.   Reuben Likes, MD 10/04/13 [phone]

## 2013-10-25 ENCOUNTER — Encounter: Payer: Self-pay | Admitting: Family Medicine

## 2013-10-25 ENCOUNTER — Ambulatory Visit (INDEPENDENT_AMBULATORY_CARE_PROVIDER_SITE_OTHER): Payer: Medicare Other | Admitting: Family Medicine

## 2013-10-25 ENCOUNTER — Ambulatory Visit: Payer: Medicare Other | Admitting: Family Medicine

## 2013-10-25 VITALS — BP 140/70 | HR 60 | Ht 59.0 in | Wt 118.8 lb

## 2013-10-25 DIAGNOSIS — R600 Localized edema: Secondary | ICD-10-CM

## 2013-10-25 DIAGNOSIS — R609 Edema, unspecified: Secondary | ICD-10-CM

## 2013-10-25 DIAGNOSIS — M79604 Pain in right leg: Secondary | ICD-10-CM

## 2013-10-25 DIAGNOSIS — M79609 Pain in unspecified limb: Secondary | ICD-10-CM

## 2013-10-25 LAB — CBC
HEMATOCRIT: 27.7 % — AB (ref 36.0–46.0)
Hemoglobin: 9.7 g/dL — ABNORMAL LOW (ref 12.0–15.0)
MCH: 30.6 pg (ref 26.0–34.0)
MCHC: 35 g/dL (ref 30.0–36.0)
MCV: 87.4 fL (ref 78.0–100.0)
Platelets: 150 10*3/uL (ref 150–400)
RBC: 3.17 MIL/uL — AB (ref 3.87–5.11)
RDW: 18.4 % — ABNORMAL HIGH (ref 11.5–15.5)
WBC: 4.1 10*3/uL (ref 4.0–10.5)

## 2013-10-25 MED ORDER — ACETAMINOPHEN 650 MG PO TABS: 1.0000 | ORAL_TABLET | Freq: Four times a day (QID) | ORAL | Status: DC

## 2013-10-25 NOTE — Patient Instructions (Signed)
Like we talked about, we are going to get some lab work to make sure nothing else is explaining the swelling.  We are also going to check an ultrasound to rule out a leg clot.  I am giving you a prescription for compression stockings to use to help with the swelling.  We are also going to schedule tylenol four times a day for the next few days.   Please follow up early next week.

## 2013-10-25 NOTE — Assessment & Plan Note (Signed)
Bilateral, ongoing for 1 week.  - check CBC for anemia as contributor to edema - check CMP for renal and liver function in setting of edema - will obtain lower extremity doppler to rule out DVT given patient's family's concern.  - Rx for DME [facility] written for patient, medically necessary.

## 2013-10-25 NOTE — Assessment & Plan Note (Signed)
Posterior thigh pain, possibly referred pain from hip.  Previous xray from 08/07/13 showing possible avascular necrosis of the hip but decision not to undergo surgery.  Will treat with tylenol 650mg  qid scheduled over the weekend  Follow up early next week.

## 2013-10-25 NOTE — Progress Notes (Signed)
Patient ID: Kathleen [name]    DOB: 1923-07-08, 80 y.o.   MRN: [id number] --- Subjective:  Kathleen [name] is a 80 y.o.female with h/o dementia, CKD3, hypertension, paroxysmal a-fib who presents with lower extremity swelling. She is in the clinic with her grand-daughter and her caregiver. Patient lives at home.   She has had pain in the posterior aspect of her right thigh for several months, worst with standing and walking. In the last week, started having pain in the left leg as well. Additionally, her daughter and grand-daughter noticed increased swelling of her lower extremities and swelling behind her knee right more than left.  She has had multiple falls in the last several months. Last fall was 2-3 days ago.   ROS: see HPI Past Medical History: reviewed and updated medications and allergies. Social History: Tobacco: none  Objective: Filed Vitals:   10/25/13 1340  BP: 140/70  Pulse: 60    Physical Examination:   General appearance - alert, frail appearing, in no acute distress, speaks and tries to express nature of her pain, no oriented to place or time.  Chest - clear to auscultation, no wheezes, rales or rhonchi, symmetric air entry Heart - normal rate, regular rhythm, normal S1, S2, 3/6 systolic murmur best heard at the right upper sternal border Lower extremities - 3+ pitting edema to below knee, fat pad present behind left knee but no evidence of baker's cyst bilaterally.  No calf tenderness, negative Homan's, diameter of calf on right: 34cm, left: 35cm, right thigh diameter: 45cm and left 47cm.  Arthritic changes of knees bilaterally, no effusion Patient able to flex hips bilaterally Some pain with external rotation of right hip. Normal external and internal rotation of left hip.   EXAM: 08/07/13 BILATERAL HIP WITH PELVIS - 4+ VIEW  COMPARISON: None.  FINDINGS:  There is no fracture or dislocation. There mild osteoarthritic  osteoarthritis changes of the left hip. There is  patchy sclerosis  and lucency within the right femoral head as can be seen with  avascular necrosis. There is no articular surface irregularity. The  SI joints are unremarkable.  There is a calcified mass in the right side of the pelvis likely  representing a calcified uterine leiomyoma.  IMPRESSION:  1. Mild osteoarthritis of the left hip.  2. Patchy sclerosis and lucency within the right femoral head as can  be seen with avascular necrosis. There is no articular surface  irregularity.

## 2013-10-26 LAB — COMPREHENSIVE METABOLIC PANEL
ALT: 15 U/L (ref 0–35)
AST: 21 U/L (ref 0–37)
Albumin: 3.5 g/dL (ref 3.5–5.2)
Alkaline Phosphatase: 72 U/L (ref 39–117)
BUN: 24 mg/dL — AB (ref 6–23)
CALCIUM: 8.9 mg/dL (ref 8.4–10.5)
CHLORIDE: 106 meq/L (ref 96–112)
CO2: 29 meq/L (ref 19–32)
CREATININE: 1.31 mg/dL — AB (ref 0.50–1.10)
Glucose, Bld: 82 mg/dL (ref 70–99)
Potassium: 4.7 mEq/L (ref 3.5–5.3)
Sodium: 144 mEq/L (ref 135–145)
Total Bilirubin: 0.2 mg/dL (ref 0.2–1.2)
Total Protein: 6.8 g/dL (ref 6.0–8.3)

## 2013-10-26 LAB — PRO B NATRIURETIC PEPTIDE: PRO B NATRI PEPTIDE: 714.7 pg/mL — AB (ref <451)

## 2013-10-28 ENCOUNTER — Telehealth: Payer: Self-pay | Admitting: Family Medicine

## 2013-10-28 ENCOUNTER — Ambulatory Visit (HOSPITAL_COMMUNITY)
Admission: RE | Admit: 2013-10-28 | Discharge: 2013-10-28 | Disposition: A | Discharge status: Home / Self Care | Payer: Medicare Other | Source: Ambulatory Visit | Attending: Family Medicine | Admitting: Family Medicine

## 2013-10-28 DIAGNOSIS — R600 Localized edema: Secondary | ICD-10-CM

## 2013-10-28 DIAGNOSIS — M7989 Other specified soft tissue disorders: Secondary | ICD-10-CM | POA: Insufficient documentation

## 2013-10-28 NOTE — Telephone Encounter (Signed)
Left message for patient to please call.  See MD message below.  Radene Ou, CMA

## 2013-10-28 NOTE — Telephone Encounter (Signed)
Please let patient's family know that the ultrasound of her legs was normal and did not show a clot in her leg.  Her labs showed improved kidney function, normal liver function. Her anemia is stable.    Thank you!  Marena Chancy, PGY-3 Family Medicine Resident

## 2013-10-28 NOTE — Progress Notes (Signed)
*  Preliminary Results* Bilateral lower extremity venous duplex completed. Bilateral lower extremities are negative for deep vein thrombosis. There is no evidence of Baker's cyst bilaterally.  10/28/2013  Gertie Fey, RVT, RDCS, RDMS

## 2013-11-14 ENCOUNTER — Ambulatory Visit
Admission: RE | Admit: 2013-11-14 | Discharge: 2013-11-14 | Disposition: A | Discharge status: Home / Self Care | Payer: Medicare Other | Source: Ambulatory Visit | Attending: Family Medicine | Admitting: Family Medicine

## 2013-11-14 ENCOUNTER — Ambulatory Visit (INDEPENDENT_AMBULATORY_CARE_PROVIDER_SITE_OTHER): Payer: Medicare Other | Admitting: Family Medicine

## 2013-11-14 ENCOUNTER — Encounter: Payer: Self-pay | Admitting: Family Medicine

## 2013-11-14 VITALS — BP 138/60 | HR 65

## 2013-11-14 DIAGNOSIS — R600 Localized edema: Secondary | ICD-10-CM

## 2013-11-14 DIAGNOSIS — I509 Heart failure, unspecified: Secondary | ICD-10-CM

## 2013-11-14 DIAGNOSIS — I10 Essential (primary) hypertension: Secondary | ICD-10-CM

## 2013-11-14 DIAGNOSIS — R609 Edema, unspecified: Secondary | ICD-10-CM

## 2013-11-14 DIAGNOSIS — R059 Cough, unspecified: Secondary | ICD-10-CM

## 2013-11-14 DIAGNOSIS — R05 Cough: Secondary | ICD-10-CM

## 2013-11-14 LAB — COMPREHENSIVE METABOLIC PANEL
ALBUMIN: 3.5 g/dL (ref 3.5–5.2)
ALT: 20 U/L (ref 0–35)
AST: 25 U/L (ref 0–37)
Alkaline Phosphatase: 91 U/L (ref 39–117)
BUN: 56 mg/dL — ABNORMAL HIGH (ref 6–23)
CALCIUM: 9.5 mg/dL (ref 8.4–10.5)
CO2: 27 meq/L (ref 19–32)
Chloride: 107 mEq/L (ref 96–112)
Creat: 1.36 mg/dL — ABNORMAL HIGH (ref 0.50–1.10)
Glucose, Bld: 83 mg/dL (ref 70–99)
POTASSIUM: 4.4 meq/L (ref 3.5–5.3)
SODIUM: 145 meq/L (ref 135–145)
TOTAL PROTEIN: 7.2 g/dL (ref 6.0–8.3)
Total Bilirubin: 0.3 mg/dL (ref 0.2–1.2)

## 2013-11-14 MED ORDER — FUROSEMIDE 20 MG PO TABS: 20.0000 mg | ORAL_TABLET | Freq: Every day | ORAL | Status: DC

## 2013-11-14 NOTE — Progress Notes (Signed)
Subjective:    Kathleen [name] is a 80 y.o. female whowith h/o dementia, CKD3, hypertension, paroxysmal a-fib who presents with lower extremity swelling. She is in the clinic with her grand-daughter and her caregiver. Patient lives at home.   1.  LE edema:  Present for about 2 weeks.  Seen here in clinic for same 4/17, negative labwork and negative LE dopplers then.  Initially improved with compression stockings but pt refuses to keep legs elevated consistently.  Cough present x 3 days, productive of thick white sputum.  No fevers or chills.  No chest pain/palpitations.   Has history of CKD Stage III/IV.  No documentation of CHF in problem list but does have cardiomegaly on multiple CXRs.  Also with anemia, usually with low albumin but not recentlyu.  .  ROS as above per HPI, otherwise neg.    The following portions of the patient's history were reviewed and updated as appropriate: allergies, current medications, past medical history, family and social history, and problem list. Patient is a nonsmoker.    PMH reviewed.  Past Medical History  Diagnosis Date  . Hypertension   . Arthritis   . Spinal stenosis   . Osteoarthritis   . Anemia   . CHF (congestive heart failure)   . Heart murmur     aortic regurg  . Osteoporosis   . Chronic kidney disease   . Generalized OA 12/16/2011    Significant. Seems to have encompass all joints. Patient is complaining of significant pain but dementia allows patient to be happy.  . Dementia   . SunDown syndrome    Past Surgical History  Procedure Laterality Date  . Nephrectomy Right   . Tonsillectomy      Medications reviewed. Current Outpatient Prescriptions  Medication Sig Dispense Refill  . Acetaminophen 650 MG TABS Take 1 tablet (650 mg total) by mouth 4 (four) times daily.  60 tablet  0  . amLODipine (NORVASC) 5 MG tablet Take 5 mg by mouth daily.      Marland Kitchen aspirin 81 MG tablet Take 1 tablet (81 mg total) by mouth daily.  30 tablet  3  .  benzonatate (TESSALON) 200 MG capsule Take 1 capsule (200 mg total) by mouth 2 (two) times daily as needed for cough.  60 capsule  1  . calcium-vitamin D (OSCAL WITH D) 500-200 MG-UNIT per tablet Take 1 tablet by mouth daily.       . Multiple Vitamins-Minerals (SENIOR MULTIVITAMIN PLUS) TABS Take 1 tablet by mouth 1 dose over 24 hours.  100 tablet  12  . ferrous sulfate (FERROUSUL) 325 (65 FE) MG tablet Take 325 mg by mouth daily.       . QUEtiapine (SEROQUEL) 25 MG tablet Take 1 tablet (25 mg total) by mouth at bedtime.  30 tablet  0   No current facility-administered medications for this visit.     Objective:   Physical Exam BP 138/60  Pulse 65 Gen:  Alert, cooperative patient who appears stated age in no acute distress.  Vital signs reviewed. HEENT: EOMI,  MMM.  Cardiac:  RRR with Grade III murmur RUSB Pulm:  Mild rhonchi throughout and expiratory wheeze.  Crackles BL bases.    Exts: +3 edema BL ankles, +1 edema rising to mid-shins.    No results found for this or any previous visit (from the past 72 hour(s)).

## 2013-11-14 NOTE — Patient Instructions (Addendum)
We will recheck your labs today. Chest xray today to look how much fluid is in her lungs. I will send in a low dose fluid pill for you to take for the swelling and for the fluids.  Take this once daily until you return.  Keep wearing the compression stockings.  Keep your legs elevated.   You have several reasons for fluid to build up:   1) Kidneys aren't as strong as they used to be. 2) You have a history of heart failure (which means the heart doesn't pump as strong as it should) 3)  You have anemia.  Restart the iron pill 4) Amlodipine can cause leg swelling.  I don't think this is contributing.    Come back on Monday or Tuesday so we can recheck you.

## 2013-11-14 NOTE — Assessment & Plan Note (Signed)
Likely multifactorial:  CHF, CKD, low albumin, anemia, amlodipine usage.   - CXR today to evaluate for any fluid overload - low dose lasix as now with edema - continue compression stockings and leg elevation - repeat labs today, mostly at patient request but also to re-eval kidney failure and albumin status - RTC in 3-4 days.  REcommended weighing at home and following these.  Unable to weigh today in clinic.

## 2013-11-15 ENCOUNTER — Telehealth: Payer: Self-pay | Admitting: Family Medicine

## 2013-11-15 NOTE — Telephone Encounter (Signed)
Called and relayed results of CXR and lab findings with patient's granddaughter, who was present at appt yesterday. Did look minimally dehydrated with elevated BUN higher than usual.  Plan to attempt trial of very low dose lasix for next several days to help with minimal pulm edema and BL LE edema, but real treatment will be compression and leg elevation.  FU appt on TUes.  Would recommend holding lasix indefinitely at that time, unless still with evidence of overload.    Also, I provided patient's daughter Abundio Miu with written prescription for new compression stockings of moderate strength yesterday afternoon after patient had left office.

## 2013-11-18 ENCOUNTER — Encounter: Payer: Self-pay | Admitting: Family Medicine

## 2013-11-18 ENCOUNTER — Ambulatory Visit
Admission: RE | Admit: 2013-11-18 | Discharge: 2013-11-18 | Disposition: A | Discharge status: Home / Self Care | Payer: Medicare Other | Source: Ambulatory Visit | Attending: Family Medicine | Admitting: Family Medicine

## 2013-11-18 ENCOUNTER — Ambulatory Visit (INDEPENDENT_AMBULATORY_CARE_PROVIDER_SITE_OTHER): Payer: Medicare Other | Admitting: Family Medicine

## 2013-11-18 VITALS — BP 166/74 | HR 77 | Wt 117.6 lb

## 2013-11-18 DIAGNOSIS — I509 Heart failure, unspecified: Secondary | ICD-10-CM

## 2013-11-18 DIAGNOSIS — B9789 Other viral agents as the cause of diseases classified elsewhere: Principal | ICD-10-CM

## 2013-11-18 DIAGNOSIS — J069 Acute upper respiratory infection, unspecified: Secondary | ICD-10-CM

## 2013-11-18 DIAGNOSIS — R918 Other nonspecific abnormal finding of lung field: Secondary | ICD-10-CM

## 2013-11-18 MED ORDER — GUAIFENESIN-CODEINE 100-10 MG/5ML PO SYRP: 5.0000 mL | ORAL_SOLUTION | Freq: Three times a day (TID) | ORAL | Status: DC | PRN

## 2013-11-18 MED ORDER — FUROSEMIDE 20 MG PO TABS: 20.0000 mg | ORAL_TABLET | Freq: Every day | ORAL | Status: DC | PRN

## 2013-11-18 NOTE — Patient Instructions (Addendum)
Thank you for coming in today!  I think the symptoms are due to a viral infection, but I would like to have another X ray of her chest to make sure there is no pneumonia.   She should take the cough syrup as needed for cough. This will make her very drowsy, but should also help break up any mucus in her airways.   You can stop the lasix when her swelling is like it is now. Keep it and give it once a day if the swelling gets worse again or if she gains more than 3 pounds in 1 day or 5 pounds in 1 week. The main treatment is leg elevation and compression stockings.   Take care and seek immediate care sooner if you develop any concerns.  Please feel free to call with any questions or concerns at any time, at [phone]. - Dr. Jarvis Newcomer

## 2013-11-18 NOTE — Progress Notes (Signed)
Patient ID: Kathleen [name], female   DOB: [birth year], 80 y.o.   MRN: [id number]   Subjective:  HPI:   Kathleen [name] is a 80 y.o. female with a history of dementia, CKD3, hypertension, paroxysmal a-fib who presents with lower extremity swelling.   LE edema: Present for about 2 weeks. Seen here in clinic for same 4/17, negative labwork and negative LE dopplers then. Initially improved with compression stockings but pt has a hard time keeping legs elevated consistently.   Cough present x 3 days, productive of thick white sputum. No fevers or chills. No chest pain/palpitations. Some rhinorrhea preceded this. Cough keeps her awake at night, causing caretakers to lose sleep.   Has history of CKD Stage III/IV. CHF newly documented with cardiomegaly on multiple CXRs and last BNP was 714 on 4/17. Echo from Nov 2014 showed normal EF and severe LVH, but did not remark on diastolic dysfunction. Last albumin was normal at 3.5. Latest LFTs normal.   Review of Systems:  Per HPI. All other systems reviewed and are negative.    Past Medical History: Patient Active Problem List   Diagnosis Date Noted  . Viral URI with cough 11/18/2013  . Bilateral lower extremity edema 10/25/2013  . Right leg pain 10/25/2013  . Acute encephalopathy 09/17/2013  . Altered mental status 09/16/2013  . Hip pain, bilateral 08/07/2013  . Unintentional weight loss 06/13/2013  . Agitation 06/12/2013  . Difficulty walking 03/06/2013  . 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 Body temperature low 02/18/2013  . Irregular bowel habits 11/08/2012  . At high risk for falls 10/02/2012  . Anemia 02/24/2012  . Generalized OA 12/16/2011  . Unspecified conditions influencing health status 12/16/2011  . Dementia 10/13/2010  . Paroxysmal atrial fibrillation 10/13/2010  . Hypertension 10/13/2010  . Kidney disease, chronic, stage III (GFR 30-59 ml/min) 10/13/2010  . Osteoporosis 10/13/2010  .  CHF (congestive heart failure) 10/13/2010   Medications: reviewed and updated Current Outpatient Prescriptions  Medication Sig Dispense Refill  . Acetaminophen 650 MG TABS Take 1 tablet (650 mg total) by mouth 4 (four) times daily.  60 tablet  0  . amLODipine (NORVASC) 5 MG tablet Take 5 mg by mouth daily.      Marland Kitchen. aspirin 81 MG tablet Take 1 tablet (81 mg total) by mouth daily.  30 tablet  3  . benzonatate (TESSALON) 200 MG capsule Take 1 capsule (200 mg total) by mouth 2 (two) times daily as needed for cough.  60 capsule  1  . calcium-vitamin D (OSCAL WITH D) 500-200 MG-UNIT per tablet Take 1 tablet by mouth daily.       . ferrous sulfate (FERROUSUL) 325 (65 FE) MG tablet Take 325 mg by mouth daily.       . furosemide (LASIX) 20 MG tablet Take 1 tablet (20 mg total) by mouth daily as needed.  30 tablet  3  . guaiFENesin-codeine (ROBITUSSIN AC) 100-10 MG/5ML syrup Take 5 mLs by mouth 3 (three) times daily as needed for cough.  120 mL  0  . Multiple Vitamins-Minerals (SENIOR MULTIVITAMIN PLUS) TABS Take 1 tablet by mouth 1 dose over 24 hours.  100 tablet  12  . QUEtiapine (SEROQUEL) 25 MG tablet Take 1 tablet (25 mg total) by mouth at bedtime.  30 tablet  0   No current facility-administered medications for this visit.    Objective:  Physical Exam: BP 166/74  Pulse 77  Wt 117 lb 9.6  oz (53.343 kg)  Gen: Frail, pleasant 80 y.o. female in NAD HEENT: MMM, EOMI, PERRL, anicteric sclerae, arcus senilis CV: RRR, no MRG, no JVD Resp: Non-labored, CTAB, no wheezes noted Abd: Soft, NTND, BS present, no guarding or organomegaly MSK: 1+ pitting bilateral LE edema noted with varicosites. Wearing compression stockings.   Assessment:     Kathleen [name] is a 80 y.o. female here for cold symptoms and LE edema    Plan:     See problem list for problem-specific plans.

## 2013-11-21 ENCOUNTER — Other Ambulatory Visit: Payer: Self-pay | Admitting: *Deleted

## 2013-11-21 ENCOUNTER — Telehealth: Payer: Self-pay | Admitting: Family Medicine

## 2013-11-21 MED ORDER — LORATADINE 10 MG PO TABS: 5.0000 mg | ORAL_TABLET | Freq: Every day | ORAL | Status: DC

## 2013-11-21 MED ORDER — SULFAMETHOXAZOLE-TMP DS 800-160 MG PO TABS: 1.0000 | ORAL_TABLET | Freq: Two times a day (BID) | ORAL | Status: DC

## 2013-11-21 MED ORDER — IPRATROPIUM BROMIDE 0.03 % NA SOLN: 2.0000 | Freq: Two times a day (BID) | NASAL | Status: DC

## 2013-11-21 NOTE — Telephone Encounter (Signed)
Granddaughter called and would like the results of her grandmothers test results. jw

## 2013-11-21 NOTE — Assessment & Plan Note (Signed)
Appears to be viral URI, will get repeat CXR to make sure no infiltrate as a cause of cough. Rx cough syrup for sleep with guaifenesin for mucolytic. RTC criteria reviewed.

## 2013-11-21 NOTE — Telephone Encounter (Signed)
Asked my patient's daughter to review CXR as patient continues to have productive cough with sneezing. Mucus is yellow-green there is no fever. Patient tried robitussin AC and tesslon perles w/o improvement in cough. The patient felt dizzy after taking rbitussion AC. Patient is home with her granddaughter.   Reviewed CXR: cardiomegaly w/o infiltrate or interstitial edema.? Retrosternal opacity. Defer f/u to PCP.  ? CT.   Plan: Stop robitussin AC, risk of fall.  claritin 5 mg daily. atrovent nasal spray.  Patient's daughter Andria Meuse, agreed with plan and voiced understanding.

## 2013-11-22 DIAGNOSIS — R918 Other nonspecific abnormal finding of lung field: Secondary | ICD-10-CM | POA: Insufficient documentation

## 2013-11-22 NOTE — Assessment & Plan Note (Signed)
No signs of volume overload, suspect LE edema is due to venous insufficiency. Told to hold scheduled lasix 20mg  and to use prn weight gain > 3 lbs in a day or 5 lbs in a week as well as to call the office.

## 2013-11-22 NOTE — Telephone Encounter (Signed)
informed

## 2013-11-22 NOTE — Telephone Encounter (Signed)
Dear [place] Asters Team All looked pretty normal THANKS! Nestor Ramp

## 2013-11-23 ENCOUNTER — Emergency Department (HOSPITAL_COMMUNITY): Payer: Medicare Other

## 2013-11-23 ENCOUNTER — Encounter (HOSPITAL_COMMUNITY): Payer: Self-pay | Admitting: Emergency Medicine

## 2013-11-23 ENCOUNTER — Observation Stay (HOSPITAL_COMMUNITY)
Admission: EM | Admit: 2013-11-23 | Discharge: 2013-11-25 | Disposition: A | Discharge status: Home / Self Care | Payer: Medicare Other | Attending: Family Medicine | Admitting: Family Medicine

## 2013-11-23 DIAGNOSIS — M810 Age-related osteoporosis without current pathological fracture: Secondary | ICD-10-CM | POA: Insufficient documentation

## 2013-11-23 DIAGNOSIS — J40 Bronchitis, not specified as acute or chronic: Secondary | ICD-10-CM | POA: Diagnosis present

## 2013-11-23 DIAGNOSIS — Z87891 Personal history of nicotine dependence: Secondary | ICD-10-CM | POA: Insufficient documentation

## 2013-11-23 DIAGNOSIS — J209 Acute bronchitis, unspecified: Secondary | ICD-10-CM

## 2013-11-23 DIAGNOSIS — E869 Volume depletion, unspecified: Secondary | ICD-10-CM | POA: Insufficient documentation

## 2013-11-23 DIAGNOSIS — I509 Heart failure, unspecified: Secondary | ICD-10-CM

## 2013-11-23 DIAGNOSIS — Z9181 History of falling: Secondary | ICD-10-CM

## 2013-11-23 DIAGNOSIS — F0390 Unspecified dementia without behavioral disturbance: Secondary | ICD-10-CM

## 2013-11-23 DIAGNOSIS — M159 Polyosteoarthritis, unspecified: Secondary | ICD-10-CM | POA: Insufficient documentation

## 2013-11-23 DIAGNOSIS — Z905 Acquired absence of kidney: Secondary | ICD-10-CM | POA: Insufficient documentation

## 2013-11-23 DIAGNOSIS — Z7982 Long term (current) use of aspirin: Secondary | ICD-10-CM | POA: Insufficient documentation

## 2013-11-23 DIAGNOSIS — N189 Chronic kidney disease, unspecified: Secondary | ICD-10-CM | POA: Insufficient documentation

## 2013-11-23 DIAGNOSIS — I4891 Unspecified atrial fibrillation: Secondary | ICD-10-CM | POA: Insufficient documentation

## 2013-11-23 DIAGNOSIS — I428 Other cardiomyopathies: Secondary | ICD-10-CM | POA: Insufficient documentation

## 2013-11-23 DIAGNOSIS — J189 Pneumonia, unspecified organism: Secondary | ICD-10-CM | POA: Insufficient documentation

## 2013-11-23 DIAGNOSIS — I129 Hypertensive chronic kidney disease with stage 1 through stage 4 chronic kidney disease, or unspecified chronic kidney disease: Secondary | ICD-10-CM | POA: Insufficient documentation

## 2013-11-23 DIAGNOSIS — N179 Acute kidney failure, unspecified: Principal | ICD-10-CM

## 2013-11-23 HISTORY — DX: Unspecified atrial fibrillation: I48.91

## 2013-11-23 LAB — I-STAT CG4 LACTIC ACID, ED: Lactic Acid, Venous: 1.14 mmol/L (ref 0.5–2.2)

## 2013-11-23 NOTE — ED Notes (Signed)
Family has not given pt any tylenol today

## 2013-11-23 NOTE — ED Notes (Signed)
Family reports pt spiked a fever at home. Highest temp 100.2. Family reports lethargy, audio and visual hallucinations, coughing up mucus. Family denies n/v/d, dysuria, increase frequency of urination. Pt baseline mentation is alert, oriented to person only.

## 2013-11-24 ENCOUNTER — Encounter (HOSPITAL_COMMUNITY): Payer: Self-pay | Admitting: Emergency Medicine

## 2013-11-24 DIAGNOSIS — J209 Acute bronchitis, unspecified: Secondary | ICD-10-CM

## 2013-11-24 DIAGNOSIS — Z9181 History of falling: Secondary | ICD-10-CM

## 2013-11-24 DIAGNOSIS — I509 Heart failure, unspecified: Secondary | ICD-10-CM

## 2013-11-24 DIAGNOSIS — J40 Bronchitis, not specified as acute or chronic: Secondary | ICD-10-CM | POA: Diagnosis present

## 2013-11-24 DIAGNOSIS — F0390 Unspecified dementia without behavioral disturbance: Secondary | ICD-10-CM

## 2013-11-24 DIAGNOSIS — N179 Acute kidney failure, unspecified: Secondary | ICD-10-CM

## 2013-11-24 LAB — URINALYSIS, ROUTINE W REFLEX MICROSCOPIC
Bilirubin Urine: NEGATIVE
GLUCOSE, UA: NEGATIVE mg/dL
HGB URINE DIPSTICK: NEGATIVE
KETONES UR: NEGATIVE mg/dL
LEUKOCYTES UA: NEGATIVE
Nitrite: NEGATIVE
PH: 5 (ref 5.0–8.0)
Protein, ur: NEGATIVE mg/dL
Specific Gravity, Urine: 1.02 (ref 1.005–1.030)
Urobilinogen, UA: 0.2 mg/dL (ref 0.0–1.0)

## 2013-11-24 LAB — CBC
HCT: 28.8 % — ABNORMAL LOW (ref 36.0–46.0)
HEMOGLOBIN: 10.3 g/dL — AB (ref 12.0–15.0)
MCH: 30.8 pg (ref 26.0–34.0)
MCHC: 35.8 g/dL (ref 30.0–36.0)
MCV: 86.2 fL (ref 78.0–100.0)
Platelets: 253 10*3/uL (ref 150–400)
RBC: 3.34 MIL/uL — ABNORMAL LOW (ref 3.87–5.11)
RDW: 16.9 % — ABNORMAL HIGH (ref 11.5–15.5)
WBC: 10.2 10*3/uL (ref 4.0–10.5)

## 2013-11-24 LAB — BASIC METABOLIC PANEL
BUN: 61 mg/dL — ABNORMAL HIGH (ref 6–23)
BUN: 55 mg/dL — ABNORMAL HIGH (ref 6–23)
CO2: 22 mEq/L (ref 19–32)
CO2: 20 mEq/L (ref 19–32)
Calcium: 9.4 mg/dL (ref 8.4–10.5)
Calcium: 9.3 mg/dL (ref 8.4–10.5)
Chloride: 107 mEq/L (ref 96–112)
Chloride: 104 mEq/L (ref 96–112)
Creatinine, Ser: 2.66 mg/dL — ABNORMAL HIGH (ref 0.50–1.10)
Creatinine, Ser: 2.17 mg/dL — ABNORMAL HIGH (ref 0.50–1.10)
GFR calc Af Amer: 17 mL/min — ABNORMAL LOW (ref >90)
GFR calc Af Amer: 22 mL/min — ABNORMAL LOW (ref >90)
GFR calc non Af Amer: 15 mL/min — ABNORMAL LOW (ref >90)
GFR, EST NON AFRICAN AMERICAN: 19 mL/min — AB (ref >90)
Glucose, Bld: 82 mg/dL (ref 70–99)
Glucose, Bld: 240 mg/dL — ABNORMAL HIGH (ref 70–99)
POTASSIUM: 5 meq/L (ref 3.7–5.3)
Potassium: 4.9 mEq/L (ref 3.7–5.3)
SODIUM: 140 meq/L (ref 137–147)
Sodium: 144 mEq/L (ref 137–147)

## 2013-11-24 LAB — CBG MONITORING, ED: Glucose-Capillary: 106 mg/dL — ABNORMAL HIGH (ref 70–99)

## 2013-11-24 MED ORDER — ASPIRIN EC 81 MG PO TBEC
81.0000 mg | DELAYED_RELEASE_TABLET | Freq: Every day | ORAL | Status: DC
  Administered 2013-11-24 – 2013-11-25 (×2): 81 mg via ORAL
  Filled 2013-11-24 (×2): qty 1

## 2013-11-24 MED ORDER — METHYLPREDNISOLONE SODIUM SUCC 125 MG IJ SOLR
125.0000 mg | Freq: Once | INTRAMUSCULAR | Status: AC
  Administered 2013-11-24: 125 mg via INTRAVENOUS
  Filled 2013-11-24: qty 2

## 2013-11-24 MED ORDER — AZITHROMYCIN 250 MG PO TABS
250.0000 mg | ORAL_TABLET | Freq: Every day | ORAL | Status: DC
  Administered 2013-11-25: 250 mg via ORAL
  Filled 2013-11-24: qty 1

## 2013-11-24 MED ORDER — ALBUTEROL SULFATE (2.5 MG/3ML) 0.083% IN NEBU: 2.5000 mg | INHALATION_SOLUTION | RESPIRATORY_TRACT | Status: DC | PRN

## 2013-11-24 MED ORDER — HEPARIN SODIUM (PORCINE) 5000 UNIT/ML IJ SOLN
5000.0000 [IU] | Freq: Three times a day (TID) | INTRAMUSCULAR | Status: DC
  Administered 2013-11-24 – 2013-11-25 (×4): 5000 [IU] via SUBCUTANEOUS
  Filled 2013-11-24 (×4): qty 1

## 2013-11-24 MED ORDER — AZITHROMYCIN 500 MG PO TABS
500.0000 mg | ORAL_TABLET | Freq: Every day | ORAL | Status: AC
  Administered 2013-11-24: 500 mg via ORAL
  Filled 2013-11-24: qty 1

## 2013-11-24 MED ORDER — FERROUS SULFATE 325 (65 FE) MG PO TABS
325.0000 mg | ORAL_TABLET | Freq: Every day | ORAL | Status: DC
  Administered 2013-11-24 – 2013-11-25 (×2): 325 mg via ORAL
  Filled 2013-11-24 (×3): qty 1

## 2013-11-24 MED ORDER — AMLODIPINE BESYLATE 5 MG PO TABS
5.0000 mg | ORAL_TABLET | Freq: Every day | ORAL | Status: DC
  Administered 2013-11-24 – 2013-11-25 (×2): 5 mg via ORAL
  Filled 2013-11-24 (×2): qty 1

## 2013-11-24 MED ORDER — SODIUM CHLORIDE 0.9 % IV SOLN
INTRAVENOUS | Status: DC
  Administered 2013-11-24: 08:00:00 via INTRAVENOUS

## 2013-11-24 MED ORDER — IPRATROPIUM-ALBUTEROL 0.5-2.5 (3) MG/3ML IN SOLN
3.0000 mL | Freq: Once | RESPIRATORY_TRACT | Status: AC
  Administered 2013-11-24: 3 mL via RESPIRATORY_TRACT
  Filled 2013-11-24: qty 3

## 2013-11-24 MED ORDER — OLOPATADINE HCL 0.1 % OP SOLN
1.0000 [drp] | Freq: Two times a day (BID) | OPHTHALMIC | Status: DC
  Administered 2013-11-24 – 2013-11-25 (×3): 1 [drp] via OPHTHALMIC
  Filled 2013-11-24: qty 5

## 2013-11-24 MED ORDER — SODIUM CHLORIDE 0.9 % IV BOLUS (SEPSIS)
1000.0000 mL | Freq: Once | INTRAVENOUS | Status: AC
  Administered 2013-11-24: 1000 mL via INTRAVENOUS

## 2013-11-24 MED ORDER — GUAIFENESIN ER 600 MG PO TB12
600.0000 mg | ORAL_TABLET | Freq: Two times a day (BID) | ORAL | Status: DC
  Administered 2013-11-24 – 2013-11-25 (×3): 600 mg via ORAL
  Filled 2013-11-24 (×4): qty 1

## 2013-11-24 MED ORDER — LORATADINE 10 MG PO TABS
5.0000 mg | ORAL_TABLET | Freq: Every day | ORAL | Status: DC
  Administered 2013-11-24 – 2013-11-25 (×2): 5 mg via ORAL
  Filled 2013-11-24 (×2): qty 0.5

## 2013-11-24 NOTE — Progress Notes (Signed)
Ms. Cansler is doing well this AM.  Denies any current SOB or dyspnea.   O:  Filed Vitals:   11/24/13 0600  BP: 155/58  Pulse: 87  Temp: 97.5 F (36.4 C)  Resp: 18   Gen: NAD  Lung: CTAB Cardio: Irregular, irregular, 2/6 SEM RUSB  Neuro: oriented to person, place, not time.  No focal deficit   A/P:  1) Bronchitis - Continue azithromycin, albuterol, mucinex  2) AKI on CKD - 2/2 volume depletion most likely, daily BMET, fluids  3) Delirium vs Ongoing Dementia - Most likely baseline with slight decline 2/2 dehydration and setting.  Continue to monitor closely, would avoid antipsychotics and anticholinergics.   Dispo: Pending further improvement.  Twana First Paulina Fusi, DO of [facility] 11/24/2013, 9:59 AM

## 2013-11-24 NOTE — ED Provider Notes (Signed)
CSN: [id number]     Arrival date & time 11/23/13  2211 History   First MD Initiated Contact with Patient 11/24/13 0051     Chief Complaint  Patient presents with  . Fever     (Consider location/radiation/quality/duration/timing/severity/associated sxs/prior Treatment) HPI Kathleen [name] is a very pleasant elderly woman with a history of HTN, dementia, CKD and diastolic cardiomyopathy with concentric LVH.   She is BIB her daughter, with whom she lives, for evaluation of cough and wheezing. Patient has had about 2 days of a cough productive of thick mucous. She denies SOB and chest pain. No history of chronic lung disease. No change in LE edema. No change in medications.   Patient states that her po intake has been wnl.    Past Medical History  Diagnosis Date  . Hypertension   . Arthritis   . Spinal stenosis   . Osteoarthritis   . Anemia   . CHF (congestive heart failure)   . Heart murmur     aortic regurg  . Osteoporosis   . Chronic kidney disease   . Generalized OA 12/16/2011    Significant. Seems to have encompass all joints. Patient is complaining of significant pain but dementia allows patient to be happy.  . Dementia   . SunDown syndrome    Past Surgical History  Procedure Laterality Date  . Nephrectomy Right   . Tonsillectomy     Family History  Problem Relation Age of Onset  . Hypertension Daughter    History  Substance Use Topics  . Smoking status: Former Games developer  . Smokeless tobacco: Never Used     Comment: quit smoking in the 70's  . Alcohol Use: No   OB History   Grav Para Term Preterm Abortions TAB SAB Ect Mult Living                 Review of Systems  Ten point review of symptoms performed and is negative with the exception of symptoms noted above.    Allergies  Review of patient's allergies indicates no known allergies.  Home Medications   Prior to Admission medications   Medication Sig Start Date End Date Taking? Authorizing Provider   amLODipine (NORVASC) 5 MG tablet Take 5 mg by mouth daily.   Yes Historical Provider, MD  aspirin 81 MG tablet Take 1 tablet (81 mg total) by mouth daily. 06/01/13  Yes Lonia Skinner, MD  ferrous sulfate (FERROUSUL) 325 (65 FE) MG tablet Take 325 mg by mouth daily.  04/02/12  Yes Zachery Dauer, MD  furosemide (LASIX) 20 MG tablet Take 20 mg by mouth daily.   Yes Historical Provider, MD  guaiFENesin-codeine (ROBITUSSIN AC) 100-10 MG/5ML syrup Take 5 mLs by mouth 3 (three) times daily as needed for cough.   Yes Historical Provider, MD  loratadine (CLARITIN) 10 MG tablet Take 0.5 tablets (5 mg total) by mouth daily. 11/21/13  Yes Josalyn C Funches, MD  sulfamethoxazole-trimethoprim (BACTRIM DS) 800-160 MG per tablet Take 1 tablet by mouth 2 (two) times daily. 11/21/13  Yes Nestor Ramp, MD   BP 162/105  Pulse 96  Temp(Src) 99 F (37.2 C) (Oral)  Resp 19  SpO2 91% Physical Exam Gen: well developed and well nourished appearing Head: NCAT Eyes: PERL, EOMI Nose: no epistaixis or rhinorrhea Mouth/throat: mucosa is moist and pink Neck: supple, no stridor Lungs: S3 rate 24 times per minute, breath sounds mildly diminished bilaterally, scattered wheezing, no respiratory distress CV: RRR, no murmur, extremities appear  well perfused.  Abd: soft, notender, nondistended Back: no ttp, no cva ttp Skin: warm and dry Ext: normal to inspection, no dependent edema Neuro: CN ii-xii grossly intact, no focal deficits Psyche; normal affect,  calm and cooperative.   ED Course  Procedures (including critical care time) Labs Review  Results for orders placed during the hospital encounter of 11/23/13 (from the past 24 hour(s))  I-STAT CG4 LACTIC ACID, ED     Status: None   Collection Time    11/23/13 10:48 PM      Result Value Ref Range   Lactic Acid, Venous 1.14  0.5 - 2.2 mmol/L  CBC     Status: Abnormal   Collection Time    11/24/13  1:20 AM      Result Value Ref Range   WBC 10.2  4.0 - 10.5 K/uL    RBC 3.34 (*) 3.87 - 5.11 MIL/uL   Hemoglobin 10.3 (*) 12.0 - 15.0 g/dL   HCT 56.228.8 (*) 13.036.0 - 86.546.0 %   MCV 86.2  78.0 - 100.0 fL   MCH 30.8  26.0 - 34.0 pg   MCHC 35.8  30.0 - 36.0 g/dL   RDW 78.416.9 (*) 69.611.5 - 29.515.5 %   Platelets 253  150 - 400 K/uL  BASIC METABOLIC PANEL     Status: Abnormal   Collection Time    11/24/13  1:20 AM      Result Value Ref Range   Sodium 144  137 - 147 mEq/L   Potassium 5.0  3.7 - 5.3 mEq/L   Chloride 107  96 - 112 mEq/L   CO2 22  19 - 32 mEq/L   Glucose, Bld 82  70 - 99 mg/dL   BUN 61 (*) 6 - 23 mg/dL   Creatinine, Ser 2.842.66 (*) 0.50 - 1.10 mg/dL   Calcium 9.4  8.4 - 13.210.5 mg/dL   GFR calc non Af Amer 15 (*) >90 mL/min   GFR calc Af Amer 17 (*) >90 mL/min  URINALYSIS, ROUTINE W REFLEX MICROSCOPIC     Status: None   Collection Time    11/24/13  1:32 AM      Result Value Ref Range   Color, Urine YELLOW  YELLOW   APPearance CLEAR  CLEAR   Specific Gravity, Urine 1.020  1.005 - 1.030   pH 5.0  5.0 - 8.0   Glucose, UA NEGATIVE  NEGATIVE mg/dL   Hgb urine dipstick NEGATIVE  NEGATIVE   Bilirubin Urine NEGATIVE  NEGATIVE   Ketones, ur NEGATIVE  NEGATIVE mg/dL   Protein, ur NEGATIVE  NEGATIVE mg/dL   Urobilinogen, UA 0.2  0.0 - 1.0 mg/dL   Nitrite NEGATIVE  NEGATIVE   Leukocytes, UA NEGATIVE  NEGATIVE      Imaging Review Dg Chest 2 View  11/23/2013   CLINICAL DATA:  Fever, lethargy.  EXAM: CHEST  2 VIEW  COMPARISON:  DG CHEST 2 VIEW dated 11/18/2013  FINDINGS: Cardiac silhouette remains moderately enlarged, mediastinal silhouette is nonsuspicious, moderately calcified aortic knob. Minimal linear density at left lung base without pleural effusions or focal consolidations. No pneumothorax. Retrosternal calcified lymph nodes without discrete mass.  Chronic deformity of the right humeral head. May be posttraumatic. Soft tissue planes are nonsuspicious.  IMPRESSION: Stable cardiomegaly, left lung base atelectasis.   Electronically Signed   By: Awilda Metroourtnay  Bloomer    On: 11/23/2013 23:36      MDM   DDX: pneumonia, bronchitis, pleural effusion, viral syndrome, UTI.  Patient with bronchitis and bronchospasm complicated by acute on chronic renal failure with azotemia. We are fluid resuscitating with NS and treating with nebs and steroids. Case discussed with Dr. Consepcion Hearing of the [facility] teaching service. She will admit the patient.     Brandt Loosen, MD 11/24/13 [phone]

## 2013-11-24 NOTE — ED Notes (Signed)
Spoke with this patient's admitting physician and MD is aware of pts HTN and A-fib. Dorann Lodge, RN on [facility] made updated.

## 2013-11-24 NOTE — ED Notes (Signed)
Report attempted 

## 2013-11-24 NOTE — H&P (Signed)
FMTS Attending Admission Note: [name] MD Personal pager:  [phone] FPTS Service Pager:  [phone]  I  have seen and examined this patient, reviewed their chart. I have discussed this patient with the resident. I agree with the resident's findings, assessment and care plan.   Tobey Grim, MD 11/24/2013 11:59 AM

## 2013-11-24 NOTE — H&P (Signed)
Family Medicine Teaching [facility] Admission History and Physical Service Pager: [phone]  Patient name: Kathleen [name] Medical record number: [id number] Date of birth: [birth year] Age: 80 y.o. Gender: female  Primary Care Provider: Denny Levy, MD Consultants: none Code Status: DNR (discussed with daughter Abundio Miu upon admission)  Chief Complaint: cough  Assessment and Plan: Kathleen [name] is a 80 y.o. female presenting with cough and confusion, found to have AKI. PMH is significant for dementia, CKD, paroxysmal afib (not on anticoag, just aspirin), CHF, HTN.  # Cough/Congestion: likely viral vs atypical bronchitis. No PNA on CXR but does have focal area of crackles present in LLL. Had been taking bactrim as outpatient. No current hypoxia, generally clinically well appearing. - admit to telemetry, monitor vitals per unit policy - azithromycin 500mg  today then 250mg  daily x 4 days - O2 supplementation as needed - albuterol nebs q4h prn - s/p solumedrol in ER, hold on further steroids at this time - mucinex BID - doubt pt would be able to participate in incentive spirometry due to baseline dementia.  # Acute on Chronic Kidney Disease: Cr elevated at 2.66 from baseline (1.3-1.5). Likely secondary to volume depletion, although pt not significantly dry upon clinical exam. Lactate WNL. - hydrate with IVF. Need to be cautious so as to not fluid overload given hx of CHF. Last EF 55-65% in Nov 2014. - Monitor fluid status closely - trend BMET - if not improved with fluids will likely need to obtain urine sodium & creatinine to calculate FENa. - monitor I&O  # Delirium: mildly delirious/more confused than normal according to family - frequent reorientation - avoid anticholinergic medications  # Watery eyes: likely allergic conjunctivitis - continue home claritin, add patanol eye drops  # Paroxysmal afib: according to epic, on just aspirin daily due to fall risk. No  current RVR. - monitor HR. Continue aspirin.  FEN/GI: NS bolus 1L ordered by ER, then will gently hydrate with NS @ 50cc/hr.  Prophylaxis: SQ heparin  Disposition: pending clinical improvement  History of Present Illness: Kathleen [name] is a 80 y.o. female presenting with cough, confusion, and low grade fever at home.  She is accompanied by her daughter, Abundio Miu, and her granddaughter, who provide much of the history due to pt's dementia. They report that for the last two weeks she has had a cough, and this has worsened with increased congestion. She has been seen twice for this at the [facility] over the past few weeks. She has had two chest xrays prior to today, which did not show a pneumonia. Her PCP apparently called in some codeine syrup and Bactrim for her the other day since she was not improving. On Friday evening she went to sleep, and woke up at 4pm on Saturday, having slept that entire time. She got a dose of the codeine syrup yesterday morning and started to act "woozy" and more confused than normal. Her family became concerned that she had a fever, so they checked her temp and got 100.1 axillary, followed by 100.2 axillary some time later. She had not had a fever prior to today. Pt denies any chest pain or shortness of breath. Her family has noted frequent watering of her eyes, and occasional thick material in her eyes as well.  Of note she was treated with a short course of lasix a couple of weeks ago, due to lower extremity edema. She's not had any lasix in the last 2 weeks, and the swelling has not  returned. She's been eating and drinking well. Has reportedly not had a bowel movement since Friday at 3pm.  She is demented and nonambulatory at her baseline. Usually oriented only to person and place.  Review Of Systems: Per HPI otherwise negative   Patient Active Problem List   Diagnosis Date Noted  . Possible pulmonary mass 11/22/2013  . Viral URI with cough 11/18/2013  .  Bilateral lower extremity edema 10/25/2013  . Right leg pain 10/25/2013  . Acute encephalopathy 09/17/2013  . Altered mental status 09/16/2013  . Hip pain, bilateral 08/07/2013  . Unintentional weight loss 06/13/2013  . Agitation 06/12/2013  . Difficulty walking 03/06/2013  . 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 Body temperature low 02/18/2013  . Irregular bowel habits 11/08/2012  . At high risk for falls 10/02/2012  . Anemia 02/24/2012  . Generalized OA 12/16/2011  . Unspecified conditions influencing health status 12/16/2011  . Dementia 10/13/2010  . Paroxysmal atrial fibrillation 10/13/2010  . Hypertension 10/13/2010  . Kidney disease, chronic, stage III (GFR 30-59 ml/min) 10/13/2010  . Osteoporosis 10/13/2010  . CHF (congestive heart failure) 10/13/2010   Past Medical History: Past Medical History  Diagnosis Date  . Hypertension   . Arthritis   . Spinal stenosis   . Osteoarthritis   . Anemia   . CHF (congestive heart failure)   . Heart murmur     aortic regurg  . Osteoporosis   . Chronic kidney disease   . Generalized OA 12/16/2011    Significant. Seems to have encompass all joints. Patient is complaining of significant pain but dementia allows patient to be happy.  . Dementia   . SunDown syndrome    Past Surgical History: Past Surgical History  Procedure Laterality Date  . Nephrectomy Right   . Tonsillectomy     Social History: History  Substance Use Topics  . Smoking status: Former Games developer  . Smokeless tobacco: Never Used     Comment: quit smoking in the 70's  . Alcohol Use: No   Additional social history: mother of Abundio Miu, who works in [employer]  Please also refer to relevant sections of EMR.  Family History: Family History  Problem Relation Age of Onset  . Hypertension Daughter    Allergies and Medications: No Known Allergies No current facility-administered medications on file prior to encounter.    Current Outpatient Prescriptions on File Prior to Encounter  Medication Sig Dispense Refill  . amLODipine (NORVASC) 5 MG tablet Take 5 mg by mouth daily.      Marland Kitchen aspirin 81 MG tablet Take 1 tablet (81 mg total) by mouth daily.  30 tablet  3  . ferrous sulfate (FERROUSUL) 325 (65 FE) MG tablet Take 325 mg by mouth daily.       Marland Kitchen loratadine (CLARITIN) 10 MG tablet Take 0.5 tablets (5 mg total) by mouth daily.  15 tablet  1  . sulfamethoxazole-trimethoprim (BACTRIM DS) 800-160 MG per tablet Take 1 tablet by mouth 2 (two) times daily.  14 tablet  0    Objective: BP 162/105  Pulse 96  Temp(Src) 99 F (37.2 C) (Oral)  Resp 19  SpO2 95% Exam: General: NAD, sitting up in bed, family at bedside HEENT: NCAT, sclera with some injection and watering bilaterally, no perilimbal redness, PERRL, bilat TM's obscured by cerumen impaction Cardiovascular: irregularly irregular rhythm, normal rate, 2/6 systolic murmur loudest at LUSB Respiratory: diminished breath sounds throughout. Focal area of crackles over LLL base. No  wheezes. Mildly increased WOB with frequent cough Abdomen: nontender to palpation Extremities: No appreciable lower extremity edema bilaterally. 2+ DP pulses bilaterally Skin: no rashes noted Neuro: oriented to person, knows she is in the hospital, unable to say today's date or her birthday. Speech intact. Tremulous (at baseline per family)  Labs and Imaging: CBC BMET   Recent Labs Lab 11/24/13 0120  WBC 10.2  HGB 10.3*  HCT 28.8*  PLT 253    Recent Labs Lab 11/24/13 0120  NA 144  K 5.0  CL 107  CO2 22  BUN 61*  CREATININE 2.66*  GLUCOSE 82  CALCIUM 9.4     CXR Stable cardiomegaly, left lung base atelectasis.   Lactate normal  Urinalysis    Component Value Date/Time   COLORURINE YELLOW 11/24/2013 0132   APPEARANCEUR CLEAR 11/24/2013 0132   LABSPEC 1.020 11/24/2013 0132   PHURINE 5.0 11/24/2013 0132   GLUCOSEU NEGATIVE 11/24/2013 0132   HGBUR NEGATIVE  11/24/2013 0132   HGBUR neg 02/18/2013   BILIRUBINUR NEGATIVE 11/24/2013 0132   BILIRUBINUR NEG 09/12/2013 1056   KETONESUR NEGATIVE 11/24/2013 0132   PROTEINUR NEGATIVE 11/24/2013 0132   PROTEINUR NEG 09/12/2013 1056   UROBILINOGEN 0.2 11/24/2013 0132   UROBILINOGEN 0.2 09/12/2013 1056   NITRITE NEGATIVE 11/24/2013 0132   NITRITE NEG 09/12/2013 1056   LEUKOCYTESUR NEGATIVE 11/24/2013 0132   LEUKOCYTESUR Trace* 02/18/2013       Latrelle DodrillBrittany J Donalda Job, MD 11/24/2013, 2:35 AM PGY-2, [facility] FPTS Intern pager: [phone], text pages welcome

## 2013-11-24 NOTE — ED Notes (Signed)
Dr McIntyre at bedside

## 2013-11-25 LAB — BASIC METABOLIC PANEL
BUN: 53 mg/dL — ABNORMAL HIGH (ref 6–23)
CO2: 21 mEq/L (ref 19–32)
Calcium: 9.4 mg/dL (ref 8.4–10.5)
Chloride: 106 mEq/L (ref 96–112)
Creatinine, Ser: 1.85 mg/dL — ABNORMAL HIGH (ref 0.50–1.10)
GFR, EST AFRICAN AMERICAN: 27 mL/min — AB (ref >90)
GFR, EST NON AFRICAN AMERICAN: 23 mL/min — AB (ref >90)
Glucose, Bld: 107 mg/dL — ABNORMAL HIGH (ref 70–99)
POTASSIUM: 4.8 meq/L (ref 3.7–5.3)
SODIUM: 142 meq/L (ref 137–147)

## 2013-11-25 MED ORDER — AZITHROMYCIN 250 MG PO TABS: 250.0000 mg | ORAL_TABLET | Freq: Every day | ORAL | Status: DC

## 2013-11-25 MED ORDER — OLOPATADINE HCL 0.2 % OP SOLN: 1.0000 [drp] | Freq: Two times a day (BID) | OPHTHALMIC | Status: DC

## 2013-11-25 MED ORDER — GUAIFENESIN ER 600 MG PO TB12: 600.0000 mg | ORAL_TABLET | Freq: Two times a day (BID) | ORAL | Status: DC

## 2013-11-25 NOTE — Evaluation (Signed)
Occupational Therapy Evaluation Patient Details Name: Kathleen [name] MRN: [id number] DOB: 1923-03-15 Today's Date: 11/25/2013    History of Present Illness Came to ED for cough with AMS on 11/23/13. +AKI on CKD PMHx- dementia; CHF; afib (only on aspirin); HTN   Clinical Impression   Pt up to [facility] with assist. Family member present and interested in OT to try to decrease caregiver burden and also interested in pt trying adaptive utensils. Will see pt for OT to work on above performance areas and increase independence for d/c home with family.    Follow Up Recommendations  Home health OT;Supervision/Assistance - 24 hour    Equipment Recommendations  None recommended by OT    Recommendations for Other Services       Precautions / Restrictions Precautions Precautions: Fall Restrictions Weight Bearing Restrictions: No      Mobility Bed Mobility Overal bed mobility: Needs Assistance Bed Mobility: Rolling;Supine to Sit;Sit to Supine Rolling: Min assist   Supine to sit: Min assist;HOB elevated Sit to supine: Mod assist   General bed mobility comments: able to come up to sitting with min assist to facilitate scooting forward. More assist for sit to supine to help guide shoulders and bring LEs up onto bed. Use of bed pad to scoot pt up in bed. Able to scoot hips herself to midline with cues.  Transfers Overall transfer level: Needs assistance Equipment used: Rolling walker (2 wheeled) Transfers: Sit to/from Stand Sit to Stand: Min assist Stand pivot transfers: Mod assist       General transfer comment: cues for hand placement. assist to rise and steady as well as guide to sitting. tends to be forward flexed with posture.    Balance Overall balance assessment: Needs assistance Sitting-balance support: No upper extremity supported;Feet supported Sitting balance-Leahy Scale: Fair     Standing balance support: Bilateral upper extremity supported Standing balance-Leahy  Scale: Poor                              ADL Overall ADL's : Needs assistance/impaired Eating/Feeding: Moderate assistance;Bed level   Grooming: Wash/dry face;Minimal assistance;Bed level   Upper Body Bathing: Maximal assistance;Sitting   Lower Body Bathing: Total assistance;Sit to/from stand   Upper Body Dressing : Moderate assistance;Sitting   Lower Body Dressing: Total assistance;Sit to/from stand   Toilet Transfer: Moderate assistance;RW;Stand-pivot   Toileting- Clothing Manipulation and Hygiene: Total assistance;Sit to/from stand         General ADL Comments: Granddaughter present for session and provided history. She is interested in trying red foam grip for pt's utensil to see if she can try to feed herself again. Pt usually pivots to [facility] without device and caregiver or family assist. She tends to be forward flexed and needs assist and cues to try to stand more erect.      Vision                     Perception     Praxis      Pertinent Vitals/Pain No complaint of     Hand Dominance     Extremity/Trunk Assessment Upper Extremity Assessment Upper Extremity Assessment: Generalized weakness;RUE deficits/detail;LUE deficits/detail RUE Deficits / Details: arthritic changes in hands, shoulders, per family difficulty gripping utensil LUE Deficits / Details: arthritic changes in hands, shoulders   Lower Extremity Assessment Lower Extremity Assessment: Generalized weakness;RLE deficits/detail;LLE deficits/detail RLE Deficits / Details: palpable crepitus in hips in weightbearing LLE  Deficits / Details: palpable crepitus in hips in weightbearing       Communication Communication Communication: No difficulties   Cognition Arousal/Alertness: Awake/alert Behavior During Therapy: WFL for tasks assessed/performed Overall Cognitive Status: History of cognitive impairments - at baseline                     General Comments        Exercises       Shoulder Instructions      Home Living Family/patient expects to be discharged to:: Private residence Living Arrangements: Children;Other (Comment);Other relatives (caregiver  in am till 3 pm when family comes back home) Available Help at Discharge: Family;Personal care attendant         Home Layout: One level               Home Equipment: Wheelchair - [employer]manual;Walker - 2 wheels;Bedside commode   Additional Comments: plan for d/c unclear; info above from prior admission as no family present during evaluation      Prior Functioning/Environment Level of Independence: Needs assistance  Gait / Transfers Assistance Needed: uses WC, assist to [facility] AD[facility]'s / Homemaking Assistance Needed: A with bathing and dressing, grooming and feeding by family and caregiver. Pivots to [facility] only without device.   Comments: no family present; info above from prior admission    OT Diagnosis: Generalized weakness   OT Problem List: Decreased strength;Decreased knowledge of use of DME or AE   OT Treatment/Interventions: Self-care/ADL training;Patient/family education;Therapeutic activities;DME and/or AE instruction    OT Goals(Current goals can be found in the care plan section) Acute Rehab OT Goals Patient Stated Goal: agrees to increasing strength and independence. OT Goal Formulation: With patient/family Time For Goal Achievement: 12/09/13 Potential to Achieve Goals: Good  OT Frequency: Min 2X/week   Barriers to D/C:            Co-evaluation              End of Session Equipment Utilized During Treatment: Gait belt;Rolling walker  Activity Tolerance: Patient tolerated treatment well Patient left: in bed;with call bell/phone within reach;with family/visitor present;with bed alarm set   Time: 1191-47821007-1041 OT Time Calculation (min): 34 min Charges:  OT General Charges $OT Visit: 1 Procedure OT Evaluation $Initial OT Evaluation Tier I: 1 Procedure OT  Treatments $Self Care/Home Management : 8-22 mins $Therapeutic Activity: 8-22 mins G-Codes:    Sabino GasserStephanie Stafford Idelia Caudell [phone] 11/25/2013, 11:03 AM

## 2013-11-25 NOTE — Progress Notes (Signed)
Clinical Social Work Department BRIEF PSYCHOSOCIAL ASSESSMENT 11/25/2013  Patient:  Kathleen [name], Kathleen [name]     Account Number:  [id number]     Admit date:  11/23/2013  Clinical Social Worker:  Valda Lamb  Date/Time:  11/25/2013 09:52 AM  Referred by:  Physician  Date Referred:  11/25/2013 Referred for  SNF Placement   Other Referral:   Interview type:  Family Other interview type:   CSW completed assessment with pt daughter Kathleen [name] at the [facility] clinic where daughter works.    PSYCHOSOCIAL DATA Living Status:  FAMILY Admitted from facility:   Level of care:   Primary support name:  Kathleen [name] [phone] Primary support relationship to patient:  CHILD, ADULT Degree of support available:   Pt has an aide through [employer]. Pt lives with daughter and grand daughter who provide care for the pt.    CURRENT CONCERNS Current Concerns  Post-Acute Placement   Other Concerns:    SOCIAL WORK ASSESSMENT / PLAN Family Medicine CSW met with pt daughter Kathleen [name] at CSW's office in clinic.    Daughter informed CSW that pt was admitted into the hospital this weekend and the plan will be for the pt to return home at discharge. CSW explored SNF placement (though pt is on observation and Medicare would not cover SNF placement) with pt Medicaid. Daughter states pt has been in a facility in the past however would prefer pt return home with support systems that she has in place.    CSW and daughter also discussed palliative care/end of life as daughter openly shared her wishes for pt to be a DNR. Daughter stated she had this coversation with Dr. Ardelia Mems this weekend and pt PCP aware of wishes. CSW informed daughter of the option to have a palliative care meeting now or in the future if she ever desires, daughter appreciative but declined at this time.    Family Medicine CSW has notified Unit CSW Kendell Bane, [place] about daughters wishes to have  pt return home at dc.    CSW signing off.   Assessment/plan status:  No Further Intervention Required Other assessment/ plan:   Information/referral to community resources:   Family Medicine CSW available to provide resources - at this time none are needed.    CSW did inform daughter of the purpose of a palliative care meeting if this was something she was interested in.    PATIENT'S/FAMILY'S RESPONSE TO PLAN OF CARE: Pt presents with delirium and dementia. Assessment completed with pt daughter.    No further needs at this time. CSW signing off.    Hunt Oris, MSW, [place]    Hunt Oris, [place], [place]

## 2013-11-25 NOTE — Progress Notes (Signed)
UR completed 

## 2013-11-25 NOTE — Progress Notes (Signed)
FMTS Attending Note Patient seen and examined by me today, discussed with resident team and I agree with Dr Laban Emperor assessment and plan as documented. Patient is comfortable-appearing and is not on supplemental oxygen.  Occasional mild cough during exam/interview. Denies pain or dyspnea. Is oriented to "[place]" only.  Plan for continued supportive care; to arrange disposition as per conversations with family and CSW. Paula Compton, MD

## 2013-11-25 NOTE — Evaluation (Signed)
Physical Therapy Evaluation Patient Details Name: Kathleen PancakeMartha M [name] MRN: [id number] DOB: August 07, 1922 Today's Date: 11/25/2013   History of Present Illness  Came to ED for cough with AMS on 11/23/13. +AKI on CKD PMHx- dementia; CHF; afib (only on aspirin); HTN  Clinical Impression  Pt admitted with AMS and AKI. Pt currently with functional limitations due to the deficits listed below (see PT Problem List). Unclear what pt's baseline mobility is and whether family can provide 24 hr care for pt. Pt will benefit from a trial of skilled PT to increase their independence and safety with mobility to allow discharge to the venue listed below (if family cannot provide care at home).     Follow Up Recommendations SNF;Supervision/Assistance - 24 hour    Equipment Recommendations  None recommended by PT    Recommendations for Other Services OT consult     Precautions / Restrictions Precautions Precautions: Fall Restrictions Weight Bearing Restrictions: No      Mobility  Bed Mobility Overal bed mobility: Needs Assistance Bed Mobility: Rolling;Supine to Sit;Sit to Supine Rolling: Supervision   Supine to sit: Min assist;HOB elevated Sit to supine: Mod assist   General bed mobility comments: pt able to pull to long-sitting without assist; moves legs over EOB, assist to scoot to put feet on floor; attempted sit to side with pt resisting and prefers sit to supine   Transfers Overall transfer level: Needs assistance Equipment used: Rolling walker (2 wheeled) Transfers: Sit to/from [employer]Stand;Stand Pivot Transfers Sit to Stand: Min assist Stand pivot transfers: Min assist       General transfer comment: assist to anchor RW as pt pulls up to stand; flexed posture  Ambulation/Gait Ambulation/Gait assistance: Min assist Ambulation Distance (Feet): 3 Feet Assistive device: Rolling walker (2 wheeled) Gait Pattern/deviations: Step-to pattern;Decreased stride length;Shuffle;Trunk flexed;Narrow  base of support     General Gait Details: anticipate pt only walks short distances due to severe arthritis  Stairs            Wheelchair Mobility    Modified Rankin (Stroke Patients Only)       Balance Overall balance assessment: Needs assistance Sitting-balance support: No upper extremity supported;Feet supported Sitting balance-Leahy Scale: Fair     Standing balance support: Bilateral upper extremity supported Standing balance-Leahy Scale: Poor                               Pertinent Vitals/Pain No indications of pain; no apparent distress     Home Living Family/patient expects to be discharged to:: Other (Comment) Living Arrangements: Children Available Help at Discharge: Family;Personal care attendant           Home Equipment: Wheelchair - Economistmanual;Shower seat;Walker - 2 wheels Additional Comments: plan for d/c unclear; info above from prior admission as no family present during evaluation    Prior Function Level of Independence: Needs assistance   Gait / Transfers Assistance Needed: uses WC, [facility]  ADL's / Homemaking Assistance Needed: A with bathing and dressing  Comments: no family present; info above from prior admission     Hand Dominance        Extremity/Trunk Assessment   Upper Extremity Assessment: RUE deficits/detail;LUE deficits/detail;Generalized weakness RUE Deficits / Details: arthritic changes in hands, shoulders     LUE Deficits / Details: arthritic changes in hands, shoulders   Lower Extremity Assessment: Generalized weakness;RLE deficits/detail;LLE deficits/detail RLE Deficits / Details: palpable crepitus in hips in  weightbearing LLE Deficits / Details: palpable crepitus in hips in weightbearing     Communication   Communication: No difficulties  Cognition Arousal/Alertness: Awake/alert Behavior During Therapy: WFL for tasks assessed/performed Overall Cognitive Status: No family/caregiver  present to determine baseline cognitive functioning (h/o dementia; oriented to self, [facility])                      General Comments      Exercises        Assessment/Plan    PT Assessment Patient needs continued PT services (trial of PT until baseline established)  PT Diagnosis Difficulty walking;Generalized weakness   PT Problem List Decreased strength;Decreased range of motion;Decreased balance;Decreased mobility;Decreased cognition;Decreased knowledge of use of DME;Decreased safety awareness;Decreased knowledge of precautions  PT Treatment Interventions DME instruction;Gait training;Functional mobility training;Therapeutic activities;Therapeutic exercise;Balance training;Cognitive remediation;Patient/family education   PT Goals (Current goals can be found in the Care Plan section) Acute Rehab PT Goals Patient Stated Goal: agrees to increasing strength, mobility PT Goal Formulation: Patient unable to participate in goal setting Time For Goal Achievement: 12/09/13 Potential to Achieve Goals: Good    Frequency Min 2X/week   Barriers to discharge        Co-evaluation               End of Session Equipment Utilized During Treatment: Gait belt Activity Tolerance: Patient tolerated treatment well Patient left: in bed;with call bell/phone within reach;with bed alarm set Nurse Communication: Mobility status;Other (comment) (incontinent in bed and turning to [facility])    Functional Assessment Tool Used: clinical judgement Functional Limitation: Mobility: Walking and moving around Mobility: Walking and Moving Around Current Status ([id number]): At least 20 percent but less than 40 percent impaired, limited or restricted Mobility: Walking and Moving Around Goal Status [phone]): At least 1 percent but less than 20 percent impaired, limited or restricted    Time: 0839-0904 PT Time Calculation (min): 25 min   Charges:   PT Evaluation $Initial PT Evaluation Tier I: 1  Procedure PT Treatments $Therapeutic Activity: 23-37 mins   PT G Codes:   Functional Assessment Tool Used: clinical judgement Functional Limitation: Mobility: Walking and moving around    [employer] 11/25/2013, 9:32 AM Pager [phone]

## 2013-11-25 NOTE — Care Management Note (Addendum)
  Page 1 of 1   11/25/2013     1:49:50 PM CARE MANAGEMENT NOTE 11/25/2013  Patient:  Kathleen [name], Kathleen [name]   Account Number:  [id number]  Date Initiated:  11/25/2013  Documentation initiated by:  Donato Schultz  Subjective/Objective Assessment:   Admitted with Bronchitis     Action/Plan:   CM to follow for dispositon needs   Anticipated DC Date:  11/27/2013   Anticipated DC Plan:  HOME/SELF CARE         Choice offered to / List presented to:             Status of service:  Completed, signed off Medicare Important Message given?  YES (If response is "NO", the following Medicare IM given date fields will be blank) Date Medicare IM given:  11/23/2013 Date Additional Medicare IM given:    Discharge Disposition:    Per UR Regulation:  Reviewed for med. necessity/level of care/duration of stay  If discussed at Long Length of Stay Meetings, dates discussed:    Comments:  Antonio Woodhams RN, BSN, MSHL, CCM  Nurse - Case Manager, (Unit [facility])  [phone]  11/25/2013 Hx/o initial IM provided 11/23/2013 / 10:12:21 PM

## 2013-11-25 NOTE — Progress Notes (Signed)
Patient evaluated for community based chronic disease management services with [facility] Care Management Program as a benefit of patient's [employer]. Spoke with patient and grand daughter at bedside to explain [facility] Care Management services.  Grand daughter would like her mother who is the HCPOA to review the [facility] literature and follow up with Clinical research associate if she desires services or additional information.  Left contact information and [facility] literature at bedside. Made Inpatient Case Manager aware that [facility] Care Management following. Of note, [facility] Care Management services does not replace or interfere with any services that are arranged by inpatient case management or social work.  For additional questions or referrals please contact Anibal Henderson BSN RN [facility] Liaison at [phone].

## 2013-11-25 NOTE — Progress Notes (Signed)
Family Medicine Teaching Service Daily Progress Note Intern Pager: [phone]  Patient name: Kathleen [name] Medical record number: [id number] Date of birth: Jan 18, 1923 Age: 80 y.o. Gender: female  Primary Care Provider: Denny Levy, MD Consultants: none Code Status: DNR  Pt Overview and Major Events to Date:  5/17: azithromycin  Assessment and Plan: Kathleen [name] is a 80 y.o. female presenting with cough and confusion, found to have AKI. PMH is significant for dementia, CKD, paroxysmal afib (not on anticoag, just aspirin), CHF, HTN.   # Cough/Congestion: likely viral vs atypical bronchitis. No PNA on CXR but does have focal area of crackles present in LLL. Had been taking bactrim as outpatient. No current hypoxia, generally clinically well appearing.  - azithromycin (5/17>>) - O2 supplementation as needed  - albuterol nebs q4h prn - mucinex BID   # Acute on Chronic Kidney Disease: Cr elevated at 2.66 from baseline (1.3-1.5). Likely secondary to volume depletion, although pt not significantly dry upon clinical exam. Lactate WNL.  - Monitor fluid status closely  - trend BMET, creatinine trending down - monitor I&O   # Delirium: mildly delirious/more confused than normal according to family  - frequent reorientation  - avoid anticholinergic medications   # Watery eyes: likely allergic conjunctivitis  - continue home claritin, add patanol eye drops  # Paroxysmal afib: according to epic, on just aspirin daily due to fall risk. No current RVR.  - monitor HR. Continue aspirin.   FEN/GI: saline lock, regular diet Prophylaxis: SQ heparin  Disposition: pending clinical improvement  Subjective:  Complains of "cold" this morning, has continued cough. Denies CP, SOB, nausea/vomiting, diarrhea or constipation.  Objective: Temp:  [97.3 F (36.3 C)-97.8 F (36.6 C)] 97.8 F (36.6 C) (05/18 0145) Pulse Rate:  [62-95] 66 (05/18 0145) Resp:  [18] 18 (05/18 0145) BP:  (138-162)/(67-96) 152/86 mmHg (05/18 0145) SpO2:  [97 %-100 %] 100 % (05/18 0145) Physical Exam: General: NAD, eating breakfast with assistance Cardiovascular: irreg irreg, 2/6 systolic murmur Respiratory: distant breath sounds, rhonchi and wheeze present bilaterally Abdomen: soft, obese, nontender/nondistended, normal bowel sounds Extremities: no edema, WWP Neuro: alert, oriented to person/place but not time (1988). Grip strength 5/5 bilaterally, no focal deficits.  Laboratory:  Recent Labs Lab 11/24/13 0120  WBC 10.2  HGB 10.3*  HCT 28.8*  PLT 253    Recent Labs Lab 11/24/13 0120 11/24/13 1641 11/25/13 0415  NA 144 140 142  K 5.0 4.9 4.8  CL 107 104 106  CO2 22 20 21   BUN 61* 55* 53*  CREATININE 2.66* 2.17* 1.85*  CALCIUM 9.4 9.3 9.4  GLUCOSE 82 240* 107*   i-stat 1.14  Imaging/Diagnostic Tests: Dg Chest 2 View  11/23/2013   CLINICAL DATA:  Fever, lethargy.  EXAM: CHEST  2 VIEW  COMPARISON:  DG CHEST 2 VIEW dated 11/18/2013  FINDINGS: Cardiac silhouette remains moderately enlarged, mediastinal silhouette is nonsuspicious, moderately calcified aortic knob. Minimal linear density at left lung base without pleural effusions or focal consolidations. No pneumothorax. Retrosternal calcified lymph nodes without discrete mass.  Chronic deformity of the right humeral head. May be posttraumatic. Soft tissue planes are nonsuspicious.   IMPRESSION: Stable cardiomegaly, left lung base atelectasis.   Electronically Signed   By: Awilda Metro   On: 11/23/2013 23:36    Tawni Carnes, MD 11/25/2013, 7:22 AM PGY-1, [facility] Family Medicine FPTS Intern pager: [phone], text pages welcome

## 2013-11-25 NOTE — Discharge Instructions (Signed)
Kathleen [name] was admitted for cough and bronchitis. She was started on Azithromycin, which needs to be taken for the next 3 days.  For her cough, you can continue using guaifenesin (prescription sent or you can buy this OTC). Continue having her drink as much water as she can to help keep her hydrated and loosen the mucous.  No other changes were made to her medications.

## 2013-11-25 NOTE — Progress Notes (Signed)
Patient is currently active with [facility] services and has been recommended to discharge to SNF.  Family will notify [facility] if continued services are requested.  Of note, [facility] Care Management services does not replace or interfere with any services that are arranged by inpatient case management or social work.  For additional questions or referrals please contact Anibal Henderson BSN RN [facility] Liaison at [phone].

## 2013-11-25 NOTE — Progress Notes (Signed)
11/25/13 1103  OT G-codes **NOT FOR INPATIENT CLASS**  Functional Assessment Tool Used clinical judgement  Functional Limitation Self care  Self Care Current Status [phone]) CL  Self Care Goal Status [phone]) CJ  11/25/2013  Judithann Sauger OTR/L [phone] 11/25/2013

## 2013-11-30 NOTE — Discharge Summary (Signed)
Family Medicine Teaching [facility] Discharge Summary  Patient name: Kathleen [name] Medical record number: [id number] Date of birth: September 02, 1922 Age: 80 y.o. Gender: female Date of Admission: 11/23/2013  Date of Discharge: 11/25/2013 Admitting Physician: Tobey Grim, MD  Primary Care Provider: Denny Levy, MD Consultants: none  Indication for Hospitalization: Cough  Discharge Diagnoses/Problem List:  Community acquired pneumonia AKI, improved Dementia  CKD stage 3B  History of paroxysmal Afib  Disposition: home  Discharge Condition: stable, improved  Brief Hospital Course:  Kathleen [name] is a 80 y.o. female admitted for cough and confusion, also found to have AKI. PMH significant for dementia, CKD, history of PAF. On admission vitals were stable with no oxygen requirement, report of mild fever at home but not in ED, lab workup showing WBC 10.2, an AKI with Cr 2.66 (baseline 1.3-1.5), i-stat lactic acid 1.14, UA with no evidence of infection, CXR with no evidence of pneumonia but patient with focal crackles in LLL on exam. Patient previously on bactrim as outpatient. She was started on azithromycin and gentle IV hydration and improved by the morning. She was again monitored overnight and was still without any oxygen requirement. She was discharged with continued course of azithromycin  Issues for Follow Up:  1. PT/OT: seen by PT and OT and recommending SNF, however patient's family declined this as well as home health services. If needed and requested recommend ordering HH PT and OT as outpatient.  Significant Procedures: none  Significant Labs and Imaging:   Recent Labs Lab 11/24/13 0120  WBC 10.2  HGB 10.3*  HCT 28.8*  PLT 253    Recent Labs Lab 11/24/13 0120 11/24/13 1641 11/25/13 0415  NA 144 140 142  K 5.0 4.9 4.8  CL 107 104 106  CO2 22 20 21   GLUCOSE 82 240* 107*  BUN 61* 55* 53*  CREATININE 2.66* 2.17* 1.85*  CALCIUM 9.4 9.3 9.4   Dg Chest  2 View  11/23/2013   CLINICAL DATA:  Fever, lethargy.  EXAM: CHEST  2 VIEW  COMPARISON:  DG CHEST 2 VIEW dated 11/18/2013  FINDINGS: Cardiac silhouette remains moderately enlarged, mediastinal silhouette is nonsuspicious, moderately calcified aortic knob. Minimal linear density at left lung base without pleural effusions or focal consolidations. No pneumothorax. Retrosternal calcified lymph nodes without discrete mass.  Chronic deformity of the right humeral head. May be posttraumatic. Soft tissue planes are nonsuspicious.  IMPRESSION: Stable cardiomegaly, left lung base atelectasis.   Electronically Signed   By: Awilda Metro   On: 11/23/2013 23:36   Results/Tests Pending at Time of Discharge: none  Discharge Medications:    Medication List    STOP taking these medications       guaiFENesin-codeine 100-10 MG/5ML syrup  Commonly known as:  ROBITUSSIN AC      TAKE these medications       amLODipine 5 MG tablet  Commonly known as:  NORVASC  Take 5 mg by mouth daily.     aspirin 81 MG tablet  Take 1 tablet (81 mg total) by mouth daily.     azithromycin 250 MG tablet  Commonly known as:  ZITHROMAX  Take 1 tablet (250 mg total) by mouth daily.     FERROUSUL 325 (65 FE) MG tablet  Generic drug:  ferrous sulfate  Take 325 mg by mouth daily.     furosemide 20 MG tablet  Commonly known as:  LASIX  Take 20 mg by mouth daily.     guaiFENesin 600  MG 12 hr tablet  Commonly known as:  MUCINEX  Take 1 tablet (600 mg total) by mouth 2 (two) times daily.     loratadine 10 MG tablet  Commonly known as:  CLARITIN  Take 0.5 tablets (5 mg total) by mouth daily.     Olopatadine HCl 0.2 % Soln  Commonly known as:  PATADAY  Apply 1 drop to eye 2 (two) times daily.     sulfamethoxazole-trimethoprim 800-160 MG per tablet  Commonly known as:  BACTRIM DS  Take 1 tablet by mouth 2 (two) times daily.        Discharge Instructions: Please refer to Patient Instructions section of EMR for  full details.  Patient was counseled important signs and symptoms that should prompt return to medical care, changes in medications, dietary instructions, activity restrictions, and follow up appointments.   Follow-Up Appointments: PCP in 1 week  Tawni CarnesAndrew Laquitha Heslin, MD 11/30/2013, 10:57 PM PGY-1, [facility]

## 2014-01-25 ENCOUNTER — Other Ambulatory Visit: Payer: Self-pay | Admitting: Family Medicine

## 2014-02-28 ENCOUNTER — Other Ambulatory Visit (HOSPITAL_COMMUNITY): Payer: Self-pay | Admitting: Family Medicine

## 2014-04-22 ENCOUNTER — Telehealth: Payer: Self-pay | Admitting: Family Medicine

## 2014-04-22 NOTE — Telephone Encounter (Signed)
Form placed in PCP

## 2014-04-22 NOTE — Telephone Encounter (Signed)
Toniann Fail her PCA brought in forms to be completed

## 2014-04-23 ENCOUNTER — Encounter: Payer: Self-pay | Admitting: Family Medicine

## 2014-04-23 ENCOUNTER — Other Ambulatory Visit (INDEPENDENT_AMBULATORY_CARE_PROVIDER_SITE_OTHER): Payer: Medicare Other

## 2014-04-23 DIAGNOSIS — R300 Dysuria: Secondary | ICD-10-CM

## 2014-04-23 LAB — POCT URINALYSIS DIPSTICK
Bilirubin, UA: NEGATIVE
GLUCOSE UA: NEGATIVE
Ketones, UA: NEGATIVE
NITRITE UA: NEGATIVE
Protein, UA: 30
Spec Grav, UA: <=1.005
UROBILINOGEN UA: 0.2
pH, UA: 5

## 2014-04-23 LAB — POCT UA - MICROSCOPIC ONLY: Epithelial cells, urine per micros: >20

## 2014-04-23 NOTE — Progress Notes (Signed)
URINE, MICRO, AND CULTURE

## 2014-04-23 NOTE — Progress Notes (Signed)
Patient ID: Kathleen [name], female   DOB: 06/04/1923, 80 y.o.   MRN: [id number] Patient seen at request of her daughter and grand-daughters who are here wit her. Increased agitation and yelling that is more constant than previous. UA is not definitely showing UTI--I will culture. I recommend placement--they had planned on moving her to [place] but now with her increased sx of 'sundowning"----which I do not think are transient--we will need higher level of care. Family is looking.

## 2014-04-25 LAB — URINE CULTURE

## 2014-04-25 NOTE — Telephone Encounter (Signed)
Completed and gave to Mackey Birchwood

## 2014-06-18 ENCOUNTER — Encounter: Payer: Self-pay | Admitting: Clinical

## 2014-06-18 NOTE — Progress Notes (Signed)
CSW has spoken with Admissions Rep from [facility] who confirmed that pt will be admitted into the facility tomorrow. Pt's daughter in communication with facility Rep. and agreeable to plan.  CSW will speak to PCP and request a signed FL2.  Theresia Bough, MSW, LCSW [phone]

## 2014-06-24 ENCOUNTER — Non-Acute Institutional Stay (SKILLED_NURSING_FACILITY): Payer: Medicare Other | Admitting: Adult Health

## 2014-06-24 DIAGNOSIS — I10 Essential (primary) hypertension: Secondary | ICD-10-CM

## 2014-06-24 DIAGNOSIS — M159 Polyosteoarthritis, unspecified: Secondary | ICD-10-CM

## 2014-06-24 DIAGNOSIS — N183 Chronic kidney disease, stage 3 unspecified: Secondary | ICD-10-CM

## 2014-06-24 DIAGNOSIS — D649 Anemia, unspecified: Secondary | ICD-10-CM

## 2014-06-24 DIAGNOSIS — F0390 Unspecified dementia without behavioral disturbance: Secondary | ICD-10-CM

## 2014-06-24 DIAGNOSIS — I509 Heart failure, unspecified: Secondary | ICD-10-CM

## 2014-06-29 ENCOUNTER — Encounter: Payer: Self-pay | Admitting: Adult Health

## 2014-06-29 DIAGNOSIS — F0390 Unspecified dementia without behavioral disturbance: Secondary | ICD-10-CM | POA: Insufficient documentation

## 2014-06-29 DIAGNOSIS — I10 Essential (primary) hypertension: Secondary | ICD-10-CM | POA: Insufficient documentation

## 2014-06-29 NOTE — Progress Notes (Signed)
Patient ID: Kathleen [name], female   DOB: Apr 17, 1923, 80 y.o.   MRN: [id number]  [name]     No Known Allergies     Chief Complaint  Patient presents with  . Acute Visit    follow up transfer     HPI:  She has been transferred to this facility for long term placement. She states that she no longer has a home; and wasn't able to live on her own. There is little information available in her record. There are no concerns being voiced by the nursing staff at this time    Past Medical History  Diagnosis Date  . Hypertension   . Arthritis   . Spinal stenosis   . Osteoarthritis   . Anemia   . CHF (congestive heart failure)   . Heart murmur     aortic regurg  . Osteoporosis   . Chronic kidney disease   . Generalized OA 12/16/2011    Significant. Seems to have encompass all joints. Patient is complaining of significant pain but dementia allows patient to be happy.  . Dementia   . SunDown syndrome   . Atrial fibrillation   . 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 Paroxysmal atrial fibrillation 10/13/2010    Rate controlled Only on aspirin due to hx of bicep hematoma 09/2010) and significant fall risk   . CHF (congestive heart failure)     Past Surgical History  Procedure Laterality Date  . Nephrectomy Right   . Tonsillectomy      VITAL SIGNS BP 111/90 mmHg  Pulse 69  Ht 4\' 10"  (1.473 m)  Wt 122 lb (55.339 kg)  BMI 25.51 kg/m2   Outpatient Encounter Prescriptions as of 06/24/2014  Medication Sig  . amLODipine (NORVASC) 5 MG tablet TAKE 1 TABLET EVERY DAY  . aspirin 81 MG tablet Take 1 tablet (81 mg total) by mouth daily.  Marland Kitchen azithromycin (ZITHROMAX) 250 MG tablet Take 1 tablet (250 mg total) by mouth daily.  . ferrous sulfate (FERROUSUL) 325 (65 FE) MG tablet Take 325 mg by mouth daily.   . furosemide (LASIX) 20 MG tablet Take 20 mg by mouth daily.  Marland Kitchen guaiFENesin (MUCINEX) 600 MG 12 hr tablet Take 1 tablet (600 mg total) by mouth 2 (two) times  daily.  Marland Kitchen loratadine (CLARITIN) 10 MG tablet Take 0.5 tablets (5 mg total) by mouth daily.  Marland Kitchen PATADAY 0.2 % SOLN APPLY 1 DROP TO EYE 2 (TWO) TIMES DAILY.  Marland Kitchen sulfamethoxazole-trimethoprim (BACTRIM DS) 800-160 MG per tablet Take 1 tablet by mouth 2 (two) times daily.     SIGNIFICANT DIAGNOSTIC EXAMS    LABS REVIEWED:   NONE AVAILABLE     Review of Systems  Constitutional: Negative for malaise/fatigue.  Respiratory: Negative for cough and shortness of breath.   Cardiovascular: Positive for leg swelling. Negative for chest pain and palpitations.  Gastrointestinal: Negative for heartburn, abdominal pain and constipation.  Musculoskeletal: Negative for myalgias and joint pain.  Skin: Negative.   Neurological: Negative for headaches.  Psychiatric/Behavioral: The patient is not nervous/anxious.      Physical Exam  Constitutional: No distress.  Thin   Neck: Neck supple. No JVD present. No thyromegaly present.  Cardiovascular: Normal rate, regular rhythm and intact distal pulses.   Respiratory: Effort normal and breath sounds normal. No respiratory distress.  GI: Soft. Bowel sounds are normal. She exhibits no distension. There is no tenderness.  Musculoskeletal: She exhibits edema.  Has arthritic changes in both hands Is  able to move all extremities Has stiff posture  Has bilateral lower extremity edema; trace   Skin: She is not diaphoretic.       ASSESSMENT/ PLAN:  1. Hypertension; is stable will continue norvasc 5 mg daily and asa 81 mg daily and will monitor   2. Anemia of chronic disease; will continue iron daily   3. Stage III CKD: no recent lab results available will continue to monitor her status.   4. Dementia without behavioral disturbance: she has had a decline; as she now requires skilled nursing. She is presently not on medications; will not make changes and will monitor   5. CHF: she is stable has trace pedal present; is presently not taking medications  will monitor  6. Osteoarthritis: she does have deformities present; she states this interferes with her ability to feed herself. Will monitor her status.   Will check cbc and cmp  Time spent with patient 50 minutes.      Synthia Innocenteborah Taniela Feltus NP [facility]  Contact [phone] Monday through Friday 8am- 5pm  After hours call [phone]

## 2014-07-07 ENCOUNTER — Non-Acute Institutional Stay (SKILLED_NURSING_FACILITY): Payer: Medicare Other | Admitting: Internal Medicine

## 2014-07-07 DIAGNOSIS — N183 Chronic kidney disease, stage 3 unspecified: Secondary | ICD-10-CM

## 2014-07-07 DIAGNOSIS — I10 Essential (primary) hypertension: Secondary | ICD-10-CM

## 2014-07-07 DIAGNOSIS — F0390 Unspecified dementia without behavioral disturbance: Secondary | ICD-10-CM

## 2014-07-07 DIAGNOSIS — M159 Polyosteoarthritis, unspecified: Secondary | ICD-10-CM

## 2014-07-11 ENCOUNTER — Encounter: Payer: Self-pay | Admitting: Internal Medicine

## 2014-07-11 NOTE — Progress Notes (Signed)
Patient ID: Kathleen [name], female   DOB: 1922-08-09, 81 y.o.   MRN: [id number]    HISTORY AND PHYSICAL  Location:    [facility]    Extended Emergency Contact Information Primary Emergency Contact: McGregor,Barbara Address: [address]          [place], [place] [address] D[place] of [place] Home Phone: [phone] Work Phone: [phone] Relation: Daughter Secondary Emergency Contact: McGregor,Nathaniel Address: [address]          [place], [place] [address] [place] of [place] Home Phone: [phone] Relation: Relative  Advanced Directive information  None on file  Chief Complaint  Patient presents with  . New Admit To SNF    encephalopathy, stage III CKD, PAF, Anemia, HTN, generalized OA, osteoporosis, spinal stenosis    HPI:  81 yo female seen today as a new admission into SNF with above dx. She is pleasantly confused. She was living at home with family prior to her admission and will be a long term resident.  Past Medical History  Diagnosis Date  . Hypertension   . Arthritis   . Spinal stenosis   . Osteoarthritis   . Anemia   . CHF (congestive heart failure)   . Heart murmur     aortic regurg  . Osteoporosis   . Chronic kidney disease   . Generalized OA 12/16/2011    Significant. Seems to have encompass all joints. Patient is complaining of significant pain but dementia allows patient to be happy.  . Dementia   . SunDown syndrome   . Atrial fibrillation   . 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 Paroxysmal atrial fibrillation 10/13/2010    Rate controlled Only on aspirin due to hx of bicep hematoma 09/2010) and significant fall risk   . CHF (congestive heart failure)     Past Surgical History  Procedure Laterality Date  . Nephrectomy Right   . Tonsillectomy      Patient Care Team: Shauna Hugh, DO as PCP - General (Internal Medicine) Sharee Holster, NP as Nurse Practitioner (Nurse  Practitioner)  History   Social History  . Marital Status: Single    Spouse Name: N/A    Number of Children: 2  . Years of Education: 7   Occupational History  .     Social History Main Topics  . Smoking status: Former Games developer  . Smokeless tobacco: Never Used     Comment: quit smoking in the 70's  . Alcohol Use: No  . Drug Use: No  . Sexual Activity: No   Other Topics Concern  . Not on file   Social History Narrative   Widowed for many years,    Raised in [place], but moved to [place]   Moved to [place] to live with daughter.    Daughter, Abundio Miu, works at [employer]           reports that she has quit smoking. She has never used smokeless tobacco. She reports that she does not drink alcohol or use illicit drugs.  Family History  Problem Relation Age of Onset  . Hypertension Daughter    No family status information on file.    Immunization History  Administered Date(s) Administered  . Influenza-Unspecified 04/10/2013    No Known Allergies  Medications: Patient's Medications  New Prescriptions   No medications on file  Previous Medications   AMLODIPINE (NORVASC) 5 MG TABLET    TAKE 1 TABLET EVERY DAY   ASPIRIN 81  MG TABLET    Take 1 tablet (81 mg total) by mouth daily.   AZITHROMYCIN (ZITHROMAX) 250 MG TABLET    Take 1 tablet (250 mg total) by mouth daily.   FERROUS SULFATE (FERROUSUL) 325 (65 FE) MG TABLET    Take 325 mg by mouth daily.    FUROSEMIDE (LASIX) 20 MG TABLET    Take 20 mg by mouth daily.   GUAIFENESIN (MUCINEX) 600 MG 12 HR TABLET    Take 1 tablet (600 mg total) by mouth 2 (two) times daily.   LORATADINE (CLARITIN) 10 MG TABLET    Take 0.5 tablets (5 mg total) by mouth daily.   PATADAY 0.2 % SOLN    APPLY 1 DROP TO EYE 2 (TWO) TIMES DAILY.   SULFAMETHOXAZOLE-TRIMETHOPRIM (BACTRIM DS) 800-160 MG PER TABLET    Take 1 tablet by mouth 2 (two) times daily.  Modified Medications   No medications on file  Discontinued Medications   No  medications on file    Review of Systems   Unable to obtain due to mental status.  Filed Vitals:   07/03/14 2021  BP: 111/93  Pulse: 69  Temp: 96.9 F (36.1 C)  Weight: 120 lb (54.432 kg)   Body mass index is 25.09 kg/(m^2).  Physical Exam  CONSTITUTIONAL: Looks well in NAD. Awake and alert but confused.  HEENT: PERRLA. Oropharynx clear and without exudate. MMM NECK: Supple. Nontender. No palpable cervical or supraclavicular lymph nodes. No carotid bruit b/l.  CVS: Irregular irregular with 1/6 SEmurmur. No gallop or rub. LUNGS: CTA b/l no wheezing, rales or rhonchi. ABDOMEN: Bowel sounds present x 4. Soft, nontender, nondistended. No palpable mass or bruit EXTREMITIES: No edema b/l. Distal pulses palpable. No calf tenderness PSYCH: confused but not agitated  Labs reviewed: Lab on 04/23/2014  Component Date Value Ref Range Status  . Color, UA 04/23/2014 YELLOW   Final  . Clarity, UA 04/23/2014 CLEAR   Final  . Glucose, UA 04/23/2014 NEG   Final  . Bilirubin, UA 04/23/2014 NEG   Final  . Ketones, UA 04/23/2014 NEG   Final  . Spec Grav, UA 04/23/2014 <=1.005   Final  . Blood, UA 04/23/2014 TRACE-LYSED   Final  . pH, UA 04/23/2014 5.0   Final  . Protein, UA 04/23/2014 30   Final  . Urobilinogen, UA 04/23/2014 0.2   Final  . Nitrite, UA 04/23/2014 NEG   Final  . Leukocytes, UA 04/23/2014 moderate (2+)   Final  . WBC, Ur, HPF, POC 04/23/2014 20-30   Final  . RBC, urine, microscopic 04/23/2014 OCC   Final  . Bacteria, U Microscopic 04/23/2014 2+   Final  . Epithelial cells, urine per micros 04/23/2014 >20   Final  . Colony Count 04/23/2014 20,OOO COLONIES/ML   Final  . Organism ID, Bacteria 04/23/2014 Multiple bacterial morphotypes present, none   Final  . Organism ID, Bacteria 04/23/2014 predominant. Suggest appropriate recollection if    Final  . Organism ID, Bacteria 04/23/2014 clinically indicated.   Final    No results found.   Assessment/Plan    ICD-9-CM  ICD-10-CM   1. Dementia without behavioral disturbance 294.20 F03.90   2. Generalized OA 715.00 M15.9   3. Kidney disease, chronic, stage III (GFR 30-59 ml/min) 585.3 N18.3   4. Essential hypertension, benign 401.1 I10    - she is medically stable on current tx. Continue PT/OT/ST as ordered.   - will need to address code status if not done already  Benjamin Merrihew S. Ancil Linsey  [facility] [address] [place], [place] [address] [phone] Office (Wednesdays and Fridays 8 AM - 5 PM) [phone] Cell (Monday-Friday 8 AM - 5 PM)

## 2014-07-23 ENCOUNTER — Other Ambulatory Visit: Payer: Self-pay | Admitting: Internal Medicine

## 2014-07-24 LAB — URINALYSIS W MICROSCOPIC + REFLEX CULTURE
Bacteria, UA: NONE SEEN
Bilirubin Urine: NEGATIVE
Casts: NONE SEEN
Crystals: NONE SEEN
Glucose, UA: NEGATIVE mg/dL
Hgb urine dipstick: NEGATIVE
Ketones, ur: NEGATIVE mg/dL
Leukocytes, UA: NEGATIVE
Nitrite: NEGATIVE
Protein, ur: 30 mg/dL — AB
Specific Gravity, Urine: 1.013 (ref 1.005–1.030)
Squamous Epithelial / LPF: NONE SEEN
Urobilinogen, UA: 0.2 mg/dL (ref 0.0–1.0)
pH: 7 (ref 5.0–8.0)

## 2014-07-28 ENCOUNTER — Non-Acute Institutional Stay (SKILLED_NURSING_FACILITY): Payer: Medicare Other | Admitting: Adult Health

## 2014-07-28 DIAGNOSIS — M159 Polyosteoarthritis, unspecified: Secondary | ICD-10-CM

## 2014-07-28 DIAGNOSIS — N183 Chronic kidney disease, stage 3 unspecified: Secondary | ICD-10-CM

## 2014-07-28 DIAGNOSIS — I509 Heart failure, unspecified: Secondary | ICD-10-CM

## 2014-07-28 DIAGNOSIS — D649 Anemia, unspecified: Secondary | ICD-10-CM

## 2014-07-28 DIAGNOSIS — F0390 Unspecified dementia without behavioral disturbance: Secondary | ICD-10-CM

## 2014-07-28 DIAGNOSIS — I10 Essential (primary) hypertension: Secondary | ICD-10-CM

## 2014-07-29 ENCOUNTER — Telehealth: Payer: Self-pay | Admitting: *Deleted

## 2014-07-29 NOTE — Telephone Encounter (Signed)
Kathleen [name] called and left voice message that patient is at [facility] and she would like to speak with Dr concerning patient's care. I tried calling her back yesterday and office was closed. Tried calling back today 1/19 and phone just rings and rings.

## 2014-08-07 ENCOUNTER — Non-Acute Institutional Stay (SKILLED_NURSING_FACILITY): Payer: Medicare Other | Admitting: Adult Health

## 2014-08-07 DIAGNOSIS — N183 Chronic kidney disease, stage 3 unspecified: Secondary | ICD-10-CM

## 2014-08-07 DIAGNOSIS — R1314 Dysphagia, pharyngoesophageal phase: Secondary | ICD-10-CM

## 2014-08-07 DIAGNOSIS — I10 Essential (primary) hypertension: Secondary | ICD-10-CM

## 2014-08-07 DIAGNOSIS — F0390 Unspecified dementia without behavioral disturbance: Secondary | ICD-10-CM

## 2014-08-09 ENCOUNTER — Inpatient Hospital Stay (HOSPITAL_BASED_OUTPATIENT_CLINIC_OR_DEPARTMENT_OTHER)
Admission: EM | Admit: 2014-08-09 | Discharge: 2014-08-10 | DRG: 947 | Disposition: A | Discharge status: Hospice | Payer: Medicare Other | Attending: Internal Medicine | Admitting: Internal Medicine

## 2014-08-09 ENCOUNTER — Emergency Department (HOSPITAL_BASED_OUTPATIENT_CLINIC_OR_DEPARTMENT_OTHER): Payer: Medicare Other

## 2014-08-09 ENCOUNTER — Encounter (HOSPITAL_BASED_OUTPATIENT_CLINIC_OR_DEPARTMENT_OTHER): Payer: Self-pay | Admitting: *Deleted

## 2014-08-09 DIAGNOSIS — M810 Age-related osteoporosis without current pathological fracture: Secondary | ICD-10-CM | POA: Diagnosis present

## 2014-08-09 DIAGNOSIS — D649 Anemia, unspecified: Secondary | ICD-10-CM | POA: Diagnosis present

## 2014-08-09 DIAGNOSIS — Z87891 Personal history of nicotine dependence: Secondary | ICD-10-CM | POA: Diagnosis not present

## 2014-08-09 DIAGNOSIS — E872 Acidosis: Secondary | ICD-10-CM | POA: Diagnosis present

## 2014-08-09 DIAGNOSIS — M4800 Spinal stenosis, site unspecified: Secondary | ICD-10-CM | POA: Diagnosis present

## 2014-08-09 DIAGNOSIS — F0391 Unspecified dementia with behavioral disturbance: Secondary | ICD-10-CM | POA: Diagnosis present

## 2014-08-09 DIAGNOSIS — Z7982 Long term (current) use of aspirin: Secondary | ICD-10-CM | POA: Diagnosis not present

## 2014-08-09 DIAGNOSIS — J9690 Respiratory failure, unspecified, unspecified whether with hypoxia or hypercapnia: Secondary | ICD-10-CM | POA: Diagnosis present

## 2014-08-09 DIAGNOSIS — I441 Atrioventricular block, second degree: Secondary | ICD-10-CM | POA: Diagnosis present

## 2014-08-09 DIAGNOSIS — M1990 Unspecified osteoarthritis, unspecified site: Secondary | ICD-10-CM | POA: Diagnosis present

## 2014-08-09 DIAGNOSIS — R627 Adult failure to thrive: Secondary | ICD-10-CM | POA: Diagnosis present

## 2014-08-09 DIAGNOSIS — E43 Unspecified severe protein-calorie malnutrition: Secondary | ICD-10-CM | POA: Diagnosis present

## 2014-08-09 DIAGNOSIS — D72819 Decreased white blood cell count, unspecified: Secondary | ICD-10-CM | POA: Diagnosis present

## 2014-08-09 DIAGNOSIS — R451 Restlessness and agitation: Secondary | ICD-10-CM | POA: Diagnosis present

## 2014-08-09 DIAGNOSIS — T68XXXA Hypothermia, initial encounter: Secondary | ICD-10-CM

## 2014-08-09 DIAGNOSIS — R05 Cough: Secondary | ICD-10-CM | POA: Diagnosis not present

## 2014-08-09 DIAGNOSIS — I129 Hypertensive chronic kidney disease with stage 1 through stage 4 chronic kidney disease, or unspecified chronic kidney disease: Secondary | ICD-10-CM | POA: Diagnosis present

## 2014-08-09 DIAGNOSIS — R680 Hypothermia, not associated with low environmental temperature: Secondary | ICD-10-CM | POA: Diagnosis present

## 2014-08-09 DIAGNOSIS — Z905 Acquired absence of kidney: Secondary | ICD-10-CM | POA: Diagnosis present

## 2014-08-09 DIAGNOSIS — R011 Cardiac murmur, unspecified: Secondary | ICD-10-CM | POA: Diagnosis present

## 2014-08-09 DIAGNOSIS — N183 Chronic kidney disease, stage 3 unspecified: Secondary | ICD-10-CM | POA: Diagnosis present

## 2014-08-09 DIAGNOSIS — E440 Moderate protein-calorie malnutrition: Secondary | ICD-10-CM | POA: Diagnosis not present

## 2014-08-09 DIAGNOSIS — F0390 Unspecified dementia without behavioral disturbance: Secondary | ICD-10-CM

## 2014-08-09 DIAGNOSIS — J690 Pneumonitis due to inhalation of food and vomit: Secondary | ICD-10-CM | POA: Diagnosis not present

## 2014-08-09 DIAGNOSIS — R1314 Dysphagia, pharyngoesophageal phase: Secondary | ICD-10-CM | POA: Diagnosis present

## 2014-08-09 DIAGNOSIS — R4182 Altered mental status, unspecified: Secondary | ICD-10-CM

## 2014-08-09 DIAGNOSIS — Z79899 Other long term (current) drug therapy: Secondary | ICD-10-CM

## 2014-08-09 DIAGNOSIS — F05 Delirium due to known physiological condition: Secondary | ICD-10-CM | POA: Diagnosis present

## 2014-08-09 DIAGNOSIS — F03C0 Unspecified dementia, severe, without behavioral disturbance, psychotic disturbance, mood disturbance, and anxiety: Secondary | ICD-10-CM

## 2014-08-09 DIAGNOSIS — I509 Heart failure, unspecified: Secondary | ICD-10-CM | POA: Diagnosis present

## 2014-08-09 DIAGNOSIS — J9602 Acute respiratory failure with hypercapnia: Secondary | ICD-10-CM | POA: Insufficient documentation

## 2014-08-09 DIAGNOSIS — R001 Bradycardia, unspecified: Secondary | ICD-10-CM | POA: Diagnosis present

## 2014-08-09 DIAGNOSIS — I480 Paroxysmal atrial fibrillation: Secondary | ICD-10-CM | POA: Diagnosis present

## 2014-08-09 DIAGNOSIS — R4781 Slurred speech: Secondary | ICD-10-CM | POA: Diagnosis not present

## 2014-08-09 DIAGNOSIS — J189 Pneumonia, unspecified organism: Secondary | ICD-10-CM | POA: Diagnosis not present

## 2014-08-09 DIAGNOSIS — Z6826 Body mass index (BMI) 26.0-26.9, adult: Secondary | ICD-10-CM | POA: Diagnosis not present

## 2014-08-09 DIAGNOSIS — J9601 Acute respiratory failure with hypoxia: Secondary | ICD-10-CM | POA: Diagnosis not present

## 2014-08-09 LAB — URINALYSIS, ROUTINE W REFLEX MICROSCOPIC
Bilirubin Urine: NEGATIVE
Glucose, UA: NEGATIVE mg/dL
HGB URINE DIPSTICK: NEGATIVE
Ketones, ur: NEGATIVE mg/dL
LEUKOCYTES UA: NEGATIVE
Nitrite: NEGATIVE
PH: 5 (ref 5.0–8.0)
Protein, ur: 30 mg/dL — AB
SPECIFIC GRAVITY, URINE: 1.017 (ref 1.005–1.030)
Urobilinogen, UA: 0.2 mg/dL (ref 0.0–1.0)

## 2014-08-09 LAB — CBC WITH DIFFERENTIAL/PLATELET
Basophils Absolute: 0 10*3/uL (ref 0.0–0.1)
Basophils Relative: 0 % (ref 0–1)
EOS PCT: 2 % (ref 0–5)
Eosinophils Absolute: 0.1 10*3/uL (ref 0.0–0.7)
HCT: 27 % — ABNORMAL LOW (ref 36.0–46.0)
HEMOGLOBIN: 9.2 g/dL — AB (ref 12.0–15.0)
LYMPHS ABS: 1.4 10*3/uL (ref 0.7–4.0)
Lymphocytes Relative: 38 % (ref 12–46)
MCH: 30 pg (ref 26.0–34.0)
MCHC: 34.1 g/dL (ref 30.0–36.0)
MCV: 87.9 fL (ref 78.0–100.0)
MONOS PCT: 8 % (ref 3–12)
Monocytes Absolute: 0.3 10*3/uL (ref 0.1–1.0)
NEUTROS PCT: 52 % (ref 43–77)
Neutro Abs: 1.9 10*3/uL (ref 1.7–7.7)
PLATELETS: 225 10*3/uL (ref 150–400)
RBC: 3.07 MIL/uL — ABNORMAL LOW (ref 3.87–5.11)
RDW: 14.9 % (ref 11.5–15.5)
WBC: 3.6 10*3/uL — AB (ref 4.0–10.5)

## 2014-08-09 LAB — BASIC METABOLIC PANEL
ANION GAP: 3 — AB (ref 5–15)
BUN: 49 mg/dL — ABNORMAL HIGH (ref 6–23)
CO2: 25 mmol/L (ref 19–32)
Calcium: 8.3 mg/dL — ABNORMAL LOW (ref 8.4–10.5)
Chloride: 110 mmol/L (ref 96–112)
Creatinine, Ser: 1.61 mg/dL — ABNORMAL HIGH (ref 0.50–1.10)
GFR calc Af Amer: 32 mL/min — ABNORMAL LOW (ref >90)
GFR calc non Af Amer: 27 mL/min — ABNORMAL LOW (ref >90)
Glucose, Bld: 140 mg/dL — ABNORMAL HIGH (ref 70–99)
Potassium: 3.8 mmol/L (ref 3.5–5.1)
Sodium: 138 mmol/L (ref 135–145)

## 2014-08-09 LAB — I-STAT ARTERIAL BLOOD GAS, ED
Acid-base deficit: 1 mmol/L (ref 0.0–2.0)
Bicarbonate: 25.9 mEq/L — ABNORMAL HIGH (ref 20.0–24.0)
O2 SAT: 82 %
PO2 ART: 40 mmHg — AB (ref 80.0–100.0)
Patient temperature: 91.51
TCO2: 28 mmol/L (ref 0–100)
pCO2 arterial: 47.3 mmHg — ABNORMAL HIGH (ref 35.0–45.0)
pH, Arterial: 7.326 — ABNORMAL LOW (ref 7.350–7.450)

## 2014-08-09 LAB — URINE MICROSCOPIC-ADD ON

## 2014-08-09 LAB — TROPONIN I: Troponin I: 0.03 ng/mL (ref <0.031)

## 2014-08-09 LAB — BRAIN NATRIURETIC PEPTIDE: B Natriuretic Peptide: 110.3 pg/mL — ABNORMAL HIGH (ref 0.0–100.0)

## 2014-08-09 MED ORDER — VANCOMYCIN HCL IN DEXTROSE 1-5 GM/200ML-% IV SOLN
1000.0000 mg | Freq: Once | INTRAVENOUS | Status: AC
  Administered 2014-08-09: 1000 mg via INTRAVENOUS
  Filled 2014-08-09: qty 200

## 2014-08-09 MED ORDER — PIPERACILLIN-TAZOBACTAM 3.375 G IVPB
3.3750 g | Freq: Three times a day (TID) | INTRAVENOUS | Status: DC
Start: 2014-08-10 — End: 2014-08-10
  Administered 2014-08-10: 3.375 g via INTRAVENOUS
  Filled 2014-08-09 (×3): qty 50

## 2014-08-09 MED ORDER — PIPERACILLIN-TAZOBACTAM 3.375 G IVPB 30 MIN
3.3750 g | INTRAVENOUS | Status: AC
  Administered 2014-08-09: 3.375 g via INTRAVENOUS
  Filled 2014-08-09 (×2): qty 50

## 2014-08-09 MED ORDER — VANCOMYCIN HCL IN DEXTROSE 750-5 MG/150ML-% IV SOLN
750.0000 mg | INTRAVENOUS | Status: DC
  Filled 2014-08-09: qty 150

## 2014-08-09 MED ORDER — VANCOMYCIN HCL 500 MG IV SOLR
500.0000 mg | INTRAVENOUS | Status: DC
  Filled 2014-08-09: qty 500

## 2014-08-09 NOTE — ED Notes (Signed)
Family reports pt d/c from long term care facility today per their request- daughter reports she noticed pt wheezing while in bathroom- family concerned that pt is having difficulty with her breathing- RT in triage to assess

## 2014-08-09 NOTE — ED Provider Notes (Signed)
CSN: [id number]     Arrival date & time 08/09/14  1800 History  This chart was scribed for Kathleen [name], * by Evon Slack, ED Scribe. This patient was seen in room MH09/MH09 and the patient's care was started at [facility].      Chief Complaint  Patient presents with  . Breathing Problem   Patient is a 81 y.o. female presenting with difficulty breathing. The history is provided by a relative. No language interpreter was used.  Breathing Problem   Level 5 Caveat: Dementia  HPI Comments: Kathleen [name] is a 81 y.o. female with PMHx of dementia  who presents to the Emergency Department complaining of new audible wheezing onset tonight. Pt daughter states that she has been using her stomach muscle to breathe. Daughter states she has been choking when eating or drinking. Daughter states that she was was just released from the nursing home today. Daughter states she has Hx of dementia and has not been sleeping at night. States she has been taking melatonin to try to regulate her sleeping pattern. Daughter states that she has been doing a lot of sleeping during the day.   Pt states she does have a cough.  Pt denies trouble breathing.   Past Medical History  Diagnosis Date  . Hypertension   . Arthritis   . Spinal stenosis   . Osteoarthritis   . Anemia   . CHF (congestive heart failure)   . Heart murmur     aortic regurg  . Osteoporosis   . Chronic kidney disease   . Generalized OA 12/16/2011    Significant. Seems to have encompass all joints. Patient is complaining of significant pain but dementia allows patient to be happy.  . Dementia   . SunDown syndrome   . Atrial fibrillation   . 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 Paroxysmal atrial fibrillation 10/13/2010    Rate controlled Only on aspirin due to hx of bicep hematoma 09/2010) and significant fall risk   . CHF (congestive heart failure)    Past Surgical History  Procedure Laterality Date  . Nephrectomy  Right   . Tonsillectomy     Family History  Problem Relation Age of Onset  . Hypertension Daughter    History  Substance Use Topics  . Smoking status: Former Games developer  . Smokeless tobacco: Never Used     Comment: quit smoking in the 70's  . Alcohol Use: No   OB History    No data available      Review of Systems  Unable to perform ROS: Dementia  Constitutional: Negative for fever.  Respiratory: Positive for cough and wheezing.   Psychiatric/Behavioral: Positive for confusion and sleep disturbance.     Allergies  Review of patient's allergies indicates no known allergies.  Home Medications   Prior to Admission medications   Medication Sig Start Date End Date Taking? Authorizing Provider  amLODipine (NORVASC) 5 MG tablet TAKE 1 TABLET EVERY DAY   Yes Nestor Ramp, MD  aspirin 81 MG tablet Take 1 tablet (81 mg total) by mouth daily. 06/01/13  Yes Lonia Skinner, MD  ferrous sulfate (FERROUSUL) 325 (65 FE) MG tablet Take 325 mg by mouth daily.  04/02/12  Yes Zachery Dauer, MD  Melatonin 3 MG TABS Take by mouth.   Yes Historical Provider, MD  memantine (NAMENDA XR) 7 MG CP24 24 hr capsule Take 7 mg by mouth daily.   Yes Historical Provider, MD  Milagros Reap  0.2 % SOLN APPLY 1 DROP TO EYE 2 (TWO) TIMES DAILY. 02/28/14  Yes Nestor Ramp, MD   BP 173/82 mmHg  Pulse 53  Temp(Src) 91.3 F (32.9 C) (Rectal)  Resp 22  Wt 123 lb (55.792 kg)  SpO2 100%   Physical Exam  Constitutional: She appears well-developed and well-nourished. No distress.  HENT:  Head: Normocephalic and atraumatic.  Mouth/Throat: Oropharynx is clear and moist.  Eyes: Conjunctivae are normal. Pupils are equal, round, and reactive to light. No scleral icterus.  Neck: Neck supple.  Cardiovascular: Regular rhythm and intact distal pulses.  Bradycardia present.   Murmur heard.  Systolic murmur is present with a grade of 2/6  Pulmonary/Chest: Effort normal. No stridor. No respiratory distress. She has rales  (bibasilar, worse on right).  Abdominal: Soft. Bowel sounds are normal. She exhibits no distension. There is no tenderness.  Musculoskeletal: Normal range of motion. She exhibits edema (1+ BLE).  Neurological: She is alert. She is disoriented (oriented to person and place, not time.  ).  Skin: Skin is warm and dry. No rash noted.  Psychiatric: She has a normal mood and affect. Her behavior is normal.  Nursing note and vitals reviewed.   ED Course  CRITICAL CARE Performed by: Orson Gear DAVID Authorized by: Orson Gear DAVID Total critical care time: 45 minutes Critical care time was exclusive of separately billable procedures and treating other patients. Critical care was necessary to treat or prevent imminent or life-threatening deterioration of the following conditions: CNS failure or compromise and respiratory failure. Critical care was time spent personally by me on the following activities: development of treatment plan with patient or surrogate, discussions with consultants, evaluation of patient's response to treatment, examination of patient, obtaining history from patient or surrogate, ordering and performing treatments and interventions, ordering and review of laboratory studies, ordering and review of radiographic studies, pulse oximetry, re-evaluation of patient's condition and review of old charts.   (including critical care time) DIAGNOSTIC STUDIES: Oxygen Saturation is 100% on RA, normal by my interpretation.    COORDINATION OF CARE: 7:05 PM-Discussed treatment plan with family at bedside and family agreed to plan.     Labs Review Labs Reviewed  CBC WITH DIFFERENTIAL/PLATELET - Abnormal; Notable for the following:    WBC 3.6 (*)    RBC 3.07 (*)    Hemoglobin 9.2 (*)    HCT 27.0 (*)    All other components within normal limits  BASIC METABOLIC PANEL - Abnormal; Notable for the following:    Glucose, Bld 140 (*)    BUN 49 (*)    Creatinine, Ser 1.61 (*)     Calcium 8.3 (*)    GFR calc non Af Amer 27 (*)    GFR calc Af Amer 32 (*)    Anion gap 3 (*)    All other components within normal limits  BRAIN NATRIURETIC PEPTIDE - Abnormal; Notable for the following:    B Natriuretic Peptide 110.3 (*)    All other components within normal limits  URINALYSIS, ROUTINE W REFLEX MICROSCOPIC - Abnormal; Notable for the following:    APPearance CLOUDY (*)    Protein, ur 30 (*)    All other components within normal limits  URINE MICROSCOPIC-ADD ON - Abnormal; Notable for the following:    Bacteria, UA MANY (*)    All other components within normal limits  I-STAT ARTERIAL BLOOD GAS, ED - Abnormal; Notable for the following:    pH, Arterial 7.326 (*)  pCO2 arterial 47.3 (*)    pO2, Arterial 40.0 (*)    Bicarbonate 25.9 (*)    All other components within normal limits  CULTURE, BLOOD (ROUTINE X 2)  CULTURE, BLOOD (ROUTINE X 2)  TROPONIN I  BLOOD GAS, ARTERIAL    Imaging Review Dg Chest 2 View  08/09/2014   CLINICAL DATA:  Wheezing and fever.  EXAM: CHEST  2 VIEW  COMPARISON:  PA and lateral chest 11/23/2013 in 08/17/2013.  FINDINGS: Lung volumes are somewhat low. There is cardiomegaly and mild interstitial edema. No consolidative process, pneumothorax or pleural effusion is identified.  IMPRESSION: Cardiomegaly and mild interstitial edema.   Electronically Signed   By: Drusilla Kanner M.D.   On: 08/09/2014 18:57  All radiology studies independently viewed by me.         EKG Interpretation   Date/Time:  Saturday August 09 2014 19:40:45 EST Ventricular Rate:  48 PR Interval:  314 QRS Duration: 102 QT Interval:  514 QTC Calculation: 459 R Axis:   6 Text Interpretation:  Sinus bradycardia with sinus arrhythmia with 1st  degree A-V block Moderate voltage criteria for LVH, may be normal variant  Nonspecific T wave abnormality Abnormal ECG compared to prior, rate  slower, PR interval longer.  Confirmed by [facility]  MD, Thurston Pounds ([id number]) on   08/09/2014 7:43:40 PM      MDM   Final diagnoses:  Hypothermia, initial encounter  Bradycardia  Acute respiratory failure with hypercapnia  Altered mental status, unspecified altered mental status type      81 yo female with hx of dementia who presents from home due to trouble breathing.  Family noticed wheezing and "belly breathing".  She was discharged from her nursing facility today at family's request.  Family also notes she has been choking when eating or drinking.    In ED, found to be hypothermic.  Also has rales on lung exam, worse on right.  With moderate increased WOB.  At risk for pneumonia, so will treat for HCAP.  CXR more convincing for CHF exacerbation.    11:46 PM during ED course, pt became somewhat more drowsy.  ABG showed hypercapnia, although difficult to accurately interpret due to hypothermia.  Regardless, she was placed on BPAP which dramatically improved her WOB and seemed to help her mental status as well.  Discussed case with Dr. Katrinka Blazing, critical care, who has accepted to [facility] ICU.     I personally performed the services described in this documentation, which was scribed in my presence. The recorded information has been reviewed and is accurate.      Kathleen Churn III, MD 08/09/14 [phone]

## 2014-08-09 NOTE — ED Notes (Signed)
Bair hugger applied to pt. Danna Hefty, RN

## 2014-08-09 NOTE — ED Notes (Signed)
Dr. Loretha Stapler called to bedside -- pt is bradycardic (40s-50s) and has a decreased level of consciousness (responsive to physical stimulation). Plan of care -- obtain an ABG. Danna Hefty, RN

## 2014-08-09 NOTE — ED Notes (Signed)
Pt's daughter reports pt has had non-productive cough x 3 days, worse after drinking and eating- resp even and unlabored in triage

## 2014-08-09 NOTE — Progress Notes (Addendum)
ANTIBIOTIC CONSULT NOTE - INITIAL  Pharmacy Consult for Vancomycin and Zosyn Indication: rule out sepsis  No Known Allergies  Patient Measurements: Weight: 123 lb (55.792 kg) Adjusted Body Weight:   Vital Signs: Temp: 91.3 F (32.9 C) (01/30 1907) Temp Source: Rectal (01/30 1907) BP: 122/63 mmHg (01/30 1930) Pulse Rate: 56 (01/30 1930) Intake/Output from previous day:   Intake/Output from this shift:    Labs: No results for input(s): WBC, HGB, PLT, LABCREA, CREATININE in the last 72 hours. CrCl cannot be calculated (Patient has no serum creatinine result on file.). No results for input(s): VANCOTROUGH, VANCOPEAK, VANCORANDOM, GENTTROUGH, GENTPEAK, GENTRANDOM, TOBRATROUGH, TOBRAPEAK, TOBRARND, AMIKACINPEAK, AMIKACINTROU, AMIKACIN in the last 72 hours.   Microbiology: No results found for this or any previous visit (from the past 720 hour(s)).  Medical History: Past Medical History  Diagnosis Date  . Hypertension   . Arthritis   . Spinal stenosis   . Osteoarthritis   . Anemia   . CHF (congestive heart failure)   . Heart murmur     aortic regurg  . Osteoporosis   . Chronic kidney disease   . Generalized OA 12/16/2011    Significant. Seems to have encompass all joints. Patient is complaining of significant pain but dementia allows patient to be happy.  . Dementia   . SunDown syndrome   . Atrial fibrillation   . 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 Paroxysmal atrial fibrillation 10/13/2010    Rate controlled Only on aspirin due to hx of bicep hematoma 09/2010) and significant fall risk   . CHF (congestive heart failure)     Medications:  Scheduled:  Assessment: 81yo female with cough x 3 days, to start Vancomycin and Zosyn.  There are no labs resulted at this time.  Goal of Therapy:  Vancomycin trough level 15-20 mcg/ml  Plan:  Vancomycin 1000mg  IV x 1, then 750mg  IV q24 Zosyn 3.375g IV x 1 over , then q8 over 4h F/U baseline labs and adjust  dosing if needed Steady state trough as appropriate  Marisue Humble, PharmD Clinical Pharmacist [facility]   08/09/14  Pharmacy- follow up 2055  Cr 1.61 with estimated CrCl ~33ml/min.  Will change Vancomycin to 500mg  IV q24.  Marisue Humble, PharmD Clinical Pharmacist [facility]

## 2014-08-10 ENCOUNTER — Encounter (HOSPITAL_COMMUNITY): Payer: Self-pay | Admitting: Internal Medicine

## 2014-08-10 DIAGNOSIS — R1314 Dysphagia, pharyngoesophageal phase: Secondary | ICD-10-CM

## 2014-08-10 DIAGNOSIS — J9602 Acute respiratory failure with hypercapnia: Secondary | ICD-10-CM | POA: Insufficient documentation

## 2014-08-10 DIAGNOSIS — E43 Unspecified severe protein-calorie malnutrition: Secondary | ICD-10-CM

## 2014-08-10 DIAGNOSIS — F0391 Unspecified dementia with behavioral disturbance: Secondary | ICD-10-CM

## 2014-08-10 DIAGNOSIS — R0602 Shortness of breath: Secondary | ICD-10-CM

## 2014-08-10 DIAGNOSIS — Z515 Encounter for palliative care: Secondary | ICD-10-CM

## 2014-08-10 DIAGNOSIS — F0390 Unspecified dementia without behavioral disturbance: Secondary | ICD-10-CM

## 2014-08-10 DIAGNOSIS — T68XXXA Hypothermia, initial encounter: Secondary | ICD-10-CM | POA: Diagnosis present

## 2014-08-10 DIAGNOSIS — E872 Acidosis: Secondary | ICD-10-CM

## 2014-08-10 DIAGNOSIS — R001 Bradycardia, unspecified: Secondary | ICD-10-CM

## 2014-08-10 DIAGNOSIS — J9600 Acute respiratory failure, unspecified whether with hypoxia or hypercapnia: Secondary | ICD-10-CM

## 2014-08-10 DIAGNOSIS — R680 Hypothermia, not associated with low environmental temperature: Secondary | ICD-10-CM

## 2014-08-10 DIAGNOSIS — F03C0 Unspecified dementia, severe, without behavioral disturbance, psychotic disturbance, mood disturbance, and anxiety: Secondary | ICD-10-CM

## 2014-08-10 LAB — COMPREHENSIVE METABOLIC PANEL
ALK PHOS: 96 U/L (ref 39–117)
ALT: 15 U/L (ref 0–35)
ANION GAP: 6 (ref 5–15)
AST: 25 U/L (ref 0–37)
Albumin: 2.9 g/dL — ABNORMAL LOW (ref 3.5–5.2)
BILIRUBIN TOTAL: 0.4 mg/dL (ref 0.3–1.2)
BUN: 46 mg/dL — ABNORMAL HIGH (ref 6–23)
CO2: 27 mmol/L (ref 19–32)
Calcium: 8.6 mg/dL (ref 8.4–10.5)
Chloride: 107 mmol/L (ref 96–112)
Creatinine, Ser: 1.61 mg/dL — ABNORMAL HIGH (ref 0.50–1.10)
GFR calc Af Amer: 32 mL/min — ABNORMAL LOW (ref >90)
GFR, EST NON AFRICAN AMERICAN: 27 mL/min — AB (ref >90)
Glucose, Bld: 100 mg/dL — ABNORMAL HIGH (ref 70–99)
POTASSIUM: 4.4 mmol/L (ref 3.5–5.1)
Sodium: 140 mmol/L (ref 135–145)
Total Protein: 7 g/dL (ref 6.0–8.3)

## 2014-08-10 LAB — BLOOD GAS, ARTERIAL
Acid-Base Excess: 0.9 mmol/L (ref 0.0–2.0)
BICARBONATE: 25.5 meq/L — AB (ref 20.0–24.0)
Delivery systems: POSITIVE
Drawn by: 31101
Expiratory PAP: 6
FIO2: 0.3 %
Inspiratory PAP: 12
Mode: POSITIVE
O2 Saturation: 98.9 %
Patient temperature: 98.6
RATE: 10 resp/min
TCO2: 26.8 mmol/L (ref 0–100)
pCO2 arterial: 44.4 mmHg (ref 35.0–45.0)
pH, Arterial: 7.377 (ref 7.350–7.450)
pO2, Arterial: 142 mmHg — ABNORMAL HIGH (ref 80.0–100.0)

## 2014-08-10 LAB — URINALYSIS, ROUTINE W REFLEX MICROSCOPIC
Bilirubin Urine: NEGATIVE
Glucose, UA: NEGATIVE mg/dL
Ketones, ur: NEGATIVE mg/dL
Nitrite: NEGATIVE
PROTEIN: NEGATIVE mg/dL
Specific Gravity, Urine: 1.012 (ref 1.005–1.030)
UROBILINOGEN UA: 0.2 mg/dL (ref 0.0–1.0)
pH: 5 (ref 5.0–8.0)

## 2014-08-10 LAB — CORTISOL: CORTISOL PLASMA: 7.1 ug/dL

## 2014-08-10 LAB — CBC WITH DIFFERENTIAL/PLATELET
BASOS PCT: 0 % (ref 0–1)
Basophils Absolute: 0 10*3/uL (ref 0.0–0.1)
Eosinophils Absolute: 0.1 10*3/uL (ref 0.0–0.7)
Eosinophils Relative: 2 % (ref 0–5)
HEMATOCRIT: 28.3 % — AB (ref 36.0–46.0)
Hemoglobin: 10 g/dL — ABNORMAL LOW (ref 12.0–15.0)
Lymphocytes Relative: 46 % (ref 12–46)
Lymphs Abs: 1.5 10*3/uL (ref 0.7–4.0)
MCH: 30.1 pg (ref 26.0–34.0)
MCHC: 35.3 g/dL (ref 30.0–36.0)
MCV: 85.2 fL (ref 78.0–100.0)
MONOS PCT: 8 % (ref 3–12)
Monocytes Absolute: 0.3 10*3/uL (ref 0.1–1.0)
Neutro Abs: 1.4 10*3/uL — ABNORMAL LOW (ref 1.7–7.7)
Neutrophils Relative %: 44 % (ref 43–77)
PLATELETS: 197 10*3/uL (ref 150–400)
RBC: 3.32 MIL/uL — ABNORMAL LOW (ref 3.87–5.11)
RDW: 15.8 % — ABNORMAL HIGH (ref 11.5–15.5)
WBC: 3.2 10*3/uL — AB (ref 4.0–10.5)

## 2014-08-10 LAB — LACTIC ACID, PLASMA: Lactic Acid, Venous: 0.9 mmol/L (ref 0.5–2.0)

## 2014-08-10 LAB — RAPID URINE DRUG SCREEN, HOSP PERFORMED
Amphetamines: NOT DETECTED
BARBITURATES: NOT DETECTED
Benzodiazepines: NOT DETECTED
COCAINE: NOT DETECTED
Opiates: NOT DETECTED
TETRAHYDROCANNABINOL: NOT DETECTED

## 2014-08-10 LAB — MAGNESIUM: Magnesium: 2 mg/dL (ref 1.5–2.5)

## 2014-08-10 LAB — PROTIME-INR
INR: 0.93 (ref 0.00–1.49)
Prothrombin Time: 12.5 seconds (ref 11.6–15.2)

## 2014-08-10 LAB — TSH: TSH: 1.158 u[IU]/mL (ref 0.350–4.500)

## 2014-08-10 LAB — GLUCOSE, CAPILLARY: GLUCOSE-CAPILLARY: 111 mg/dL — AB (ref 70–99)

## 2014-08-10 LAB — T4, FREE: Free T4: 1 ng/dL (ref 0.80–1.80)

## 2014-08-10 LAB — URINE MICROSCOPIC-ADD ON

## 2014-08-10 LAB — MRSA PCR SCREENING: MRSA by PCR: NEGATIVE

## 2014-08-10 LAB — APTT: APTT: 35 s (ref 24–37)

## 2014-08-10 LAB — PHOSPHORUS: Phosphorus: 4.3 mg/dL (ref 2.3–4.6)

## 2014-08-10 MED ORDER — IPRATROPIUM-ALBUTEROL 0.5-2.5 (3) MG/3ML IN SOLN
3.0000 mL | RESPIRATORY_TRACT | Status: DC
  Administered 2014-08-10 (×2): 3 mL via RESPIRATORY_TRACT
  Filled 2014-08-10 (×2): qty 3

## 2014-08-10 MED ORDER — IPRATROPIUM-ALBUTEROL 0.5-2.5 (3) MG/3ML IN SOLN: 3.0000 mL | RESPIRATORY_TRACT | Status: DC

## 2014-08-10 MED ORDER — IPRATROPIUM-ALBUTEROL 0.5-2.5 (3) MG/3ML IN SOLN
3.0000 mL | Freq: Four times a day (QID) | RESPIRATORY_TRACT | Status: DC
  Filled 2014-08-10: qty 3

## 2014-08-10 MED ORDER — MAGIC MOUTHWASH: 10.0000 mL | Freq: Four times a day (QID) | ORAL | Status: AC

## 2014-08-10 MED ORDER — BISACODYL 10 MG RE SUPP
10.0000 mg | Freq: Every day | RECTAL | Status: DC | PRN
Start: 2014-08-10 — End: 2014-08-10

## 2014-08-10 MED ORDER — IPRATROPIUM-ALBUTEROL 0.5-2.5 (3) MG/3ML IN SOLN: 3.0000 mL | Freq: Three times a day (TID) | RESPIRATORY_TRACT | Status: DC

## 2014-08-10 MED ORDER — HALOPERIDOL LACTATE 2 MG/ML PO CONC: 1.0000 mg | Freq: Four times a day (QID) | ORAL | Status: AC | PRN

## 2014-08-10 MED ORDER — MAGIC MOUTHWASH
10.0000 mL | Freq: Four times a day (QID) | ORAL | Status: DC
  Filled 2014-08-10 (×2): qty 10

## 2014-08-10 MED ORDER — HALOPERIDOL LACTATE 5 MG/ML IJ SOLN: 0.5000 mg | Freq: Four times a day (QID) | INTRAMUSCULAR | Status: DC | PRN

## 2014-08-10 MED ORDER — ACETAMINOPHEN 325 MG PO TABS: 650.0000 mg | ORAL_TABLET | Freq: Three times a day (TID) | ORAL | Status: DC

## 2014-08-10 MED ORDER — HALOPERIDOL LACTATE 2 MG/ML PO CONC
1.0000 mg | Freq: Four times a day (QID) | ORAL | Status: DC | PRN
  Administered 2014-08-10: 1 mg via ORAL
  Filled 2014-08-10 (×2): qty 0.5

## 2014-08-10 MED ORDER — SODIUM CHLORIDE 0.9 % IV SOLN: 250.0000 mL | INTRAVENOUS | Status: DC | PRN

## 2014-08-10 MED ORDER — HEPARIN SODIUM (PORCINE) 5000 UNIT/ML IJ SOLN
5000.0000 [IU] | Freq: Two times a day (BID) | INTRAMUSCULAR | Status: DC
  Administered 2014-08-10: 5000 [IU] via SUBCUTANEOUS
  Filled 2014-08-10 (×4): qty 1

## 2014-08-10 MED ORDER — VANCOMYCIN HCL IN DEXTROSE 750-5 MG/150ML-% IV SOLN: 750.0000 mg | INTRAVENOUS | Status: DC

## 2014-08-10 MED ORDER — ARTIFICIAL TEARS OP OINT: TOPICAL_OINTMENT | OPHTHALMIC | Status: DC | PRN

## 2014-08-10 MED ORDER — BISACODYL 10 MG RE SUPP: 10.0000 mg | Freq: Every day | RECTAL | Status: DC | PRN

## 2014-08-10 MED ORDER — ARTIFICIAL TEARS OP OINT: TOPICAL_OINTMENT | OPHTHALMIC | Status: AC | PRN

## 2014-08-10 MED ORDER — ENOXAPARIN SODIUM 30 MG/0.3ML ~~LOC~~ SOLN
30.0000 mg | SUBCUTANEOUS | Status: DC
  Filled 2014-08-10: qty 0.3

## 2014-08-10 NOTE — Progress Notes (Signed)
PULMONARY / CRITICAL CARE MEDICINE   Name: Kathleen [name] MRN: [id number] DOB: Jul 06, 1923    ADMISSION DATE:  08/09/2014 CONSULTATION DATE:    REFERRING MD :  [facility]  CHIEF COMPLAINT:  Dyspnea and altered mental status  INITIAL PRESENTATION: 71 with dementia with history of hypothermia, has cough + dyspnea + mild altered mental status, found to have temp 90.3, Brady in the 40-50, mild resp acidosis. On BiPAP and rewarming  STUDIES:    SIGNIFICANT EVENTS: 1/30 BiPAP started  HISTORY OF PRESENT ILLNESS:  49 F who was in the NH for dementia for about a month, yesterday she was picked up by her daughter to be taken back to her house because they weren't happy with the care given there and missed meals. They noticed that she has been having cough with eating, dyspnea, wheezing, and mild alteration of mentation. Brought to ED where she was found to have Temp 90.3, sinus bradycardia 40-50 bpm, and mild resp acidosis, CXR shows some interstitial markings possibly fluid overload. Interestingly the faily said she has a history of hypothermia for at least a year now, with temp usually haning around 94-95, prior workup was negative according to her. She is on several medications including amlodipine and recently started on namenda. No recent travel, no known sick contact. No significant exposure to cold weather, no exposure to illicit substances.   SUBJECTIVE: appears comfortable at rest  VITAL SIGNS: Temp:  [90.9 F (32.7 C)-98 F (36.7 C)] 98 F (36.7 C) (01/31 0800) Pulse Rate:  [40-107] 107 (01/31 0700) Resp:  [10-32] 13 (01/31 0800) BP: (91-173)/(36-82) 91/38 mmHg (01/31 0800) SpO2:  [95 %-100 %] 100 % (01/31 0843) FiO2 (%):  [30 %] 30 % (01/31 0200) Weight:  [123 lb (55.792 kg)-124 lb 5.4 oz (56.4 kg)] 124 lb 5.4 oz (56.4 kg) (01/31 0100) HEMODYNAMICS:   VENTILATOR SETTINGS: Vent Mode:  [-]  FiO2 (%):  [30 %] 30 % INTAKE / OUTPUT:  Intake/Output Summary (Last 24  hours) at 08/10/14 0911 Last data filed at 08/10/14 0800  Gross per 24 hour  Intake     50 ml  Output    850 ml  Net   -800 ml    PHYSICAL EXAMINATION: General:  More alert, unable to interact. On room air Neuro:  Grossly all extremities 5/5, pupils equal and reactive to light HEENT:  Atraumatic, no stridor Cardiovascular:  RRR, no loud murmur Lungs:  Clear bilaterally, no wheeze Abdomen:  Soft, nontender Musculoskeletal:  No edema. Has several deformities on her fingers with some restriction of finger movements Skin:  No open wounds, no ecchymosis  LABS:  CBC  Recent Labs Lab 08/09/14 1925 08/10/14 0105  WBC 3.6* 3.2*  HGB 9.2* 10.0*  HCT 27.0* 28.3*  PLT 225 197   Coag's  Recent Labs Lab 08/10/14 0105  APTT 35  INR 0.93   BMET  Recent Labs Lab 08/09/14 1925 08/10/14 0105  NA 138 140  K 3.8 4.4  CL 110 107  CO2 25 27  BUN 49* 46*  CREATININE 1.61* 1.61*  GLUCOSE 140* 100*   Electrolytes  Recent Labs Lab 08/09/14 1925 08/10/14 0105  CALCIUM 8.3* 8.6  MG  --  2.0  PHOS  --  4.3   Sepsis Markers  Recent Labs Lab 08/10/14 0106  LATICACIDVEN 0.9   ABG  Recent Labs Lab 08/09/14 2043 08/10/14 0136  PHART 7.326* 7.377  PCO2ART 47.3* 44.4  PO2ART 40.0* 142.0*   Liver Enzymes  Recent Labs Lab 08/10/14 0105  AST 25  ALT 15  ALKPHOS 96  BILITOT 0.4  ALBUMIN 2.9*   Cardiac Enzymes  Recent Labs Lab 08/09/14 1925  TROPONINI 0.03   Glucose  Recent Labs Lab 08/10/14 0051  GLUCAP 111*    Imaging Dg Chest 2 View  08/09/2014   CLINICAL DATA:  Wheezing and fever.  EXAM: CHEST  2 VIEW  COMPARISON:  PA and lateral chest 11/23/2013 in 08/17/2013.  FINDINGS: Lung volumes are somewhat low. There is cardiomegaly and mild interstitial edema. No consolidative process, pneumothorax or pleural effusion is identified.  IMPRESSION: Cardiomegaly and mild interstitial edema.   Electronically Signed   By: Drusilla Kanner M.D.   On: 08/09/2014  18:57     ASSESSMENT / PLAN:  PULMONARY OETT > none A: mild resp acidosis P:   On room air  D/c BiPAP due to confusion. She will not be able to tolerate it. Change bronchodilators to q6hrs No need abx for now  CARDIOVASCULAR CVL A: bradycardia >resolved. ? Amlodipine or namenda toxicity Ecg with 1st degree heart block  Normal lactic acid  Normal trops P:  Rewarming with Bear Hug Monitor clinically  Echo pending   RENAL A:  CKD st 3-4 P:   Baseline, monitor for now.  Check electrolytes Consider removal of foley   GASTROINTESTINAL A:  No acute issue P:   Swallow eval  She can start PO feeds if okay  HEMATOLOGIC A:  Chronic anemia Leukopenia  P:  No acute intervention  INFECTIOUS A:  Unlikely sepsis, no other clinical clues for it P:   BCx2 pending UC > pending No antibiotics indicated at this time  ENDOCRINE A:  Hypothermia > improving  Normal TSH P:   FT4, FT3, thiamine level, cortisol level pending  NEUROLOGIC A:  Dementia with agitation UDS negative.  P:   No acute intervention for now, Avoid sedative medication  Hold namenda for now   Transfer to SDU under family medicine teaching service. Discussed with on call resident, Dr Tawni Carnes. FM will assume care tomorrow 2/1.     FAMILY  - Updates: Discussion with family done bedside, she is full code for now, although daughter said that if she codes and she doesn't come back in about 10 minutes, then let her go in peace.  - Inter-disciplinary family meet or Palliative Care meeting due by:    Case discussed with Dr Sung Amabile   Signed:  Dow Adolph, MD PGY-3 Internal Medicine Teaching Service Pager: [phone] 08/10/2014, 9:11 AM    PCCM ATTENDING: I have reviewed pt's initial presentation, consultants notes and hospital database in detail.  The above assessment and plan was formulated under my direction.  Transfer out of ICU and back to FPTS. We have requested Palliative Care  input as we should be solely focussing on EOL issues @ this stage in this woman's life  Billy Fischer, MD;  PCCM service; Mobile [phone]

## 2014-08-10 NOTE — Progress Notes (Signed)
Patient grandaughter and daughter upset about patient being NPO and having safety mitts prior to palliative involvement. Family educated in the reason for NPO status and safety mitt used all have been discontinued after Dr. Phillips Odor address goals of care with Daughter [name].  Corliss Skains RN

## 2014-08-10 NOTE — Consult Note (Signed)
Palliative Medicine Team Consult Note  81 yo woman with advanced end stage dementia now in the ICU with hypotension and ?respiratory failure of unknown etiology and concern for overt aspiration. She has also been bradycardic in the 40's. PMT consulted for goals of care.  I called her daughter [name] who works here at [employer] in the admitting department- it was very difficult to talk with her over the phone due to an echo and background noise. I updated her on her mothers condition including a description of her current state which is that her mother is sobbing and crying out- clearly suffering. I also explained that hospitalization really could not reverse her current situation and was only making her suffer more as she approaches the end of her life. I asked her to consider comfort and dignity and requested that she allow me to try to get her more comfortable at minimum.  [name] was unwilling to consent to changing her code status to DNR despite a very strong medical recommendation and explaining how ACLS in the setting of cardiac arrest would not be medically indicated. I suspect a series of bad prior conversations on this topic have led to mistrust an high levels of feeling as if the decision is somehow not doing everything possible. This will require ongoing goals.  I made a very strong recommendation for Hospice Care and discharge home-Barbara agrees to this and is ok with me adding comfort and ongoing treatment of whatever her underlying condition is at present.   Issues causing most discomfort- desire to eat, mitt restraints and dyspnea presumably.   Discharge Home with hospice as soon as this can be arranged- I am going to reconcile her meds as much as possible to facilitate this.  Ongoing goals of care re: code status  Needs to go out of ICU  Remove Mitts-get a bedside sitter, remove all of her cardiac monitoring support her breathing with O2  Allow for small amounts of comfort  feeding- she does not appear to have an aspiration PNA so for the sake of her comfort small amounts of supervised food will be ok- I certainly would never medically recommend a feeding tube for dysphagia related to dementia-no evidence to suggest that this improves mortality and only leads to further complications.  SLP reasonable for best texture and education to family on aspiration prevention techniques.  Haldol 0.5mg  IV/SL q4 prn for agitation  Time: 3:00-3:35PM Total time: 35 minutes  Anderson Malta, DO Palliative Medicine [phone]

## 2014-08-10 NOTE — Progress Notes (Signed)
Echocardiogram 2D Echocardiogram has been performed.  Kathleen [name] 08/10/2014, 2:13 PM

## 2014-08-10 NOTE — Progress Notes (Signed)
Pt discharged to home with Daughter [name]. Discharge instructions reviewed with daughter.

## 2014-08-10 NOTE — Progress Notes (Signed)
Patient not tolerating Bipap well. Called Elink to notify. Spoke with Photographer. Patient placed on nasal cannula 3 liters. Gwendlyn Deutscher RT called and notified of change in oxygen therapy. Juanda Bond Critical Care Team , RN

## 2014-08-10 NOTE — Progress Notes (Signed)
Utilization review completed.  

## 2014-08-10 NOTE — Discharge Summary (Signed)
Physician Discharge Summary  Kathleen [name] NWG:[id number] DOB: 03/10/1923 DOA: 08/09/2014  PCP: Denny Levy, MD  Admit date: 08/09/2014 Discharge date: 08/10/2014  Time spent: >35 minutes  Recommendations for Outpatient Follow-up:  1. Hospice Referral 2. F/U with Dr. Jennette Kettle in next 3-5 days 3. Needs ongoing goals of care discussion and continued education around code status.  Discharge Diagnoses:  Active Problems:   Kidney disease, chronic, stage III (GFR 30-59 ml/min)   CHF (congestive heart failure)   Agitation   Respiratory failure   Hypothermia   Severe dementia   Dysphagia, pharyngoesophageal phase   Protein-calorie malnutrition, severe   Discharge Condition: stable, at baseline  Diet recommendation: Comfort feeding-soft food with full assistance  Filed Weights   08/09/14 1806 08/10/14 0100  Weight: 55.792 kg (123 lb) 56.4 kg (124 lb 5.4 oz)    History of present illness:    Hospital Course:  81 yo with advanced dementia, admitted to ICU with hypothermia and bradycardia, resolved upon admission-no signs of sepsis-antibiotics were discontinued, labs without significant abnormality. No aspiration PNA. Her albumin is low to suggest malnutrition state and failure to thrive all associated with late stages of dementia.  Procedures: ABG, normal Bipap used on admission and then discontinued, she responded well to nebulizer and bronchodilators. Consultations:  PCCM  Discharge Exam: Filed Vitals:   08/10/14 1700  BP: 151/76  Pulse: 101  Temp: 97.3 F (36.3 C)  Resp: 17   Much calmer with family present, mitts off, she is very happy to be going home- her distress has resolved. Responded well to small dose of haldol. Lungs are clear. She ate applesauce with no distress.  Discharge Instructions:  Current Discharge Medication List    START taking these medications   Details  Alum & Mag Hydroxide-Simeth (MAGIC MOUTHWASH) SOLN Take 10 mLs by mouth 4 (four) times  daily. Qty: 500 mL, Refills: 3    artificial tears (LACRILUBE) OINT ophthalmic ointment Place into both eyes every 4 (four) hours as needed for dry eyes. Refills: 0    bisacodyl (DULCOLAX) 10 MG suppository Place 1 suppository (10 mg total) rectally daily as needed for mild constipation. Qty: 12 suppository, Refills: 0    haloperidol (HALDOL) 2 MG/ML solution Take 0.5 mLs (1 mg total) by mouth every 6 (six) hours as needed for agitation. Qty: 100 mL, Refills: 0      CONTINUE these medications which have NOT CHANGED   Details  aspirin 81 MG tablet Take 1 tablet (81 mg total) by mouth daily. Qty: 30 tablet, Refills: 3    Melatonin 3 MG TABS Take by mouth.    PATADAY 0.2 % SOLN APPLY 1 DROP TO EYE 2 (TWO) TIMES DAILY. Qty: 2.5 mL, Refills: 1      STOP taking these medications     amLODipine (NORVASC) 5 MG tablet      ferrous sulfate (FERROUSUL) 325 (65 FE) MG tablet      memantine (NAMENDA XR) 7 MG CP24 24 hr capsule        No Known Allergies    The results of significant diagnostics from this hospitalization (including imaging, microbiology, ancillary and laboratory) are listed below for reference.    Significant Diagnostic Studies: Dg Chest 2 View  08/09/2014   CLINICAL DATA:  Wheezing and fever.  EXAM: CHEST  2 VIEW  COMPARISON:  PA and lateral chest 11/23/2013 in 08/17/2013.  FINDINGS: Lung volumes are somewhat low. There is cardiomegaly and mild interstitial edema. No consolidative process, pneumothorax  or pleural effusion is identified.  IMPRESSION: Cardiomegaly and mild interstitial edema.   Electronically Signed   By: Drusilla Kanner M.D.   On: 08/09/2014 18:57    Microbiology: Recent Results (from the past 240 hour(s))  MRSA PCR Screening     Status: None   Collection Time: 08/10/14  1:43 AM  Result Value Ref Range Status   MRSA by PCR NEGATIVE NEGATIVE Final    Comment:        The GeneXpert MRSA Assay (FDA approved for NASAL specimens only), is one  component of a comprehensive MRSA colonization surveillance program. It is not intended to diagnose MRSA infection nor to guide or monitor treatment for MRSA infections.      Labs: Basic Metabolic Panel:  Recent Labs Lab 08/09/14 1925 08/10/14 0105  NA 138 140  K 3.8 4.4  CL 110 107  CO2 25 27  GLUCOSE 140* 100*  BUN 49* 46*  CREATININE 1.61* 1.61*  CALCIUM 8.3* 8.6  MG  --  2.0  PHOS  --  4.3   Liver Function Tests:  Recent Labs Lab 08/10/14 0105  AST 25  ALT 15  ALKPHOS 96  BILITOT 0.4  PROT 7.0  ALBUMIN 2.9*   No results for input(s): LIPASE, AMYLASE in the last 168 hours. No results for input(s): AMMONIA in the last 168 hours. CBC:  Recent Labs Lab 08/09/14 1925 08/10/14 0105  WBC 3.6* 3.2*  NEUTROABS 1.9 1.4*  HGB 9.2* 10.0*  HCT 27.0* 28.3*  MCV 87.9 85.2  PLT 225 197   Cardiac Enzymes:  Recent Labs Lab 08/09/14 1925  TROPONINI 0.03   BNP: BNP (last 3 results)  Recent Labs  10/25/13 1433  PROBNP 714.70*   CBG:  Recent Labs Lab 08/10/14 0051  GLUCAP 111*       Signed:  Jaynia Fendley  [facility] 08/10/2014, 6:12 PM

## 2014-08-10 NOTE — ED Notes (Signed)
Pt alert and being transferred by [facility] now. Danna Hefty, RN

## 2014-08-10 NOTE — H&P (Deleted)
PULMONARY / CRITICAL CARE MEDICINE   Name: Kathleen [name] MRN: [id number] DOB: 11/05/1922    ADMISSION DATE:  08/09/2014 CONSULTATION DATE:    REFERRING MD :  [facility]  CHIEF COMPLAINT:  Dyspnea and altered mental status  INITIAL PRESENTATION: 77 with dementia with history of hypothermia, has cough + dyspnea + mild altered mental status, found to have temp 90.3, Brady in the 40-50, mild resp acidosis. On BiPAP and rewarming  STUDIES:    SIGNIFICANT EVENTS: 1/30 BiPAP started  HISTORY OF PRESENT ILLNESS:  Kathleen [name] who was in the NH for dementia for about a month, yesterday she was picked up by her daughter to be taken back to her house because they weren't happy with the care given there and missed meals. They noticed that she has been having cough with eating, dyspnea, wheezing, and mild alteration of mentation. Brought to ED where she was found to have Temp 90.3, sinus bradycardia 40-50 bpm, and mild resp acidosis, CXR shows some interstitial markings possibly fluid overload. Interestingly the faily said she has a history of hypothermia for at least a year now, with temp usually haning around 94-95, prior workup was negative according to her. She is on several medications including amlodipine and recently started on namenda. No recent travel, no known sick contact. No significant exposure to cold weather, no exposure to illicit substances.   SUBJECTIVE: appears comfortable at rest  VITAL SIGNS: Temp:  [90.9 [name] (32.7 C)-96.8 [name] (36 C)] 96.8 [name] (36 C) (01/31 0700) Pulse Rate:  [40-107] 107 (01/31 0700) Resp:  [10-32] 20 (01/31 0700) BP: (101-173)/(36-82) 152/59 mmHg (01/31 0700) SpO2:  [95 %-100 %] 100 % (01/31 0700) FiO2 (%):  [30 %] 30 % (01/31 0200) Weight:  [123 lb (55.792 kg)-124 lb 5.4 oz (56.4 kg)] 124 lb 5.4 oz (56.4 kg) (01/31 0100) HEMODYNAMICS:   VENTILATOR SETTINGS: Vent Mode:  [-]  FiO2 (%):  [30 %] 30 % INTAKE / OUTPUT:  Intake/Output Summary (Last 24  hours) at 08/10/14 0753 Last data filed at 08/10/14 0600  Gross per 24 hour  Intake     50 ml  Output    650 ml  Net   -600 ml    PHYSICAL EXAMINATION: General:  Agitation, unable to interact. On room air Neuro:  Grossly all extremities 5/5, pupils equal and reactive to light HEENT:  Atraumatic, no stridor Cardiovascular:  RRR, no loud murmur Lungs:  Clear bilaterally, no wheeze Abdomen:  Soft, nontender Musculoskeletal:  No edema. Has several deformities on her fingers with some restriction of finger movements Skin:  No open wounds, no ecchymosis  LABS:  CBC  Recent Labs Lab 08/09/14 1925 08/10/14 0105  WBC 3.6* 3.2*  HGB 9.2* 10.0*  HCT 27.0* 28.3*  PLT 225 197   Coag's  Recent Labs Lab 08/10/14 0105  APTT 35  INR 0.93   BMET  Recent Labs Lab 08/09/14 1925 08/10/14 0105  NA 138 140  K 3.8 4.4  CL 110 107  CO2 25 27  BUN 49* 46*  CREATININE 1.61* 1.61*  GLUCOSE 140* 100*   Electrolytes  Recent Labs Lab 08/09/14 1925 08/10/14 0105  CALCIUM 8.3* 8.6  MG  --  2.0  PHOS  --  4.3   Sepsis Markers  Recent Labs Lab 08/10/14 0106  LATICACIDVEN 0.9   ABG  Recent Labs Lab 08/09/14 2043 08/10/14 0136  PHART 7.326* 7.377  PCO2ART 47.3* Kathleen.4  PO2ART 40.0* 142.0*   Liver Enzymes  Recent Labs Lab 08/10/14 0105  AST 25  ALT 15  ALKPHOS 96  BILITOT 0.4  ALBUMIN 2.9*   Cardiac Enzymes  Recent Labs Lab 08/09/14 1925  TROPONINI 0.03   Glucose  Recent Labs Lab 08/10/14 0051  GLUCAP 111*    Imaging Dg Chest 2 View  08/09/2014   CLINICAL DATA:  Wheezing and fever.  EXAM: CHEST  2 VIEW  COMPARISON:  PA and lateral chest 11/23/2013 in 08/17/2013.  FINDINGS: Lung volumes are somewhat low. There is cardiomegaly and mild interstitial edema. No consolidative process, pneumothorax or pleural effusion is identified.  IMPRESSION: Cardiomegaly and mild interstitial edema.   Electronically Signed   By: Drusilla Kanner M.D.   On: 08/09/2014  18:57     ASSESSMENT / PLAN:  PULMONARY OETT > none A: mild resp acidosis P:   On room air  BiPAP may be difficult due to confusion agitation No need abx for now  CARDIOVASCULAR CVL A: bradycardia >resolved. ? Amlodipine or namenda toxicity Ecg with 1st degree heart block  Normal lactic acid  Normal trops P:  Rewarming with Bear Hug Monitor clinically  Echo pending   RENAL A:  CKD st 3-4 P:   Baseline, monitor for now.  Check electrolytes  GASTROINTESTINAL A:  No acute issue P:   Swallow eval  May start feeding if stable  HEMATOLOGIC A:  Chronic anemia P:  No acute intervention  INFECTIOUS A:  Unlikely sepsis, no other clinical clues for it P:   BCx2 pending UC > pending No antibiotics indicated at this time  ENDOCRINE A:  Hypothermia > improving  Normal TSH P:   FT4, FT3, thiamine level, cortisol level pending  NEUROLOGIC A:  Dementia with agitation UDS negative.  P:   No acute intervention for now, Avoid sedative medication  Hold namenda for now Will transfer to SDU     FAMILY  - Updates: Discussion with family done bedside, she is full code for now, although daughter said that if she codes and she doesn't come back in about 10 minutes, then let her go in peace.  - Inter-disciplinary family meet or Palliative Care meeting due by:   Case discussed with Dr Bard Herbert   Signed:  Dow Adolph, MD PGY-3 Internal Medicine Teaching Service Pager: [phone] 08/10/2014, 7:54 AM

## 2014-08-10 NOTE — Progress Notes (Signed)
Patient's daughter [name] arrived. She is asking to take her mother home tonight- especially given the fact that her breathing is better and we are really doing minimal additional treatments or interventions. She is agreeable to hospice referral. Otherwise, plan per my note. I will do the discharge as a courtesy to the primary team and to facilitate the best care plan for this patient.  Anderson Malta, DO Palliative Medicine [phone]

## 2014-08-10 NOTE — H&P (Signed)
PULMONARY / CRITICAL CARE MEDICINE   Name: Kathleen [name] MRN: [id number] DOB: September 24, 1922    ADMISSION DATE:  08/09/2014 CONSULTATION DATE:    REFERRING MD :  [facility]  CHIEF COMPLAINT:  Dyspnea and altered mental status  INITIAL PRESENTATION: 74 with dementia with history of hypothermia, has cough + dyspnea + mild altered mental status, found to have Temp 90.3, Brady in the 40-50, mild resp acidosis. On BiPAP and rewarming  STUDIES:    SIGNIFICANT EVENTS: 1/30 BiPAP started   HISTORY OF PRESENT ILLNESS:  75F who was in the NH for dementia for about a month, yesterday she was picked up by her daughter to be taken back to her house because they weren't happy with the care given there and missed meals. They noticed that she has been having cough with eating, dyspnea, wheezing, and mild alteration of mentation. Brought to ED where she was found to have Temp 90.3, sinus bradycardia 40-50 bpm, and mild resp acidosis, CXR shows some interstitial markings possibly fluid overload. Interestingly the faily said she has a history of hypothermia for at least a year now, with temp usually haning around 94-95, prior workup was negative according to her. She is on several medications including amlodipine and recently started on namenda. No recent travel, no known sick contact. No significant exposure to cold weather, no exposure to illicit substances.  PAST MEDICAL HISTORY :   has a past medical history of Hypertension; Arthritis; Spinal stenosis; Osteoarthritis; Anemia; CHF (congestive heart failure); Heart murmur; Osteoporosis; Chronic kidney disease; Generalized OA (12/16/2011); Dementia; SunDown syndrome; Atrial fibrillation; Wenckebach second degree AV block (02/22/2013); Paroxysmal atrial fibrillation (10/13/2010); and CHF (congestive heart failure).  has past surgical history that includes Nephrectomy (Right) and Tonsillectomy. Prior to Admission medications   Medication Sig Start Date End  Date Taking? Authorizing Provider  amLODipine (NORVASC) 5 MG tablet TAKE 1 TABLET EVERY DAY   Yes Nestor Ramp, MD  aspirin 81 MG tablet Take 1 tablet (81 mg total) by mouth daily. 06/01/13  Yes Lonia Skinner, MD  ferrous sulfate (FERROUSUL) 325 (65 FE) MG tablet Take 325 mg by mouth daily.  04/02/12  Yes Zachery Dauer, MD  Melatonin 3 MG TABS Take by mouth.   Yes Historical Provider, MD  memantine (NAMENDA XR) 7 MG CP24 24 hr capsule Take 7 mg by mouth daily.   Yes Historical Provider, MD  PATADAY 0.2 % SOLN APPLY 1 DROP TO EYE 2 (TWO) TIMES DAILY. 02/28/14  Yes Nestor Ramp, MD   No Known Allergies  FAMILY HISTORY:  has no family status information on file.  SOCIAL HISTORY:  reports that she has quit smoking. She has never used smokeless tobacco. She reports that she does not drink alcohol or use illicit drugs.  REVIEW OF SYSTEMS:  Difficult to obtain due to patient's dementia but denies chest pain, nausea, rash, abd pain, headache, or vision changes  SUBJECTIVE: appears comfortable at rest  VITAL SIGNS: Temp:  [90.9 F (32.7 C)-92.5 F (33.6 C)] 92.5 F (33.6 C) (01/30 2352) Pulse Rate:  [40-71] 71 (01/31 0050) Resp:  [10-26] 26 (01/31 0050) BP: (101-173)/(45-82) 153/80 mmHg (01/31 0015) SpO2:  [95 %-100 %] 100 % (01/30 2330) FiO2 (%):  [30 %] 30 % (01/30 2129) Weight:  [55.792 kg (123 lb)] 55.792 kg (123 lb) (01/30 1806) HEMODYNAMICS:   VENTILATOR SETTINGS: Vent Mode:  [-]  FiO2 (%):  [30 %] 30 % INTAKE / OUTPUT:  Intake/Output Summary (Last 24  hours) at 08/10/14 0122 Last data filed at 08/10/14 0008  Gross per 24 hour  Intake      0 ml  Output    200 ml  Net   -200 ml    PHYSICAL EXAMINATION: General:  Awake, alert, oriented to person Neuro:  Grossly all extremities 5/5, no preferential gaze, pupils equal and reactive to light HEENT:  Atraumatic, no stridor Cardiovascular:  RRR, no loud murmur Lungs:  Mild intermittent inspiratory rales both bases, no  wheeze Abdomen:  Soft, nontender Musculoskeletal:  Has several deformities on her fingers with some restriction of finger movements, mild leg swelling Skin:  No open wounds, no ecchymosis  LABS:  CBC  Recent Labs Lab 08/09/14 1925  WBC 3.6*  HGB 9.2*  HCT 27.0*  PLT 225   Coag's No results for input(s): APTT, INR in the last 168 hours. BMET  Recent Labs Lab 08/09/14 1925  NA 138  K 3.8  CL 110  CO2 25  BUN 49*  CREATININE 1.61*  GLUCOSE 140*   Electrolytes  Recent Labs Lab 08/09/14 1925  CALCIUM 8.3*   Sepsis Markers No results for input(s): LATICACIDVEN, PROCALCITON, O2SATVEN in the last 168 hours. ABG  Recent Labs Lab 08/09/14 2043  PHART 7.326*  PCO2ART 47.3*  PO2ART 40.0*   Liver Enzymes No results for input(s): AST, ALT, ALKPHOS, BILITOT, ALBUMIN in the last 168 hours. Cardiac Enzymes  Recent Labs Lab 08/09/14 1925  TROPONINI 0.03   Glucose  Recent Labs Lab 08/10/14 0051  GLUCAP 111*    Imaging Dg Chest 2 View  08/09/2014   CLINICAL DATA:  Wheezing and fever.  EXAM: CHEST  2 VIEW  COMPARISON:  PA and lateral chest 11/23/2013 in 08/17/2013.  FINDINGS: Lung volumes are somewhat low. There is cardiomegaly and mild interstitial edema. No consolidative process, pneumothorax or pleural effusion is identified.  IMPRESSION: Cardiomegaly and mild interstitial edema.   Electronically Signed   By: Drusilla Kanner M.D.   On: 08/09/2014 18:57     ASSESSMENT / PLAN:  PULMONARY OETT A: mild resp acidosis P:   BiPAP support and wean as tolerated, keep NPO for now except meds. May have sips of water during breaks Recheck ABG No need abx for now  CARDIOVASCULAR CVL A: bradycardia P:  Possibly due to hypothermia, active rewarming should correct this May be due to amlodipine toxicity or even namenda toxicity, that being said, she is on the more stable side right now thus will only monitor. If deteriorates then will consider empiric treatment  for CCB toxicity. Echo ordered. Check lactate  RENAL A:  CKD st 3-4 P:   Baseline, monitor for now. Check electrolytes  GASTROINTESTINAL A:  No acute issue P:   Start PO intake in AM if patient stable  HEMATOLOGIC A:  Chronic anemia P:  No acute intervention  INFECTIOUS A:  Unlikely sepsis, no other clinical clues for it P:   BCx2 pending UC  Sputum Abx  ENDOCRINE A:  Hypothermia   P:   Will check TSH, FT4, FT3, thiamine level, cortisol level  NEUROLOGIC A:  Dementia P:   No acute intervention for now, avoid meds that can cause delirium Stop namenda for now since there is a small possibility that it may be contributing to her acute problems Check urine drug screen RASS goal:     FAMILY  - Updates: discussion with family done bedside, she is full code for now, although daughter said that if she codes and she  doesn't come back in about 10 minutes, then let her go in peace.  - Inter-disciplinary family meet or Palliative Care meeting due by:      TODAY'S SUMMARY: admitted for hypothermia and bradycardia, mild resp acidosis, seems to be doing fine on BiPAP and rewarming.   Critical Care time: 35 minutes  Wadie Lessen Pulmonary and Critical Care Medicine [facility] Pager: [phone]  08/10/2014, 1:22 AM

## 2014-08-11 ENCOUNTER — Other Ambulatory Visit: Payer: Self-pay | Admitting: Family Medicine

## 2014-08-11 ENCOUNTER — Telehealth: Payer: Self-pay | Admitting: *Deleted

## 2014-08-11 DIAGNOSIS — J9801 Acute bronchospasm: Secondary | ICD-10-CM

## 2014-08-11 LAB — URINE CULTURE
COLONY COUNT: NO GROWTH
Culture: NO GROWTH
Special Requests: NORMAL

## 2014-08-11 LAB — T3, FREE: T3 FREE: 2 pg/mL (ref 2.0–4.4)

## 2014-08-11 MED ORDER — ALBUTEROL SULFATE (2.5 MG/3ML) 0.083% IN NEBU: 2.5000 mg | INHALATION_SOLUTION | Freq: Four times a day (QID) | RESPIRATORY_TRACT | Status: AC | PRN

## 2014-08-11 NOTE — Telephone Encounter (Signed)
Received a message from [facility] RN requesting a referral for hospice and palliative care for patient.  Pt was discharge from hospital this past weekend.  Referral request form was faxed to PCP along with discharge summary.  Please call [phone].  Clovis Pu, RN

## 2014-08-11 NOTE — Progress Notes (Signed)
Seen at ED yesterday with some wheezing. D/c home. Daughter says she is still wheezing a bit. I doubt she can use MDI--will try a  Neb machine.Kathleen [name]

## 2014-08-11 NOTE — Telephone Encounter (Signed)
Phone note closed by accident.  Clovis Pu, RN

## 2014-08-12 ENCOUNTER — Telehealth: Payer: Self-pay | Admitting: *Deleted

## 2014-08-12 NOTE — Telephone Encounter (Signed)
Pt's granddaughter called 08/11/2014 stating that her pt was wheezing.  Pt was recently discharged from hospital.  She wanted to know what can they do to help at home.  Home nebulizer and neb solutions were ordered by PCP.  [employer] Forms where completed for the neb machine by pt's daughter.  Pt to follow up with PCP.  Clovis Pu, RN

## 2014-08-14 LAB — VITAMIN B1: VITAMIN B1 (THIAMINE): 17 nmol/L (ref 8–30)

## 2014-08-16 LAB — CULTURE, BLOOD (ROUTINE X 2)
CULTURE: NO GROWTH
Culture: NO GROWTH

## 2014-08-20 ENCOUNTER — Ambulatory Visit: Payer: Medicare Other | Admitting: Family Medicine

## 2014-08-25 NOTE — Progress Notes (Signed)
Patient ID: Kathleen [name], female   DOB: 1922-11-25, 81 y.o.   MRN: [id number]  Kathleen [name] living [place]     No Known Allergies     Chief Complaint  Patient presents with  . Medical Management of Chronic Issues    HPI:  She is a resident of this facility being seen for the management of her chronic illnesses. Her family is concerned about her nutritional status; we discussed her weight last month was 118 pounds this month is 120.4 pounds. Her albumin is 3.7. Her family is worried about her sleeping all day and not at night. We discussed her change in environment this past month could be a contributing factor in her insomnia. Will start her on melatonin and mvi.   Past Medical History  Diagnosis Date  . Hypertension   . Arthritis   . Spinal stenosis   . Osteoarthritis   . Anemia   . CHF (congestive heart failure)   . Heart murmur     aortic regurg  . Osteoporosis   . Chronic kidney disease   . Generalized OA 12/16/2011    Significant. Seems to have encompass all joints. Patient is complaining of significant pain but dementia allows patient to be happy.  . Dementia   . SunDown syndrome   . Atrial fibrillation   . 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 Paroxysmal atrial fibrillation 10/13/2010    Rate controlled Only on aspirin due to hx of bicep hematoma 09/2010) and significant fall risk   . CHF (congestive heart failure)     Past Surgical History  Procedure Laterality Date  . Nephrectomy Right   . Tonsillectomy      VITAL SIGNS BP 134/70 mmHg  Pulse 100  Ht 4\' 10"  (1.473 m)  Wt 120 lb 6.4 oz (54.613 kg)  BMI 25.17 kg/m2   Outpatient Encounter Prescriptions as of 07/28/2014  Medication Sig  . aspirin 81 MG tablet Take 1 tablet (81 mg total) by mouth daily.   norvasc 5 mg  Take 5 mg daily    Iron  325 mg daily   . PATADAY 0.2 % SOLN APPLY 1 DROP TO EYE 2 (TWO) TIMES DAILY.     SIGNIFICANT DIAGNOSTIC EXAMS    LABS REVIEWED:   06-25-14: wbc  5.4; hgb 9.7; hct 28.5; mcv 87.4; plt 205; glucose 64; bun 45; creat 1.48; k+5.0; na++145; liver normal albumin 3.7    ROS   Physical Exam Constitutional: No distress.  Thin   Neck: Neck supple. No JVD present. No thyromegaly present.  Cardiovascular: Normal rate, regular rhythm and intact distal pulses.   Respiratory: Effort normal and breath sounds normal. No respiratory distress.  GI: Soft. Bowel sounds are normal. She exhibits no distension. There is no tenderness.  Musculoskeletal: She exhibits edema.  Has arthritic changes in both hands Is able to move all extremities Has stiff posture  Has bilateral lower extremity edema; trace   Skin: She is not diaphoretic.       ASSESSMENT/ PLAN:  1. Hypertension; is stable will continue norvasc 5 mg daily and asa 81 mg daily and will monitor   2. Anemia of chronic disease; will continue iron daily   3. Stage III CKD: creat is 1.48  will continue to monitor her status.   4. Dementia without behavioral disturbance: she is without significant change in her status;  She is presently not on medications; will not make changes and will monitor   5. CHF: she is stable  has trace pedal present; is presently not taking medications will monitor  6. Osteoarthritis: she does have deformities present; she states this interferes with her ability to feed herself. Will monitor her status.   7. Insomnia: will begin melatonin 3 mg nightly        Synthia Innocent NP [facility] Adult [facility]  Contact [phone] Monday through Friday 8am- 5pm  After hours call [phone]

## 2014-08-29 ENCOUNTER — Telehealth: Payer: Self-pay | Admitting: *Deleted

## 2014-08-29 NOTE — Telephone Encounter (Signed)
Received a message from [facility] stating that pt's daughter has revoked all hospice services as of 08/28/2014.  Clovis Pu, RN

## 2014-08-30 NOTE — Progress Notes (Signed)
Patient ID: Kathleen [name], female   DOB: January 18, 1923, 81 y.o.   MRN: [id number]  Kathleen [name] living [place]     No Known Allergies     Chief Complaint  Patient presents with  . Discharge Note    HPI:  She is being discharged to home with home health for pt/ot/nursing to improve upon her strength; mobility; independence with adl's and medication management. She will not need dme. She will need her prescriptions to be written and a follow up appointment with her pcp.    Past Medical History  Diagnosis Date  . Hypertension   . Arthritis   . Spinal stenosis   . Osteoarthritis   . Anemia   . CHF (congestive heart failure)   . Heart murmur     aortic regurg  . Osteoporosis   . Chronic kidney disease   . Generalized OA 12/16/2011    Significant. Seems to have encompass all joints. Patient is complaining of significant pain but dementia allows patient to be happy.  . Dementia   . SunDown syndrome   . Atrial fibrillation   . 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 Paroxysmal atrial fibrillation 10/13/2010    Rate controlled Only on aspirin due to hx of bicep hematoma 09/2010) and significant fall risk   . CHF (congestive heart failure)     Past Surgical History  Procedure Laterality Date  . Nephrectomy Right   . Tonsillectomy      VITAL SIGNS BP 129/84 mmHg  Pulse 100  Ht 4\' 10"  (1.473 m)  Wt 120 lb 6.4 oz (54.613 kg)  BMI 25.17 kg/m2   Outpatient Encounter Prescriptions as of 08/07/2014  Medication Sig  . aspirin 81 MG tablet Take 1 tablet (81 mg total) by mouth daily.  Marland Kitchen PATADAY 0.2 % SOLN APPLY 1 DROP TO EYE 2 (TWO) TIMES DAILY.     SIGNIFICANT DIAGNOSTIC EXAMS    LABS REVIEWED:   06-25-14: wbc 5.4; hgb 9.7; hct 28.5; mcv 87.4; plt 205; glucose 64; bun 45; creat 1.48; k+5.0; na++145; liver normal albumin 3.7        Review of Systems  Unable to perform ROS    Physical Exam Constitutional: No distress.  Thin   Neck: Neck supple. No JVD  present. No thyromegaly present.  Cardiovascular: Normal rate, regular rhythm and intact distal pulses.   Respiratory: Effort normal and breath sounds normal. No respiratory distress.  GI: Soft. Bowel sounds are normal. She exhibits no distension. There is no tenderness.  Musculoskeletal: She exhibits edema.  Has arthritic changes in both hands Is able to move all extremities Has stiff posture  Has bilateral lower extremity edema; trace   Skin: She is not diaphoretic.      ASSESSMENT/ PLAN:  Will discharge her to home with home health for pt/ot/nursing services. She will not need dme. Her prescriptions have been written for a 30 day supply of her medications. She has a follow up appointment with Dr. Randall An.     Time spent with patient 40 minutes.   Synthia Innocent NP [facility] Adult Medicine  Contact [phone] Monday through Friday 8am- 5pm  After hours call [phone]

## 2014-09-09 ENCOUNTER — Emergency Department (HOSPITAL_COMMUNITY): Payer: Medicare Other

## 2014-09-09 ENCOUNTER — Encounter (HOSPITAL_COMMUNITY): Payer: Self-pay | Admitting: Radiology

## 2014-09-09 ENCOUNTER — Emergency Department (HOSPITAL_COMMUNITY)
Admission: EM | Admit: 2014-09-09 | Discharge: 2014-09-09 | Disposition: A | Discharge status: Home / Self Care | Payer: Medicare Other | Attending: Emergency Medicine | Admitting: Emergency Medicine

## 2014-09-09 DIAGNOSIS — Z7982 Long term (current) use of aspirin: Secondary | ICD-10-CM | POA: Insufficient documentation

## 2014-09-09 DIAGNOSIS — Z79899 Other long term (current) drug therapy: Secondary | ICD-10-CM | POA: Insufficient documentation

## 2014-09-09 DIAGNOSIS — N179 Acute kidney failure, unspecified: Secondary | ICD-10-CM | POA: Diagnosis not present

## 2014-09-09 DIAGNOSIS — R4182 Altered mental status, unspecified: Secondary | ICD-10-CM | POA: Diagnosis present

## 2014-09-09 DIAGNOSIS — F0390 Unspecified dementia without behavioral disturbance: Secondary | ICD-10-CM | POA: Diagnosis not present

## 2014-09-09 DIAGNOSIS — I129 Hypertensive chronic kidney disease with stage 1 through stage 4 chronic kidney disease, or unspecified chronic kidney disease: Secondary | ICD-10-CM | POA: Insufficient documentation

## 2014-09-09 DIAGNOSIS — Z862 Personal history of diseases of the blood and blood-forming organs and certain disorders involving the immune mechanism: Secondary | ICD-10-CM | POA: Insufficient documentation

## 2014-09-09 DIAGNOSIS — I509 Heart failure, unspecified: Secondary | ICD-10-CM | POA: Insufficient documentation

## 2014-09-09 DIAGNOSIS — Z87891 Personal history of nicotine dependence: Secondary | ICD-10-CM | POA: Insufficient documentation

## 2014-09-09 DIAGNOSIS — N189 Chronic kidney disease, unspecified: Secondary | ICD-10-CM | POA: Diagnosis not present

## 2014-09-09 DIAGNOSIS — R011 Cardiac murmur, unspecified: Secondary | ICD-10-CM | POA: Insufficient documentation

## 2014-09-09 DIAGNOSIS — R410 Disorientation, unspecified: Secondary | ICD-10-CM

## 2014-09-09 DIAGNOSIS — I480 Paroxysmal atrial fibrillation: Secondary | ICD-10-CM | POA: Diagnosis not present

## 2014-09-09 DIAGNOSIS — M1990 Unspecified osteoarthritis, unspecified site: Secondary | ICD-10-CM | POA: Diagnosis not present

## 2014-09-09 LAB — CBC WITH DIFFERENTIAL/PLATELET
Basophils Absolute: 0 10*3/uL (ref 0.0–0.1)
Basophils Relative: 0 % (ref 0–1)
EOS ABS: 0 10*3/uL (ref 0.0–0.7)
Eosinophils Relative: 0 % (ref 0–5)
HCT: 29.6 % — ABNORMAL LOW (ref 36.0–46.0)
Hemoglobin: 10.4 g/dL — ABNORMAL LOW (ref 12.0–15.0)
LYMPHS ABS: 2.8 10*3/uL (ref 0.7–4.0)
Lymphocytes Relative: 30 % (ref 12–46)
MCH: 29.9 pg (ref 26.0–34.0)
MCHC: 35.1 g/dL (ref 30.0–36.0)
MCV: 85.1 fL (ref 78.0–100.0)
Monocytes Absolute: 0.5 10*3/uL (ref 0.1–1.0)
Monocytes Relative: 6 % (ref 3–12)
NEUTROS PCT: 64 % (ref 43–77)
Neutro Abs: 5.9 10*3/uL (ref 1.7–7.7)
PLATELETS: 183 10*3/uL (ref 150–400)
RBC: 3.48 MIL/uL — ABNORMAL LOW (ref 3.87–5.11)
RDW: 15.4 % (ref 11.5–15.5)
WBC: 9.3 10*3/uL (ref 4.0–10.5)

## 2014-09-09 LAB — URINALYSIS, ROUTINE W REFLEX MICROSCOPIC
BILIRUBIN URINE: NEGATIVE
GLUCOSE, UA: NEGATIVE mg/dL
HGB URINE DIPSTICK: NEGATIVE
KETONES UR: NEGATIVE mg/dL
Leukocytes, UA: NEGATIVE
Nitrite: NEGATIVE
PROTEIN: 30 mg/dL — AB
Specific Gravity, Urine: 1.02 (ref 1.005–1.030)
UROBILINOGEN UA: 0.2 mg/dL (ref 0.0–1.0)
pH: 5 (ref 5.0–8.0)

## 2014-09-09 LAB — COMPREHENSIVE METABOLIC PANEL
ALT: 29 U/L (ref 0–35)
ANION GAP: 8 (ref 5–15)
AST: 31 U/L (ref 0–37)
Albumin: 2.9 g/dL — ABNORMAL LOW (ref 3.5–5.2)
Alkaline Phosphatase: 92 U/L (ref 39–117)
BUN: 48 mg/dL — AB (ref 6–23)
CALCIUM: 9.3 mg/dL (ref 8.4–10.5)
CO2: 27 mmol/L (ref 19–32)
Chloride: 108 mmol/L (ref 96–112)
Creatinine, Ser: 2.29 mg/dL — ABNORMAL HIGH (ref 0.50–1.10)
GFR calc Af Amer: 21 mL/min — ABNORMAL LOW (ref >90)
GFR calc non Af Amer: 18 mL/min — ABNORMAL LOW (ref >90)
GLUCOSE: 118 mg/dL — AB (ref 70–99)
Potassium: 4.4 mmol/L (ref 3.5–5.1)
SODIUM: 143 mmol/L (ref 135–145)
Total Bilirubin: 0.6 mg/dL (ref 0.3–1.2)
Total Protein: 7.6 g/dL (ref 6.0–8.3)

## 2014-09-09 LAB — I-STAT TROPONIN, ED: TROPONIN I, POC: 0.02 ng/mL (ref 0.00–0.08)

## 2014-09-09 LAB — URINE MICROSCOPIC-ADD ON

## 2014-09-09 MED ORDER — SODIUM CHLORIDE 0.9 % IV BOLUS (SEPSIS)
500.0000 mL | Freq: Once | INTRAVENOUS | Status: AC
  Administered 2014-09-09: 500 mL via INTRAVENOUS

## 2014-09-09 MED ORDER — SODIUM CHLORIDE 0.9 % IV BOLUS (SEPSIS)
1000.0000 mL | Freq: Once | INTRAVENOUS | Status: AC
  Administered 2014-09-09: 1000 mL via INTRAVENOUS

## 2014-09-09 NOTE — ED Notes (Signed)
Completed orthostatics laying down and sitting up in bed. However, pt is unable to stand.

## 2014-09-09 NOTE — ED Provider Notes (Addendum)
CSN: [id number]     Arrival date & time 09/09/14  1032 History   First MD Initiated Contact with Patient 09/09/14 1035     Chief Complaint  Patient presents with  . Altered Mental Status     (Consider location/radiation/quality/duration/timing/severity/associated sxs/prior Treatment) The history is provided by a relative.  Kathleen [name] is a 81 y.o. female hx of dementia, CHF here with AMS. As per daughter, she was less responsive for the last week or so. She also stopped eating for the last 3 days. She has been more disoriented as well. Patient can't give any history. Was admitted a month ago for bradycardia and hypothermia. She is DNR/DNI.    Level V caveat- AMS, dementia   Past Medical History  Diagnosis Date  . Hypertension   . Arthritis   . Spinal stenosis   . Osteoarthritis   . Anemia   . CHF (congestive heart failure)   . Heart murmur     aortic regurg  . Osteoporosis   . Chronic kidney disease   . Generalized OA 12/16/2011    Significant. Seems to have encompass all joints. Patient is complaining of significant pain but dementia allows patient to be happy.  . Dementia   . SunDown syndrome   . Atrial fibrillation   . 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 Paroxysmal atrial fibrillation 10/13/2010    Rate controlled Only on aspirin due to hx of bicep hematoma 09/2010) and significant fall risk   . CHF (congestive heart failure)    Past Surgical History  Procedure Laterality Date  . Nephrectomy Right   . Tonsillectomy     Family History  Problem Relation Age of Onset  . Hypertension Daughter    History  Substance Use Topics  . Smoking status: Former Games developer  . Smokeless tobacco: Never Used     Comment: quit smoking in the 70's  . Alcohol Use: No   OB History    No data available     Review of Systems  Unable to perform ROS: Dementia      Allergies  Review of patient's allergies indicates no known allergies.  Home Medications   Prior to  Admission medications   Medication Sig Start Date End Date Taking? Authorizing Provider  albuterol (PROVENTIL) (2.5 MG/3ML) 0.083% nebulizer solution Take 3 mLs (2.5 mg total) by nebulization every 6 (six) hours as needed for wheezing or shortness of breath. 08/11/14   Nestor Ramp, MD  Alum & Mag Hydroxide-Simeth (MAGIC MOUTHWASH) SOLN Take 10 mLs by mouth 4 (four) times daily. 08/10/14   Edsel Petrin, DO  artificial tears (LACRILUBE) OINT ophthalmic ointment Place into both eyes every 4 (four) hours as needed for dry eyes. 08/10/14   Edsel Petrin, DO  aspirin 81 MG tablet Take 1 tablet (81 mg total) by mouth daily. 06/01/13   Lonia Skinner, MD  bisacodyl (DULCOLAX) 10 MG suppository Place 1 suppository (10 mg total) rectally daily as needed for mild constipation. 08/10/14   Edsel Petrin, DO  haloperidol (HALDOL) 2 MG/ML solution Take 0.5 mLs (1 mg total) by mouth every 6 (six) hours as needed for agitation. 08/10/14   Edsel Petrin, DO  Melatonin 3 MG TABS Take by mouth.    Historical Provider, MD  PATADAY 0.2 % SOLN APPLY 1 DROP TO EYE 2 (TWO) TIMES DAILY. 02/28/14   Nestor Ramp, MD   BP 168/101 mmHg  Pulse 82  Temp(Src) 98.6 F (37  C) (Rectal)  Resp 19  Ht 4' (1.219 m)  Wt 120 lb (54.432 kg)  BMI 36.63 kg/m2  SpO2 98% Physical Exam  Constitutional:  Demented, disoriented   HENT:  Head: Normocephalic.  MM slightly dry   Eyes: Conjunctivae are normal. Pupils are equal, round, and reactive to light.  Neck: Normal range of motion. Neck supple.  Cardiovascular: Normal rate, regular rhythm and normal heart sounds.   Pulmonary/Chest: Effort normal.  Diminished bilateral bases   Abdominal: Soft. Bowel sounds are normal. She exhibits no distension. There is no tenderness. There is no rebound.  Musculoskeletal: Normal range of motion. She exhibits no edema or tenderness.  Neurological:  Poor mental status, not lethargic. Moving all extremities   Skin: Skin is warm  and dry.  Psychiatric:  Unable   Nursing note and vitals reviewed.   ED Course  Procedures (including critical care time) Labs Review Labs Reviewed  CBC WITH DIFFERENTIAL/PLATELET - Abnormal; Notable for the following:    RBC 3.48 (*)    Hemoglobin 10.4 (*)    HCT 29.6 (*)    All other components within normal limits  COMPREHENSIVE METABOLIC PANEL - Abnormal; Notable for the following:    Glucose, Bld 118 (*)    BUN 48 (*)    Creatinine, Ser 2.29 (*)    Albumin 2.9 (*)    GFR calc non Af Amer 18 (*)    GFR calc Af Amer 21 (*)    All other components within normal limits  URINALYSIS, ROUTINE W REFLEX MICROSCOPIC - Abnormal; Notable for the following:    Protein, ur 30 (*)    All other components within normal limits  URINE MICROSCOPIC-ADD ON - Abnormal; Notable for the following:    Squamous Epithelial / LPF FEW (*)    All other components within normal limits  URINE CULTURE  I-STAT TROPOININ, ED    Imaging Review Dg Chest 2 View  09/09/2014   CLINICAL DATA:  Weakness and confusion  EXAM: CHEST  2 VIEW  COMPARISON:  08/09/2014  FINDINGS: Cardiac shadow remains enlarged. The lungs are well aerated bilaterally. No focal infiltrate or sizable effusion is seen. Elevation the right hemidiaphragm is again noted.  IMPRESSION: No active cardiopulmonary disease.   Electronically Signed   By: Alcide Clever M.D.   On: 09/09/2014 11:01   Ct Head Wo Contrast  09/09/2014   CLINICAL DATA:  Increasing disorientation  EXAM: CT HEAD WITHOUT CONTRAST  TECHNIQUE: Contiguous axial images were obtained from the base of the skull through the vertex without intravenous contrast.  COMPARISON:  09/16/2013  FINDINGS: Bony calvarium is intact. Chronic opacification of the sphenoid sinus on the right is seen. Atrophic changes are noted with ventricular dilatation. Chronic white matter ischemic changes noted as well. No findings to suggest acute hemorrhage, acute infarction or space-occupying mass lesion are  noted.  IMPRESSION: Chronic atrophic and ischemic changes are noted.  No acute abnormality is seen.   Electronically Signed   By: Alcide Clever M.D.   On: 09/09/2014 11:23     EKG Interpretation   Date/Time:  Tuesday September 09 2014 11:10:28 EST Ventricular Rate:  79 PR Interval:  199 QRS Duration: 85 QT Interval:  381 QTC Calculation: 437 R Axis:   -15 Text Interpretation:  Sinus or ectopic atrial rhythm Supraventricular  bigeminy Left ventricular hypertrophy Anterior infarct, old ST elevation  unchanged since previous Confirmed by Azarria Balint  MD, Peaches Vanoverbeke ([id number]) on 09/09/2014  11:13:08 AM  MDM   Final diagnoses:  None    Kathleen [name] is a 81 y.o. female here with AMS. Consider stroke vs infection vs electrolyte abnormality. Will get CT head, labs, CXR, UA. Will get EKG.   11 AM EKG showed possible STEMI. I talked with Dr. Eldridge Dace, who felt that it is unchanged from Jan 31st. Given that she is demented and palliative care patient and lack of chest pain, will not activate STEMI. Repeat EKG unchanged. Trop neg x 1.  12:11 PM Cr 2.6, baseline 1.6. Unable to stand for orthostatics. UA and CXR and CT head unremarkable. Will admit to family practice    12:46 PM Family practice saw patient and discussed with daughter. She doesn't want mom to be admitted and wants to get more IVF. Will give another 1 L and she has f/u tomorrow in clinic.   Richardean Canal, MD 09/09/14 1212  Richardean Canal, MD 09/09/14 [phone]

## 2014-09-09 NOTE — Consult Note (Signed)
FPTS Consult Note HPI: MAHLAYA KIELER is a 81 y.o. F with PMH significant for dementia, HTN, CHF, and A.fib. Patient is unable to provide history as she is demented. Family states that patient has not been eating like her usual self the last couple of days. They say this is a common thing for her as she does this about once every month. They say it is progressively getting worse. Family was concerned because patient appeared dehydrated and also had AMS.   ROS was unremarkable per family. Patient does not endorse any complaints.   PE: Alert, awake, and responsive Tachycardic, normal heart sounds Lungs CTAB Abdomen soft and non-tender Skin warm and dry  Labs unremarkable Imaging not significant for any acute processes.  A/P: ALAYSHIA LANCE is a 81 y.o. F who presented to ED with AMS and decreased oral intake 2/2 worsening dementia. Noted to be dehydrated. VSS.Other sources of AMS r/o such as infection, medication change, etc.  -From our standpoint patient can be discharged home after adequate fluid hydration. She has an appointment in our clinic tomorrow and will be re-examined at that time.  -Spoke with family and they do not want an admission at this time if patient does not have any indication.  -Would suggest further discussions with family about future hospitalizations and palliative care.

## 2014-09-09 NOTE — ED Notes (Signed)
Pt here for increased disorientation over several days, sts temperature has been abnormal up and down and baseline is 94 for pt. Pt alert and disoriented on assessment. Family reports decreased apetite and fluid intake.

## 2014-09-09 NOTE — Discharge Instructions (Signed)
Follow up with family practice tomorrow.   Return to ER if you have more confusion, dehydration, vomiting.

## 2014-09-09 NOTE — ED Notes (Signed)
MD at bedside. 

## 2014-09-10 ENCOUNTER — Ambulatory Visit (INDEPENDENT_AMBULATORY_CARE_PROVIDER_SITE_OTHER): Payer: Medicare Other | Admitting: Family Medicine

## 2014-09-10 VITALS — BP 138/52 | HR 54

## 2014-09-10 DIAGNOSIS — F03C0 Unspecified dementia, severe, without behavioral disturbance, psychotic disturbance, mood disturbance, and anxiety: Secondary | ICD-10-CM

## 2014-09-10 DIAGNOSIS — F0390 Unspecified dementia without behavioral disturbance: Secondary | ICD-10-CM

## 2014-09-10 LAB — URINE CULTURE
Colony Count: NO GROWTH
Culture: NO GROWTH

## 2014-09-11 NOTE — Progress Notes (Signed)
   Subjective:    Patient ID: Kathleen [name], female    DOB: Dec 11, 1922, 81 y.o.   MRN: [id number]  HPI Here with her daughter for follow-up recent emergency department visit for dehydration. She's back at home with the family. Has been doing really well most days. Her granddaughter relates that she has episodes where she's quite happy and reasonable initial go 2 or 3 days in the agitated and refused to eat or drink. She thinks that's what happened this time. They have a part-time aide working with her on a daily basis.   Review of Systems Review of systems is per her granddaughter. As in the history of present illness above, there are days when she does not eat or drink well and is more agitated and then there are days when she's getting along quite well. She does not sleep through the night most nights and occasionally still gets up and wanders. Does not seem to be having any hallucinations and her episodes of agitation usually self resolve or occasionally they'll give her some medication.    Objective:   Physical Exam Vital signs are reviewed GENERAL: Extremely alert appearing octogenarian. No acute distress. NECK: No lymphadenopathy, no thyromegaly, no carotid bruits. LUNGS: Clear to auscultation bilaterally CV: Regular rate and rhythm NEURO/ psych: Alzheimer's. She will occasionally form words. She pays attention to you but does not follow commands, does not understand commands, does not ask her answer questions. Speech is sometimes unintelligible. She is not agitated EXTREMITY: Feet are somewhat cold bilaterally but there is no cyanosis. There is no edema. Significant arthritis with deforming of the CMP, DIP and PIP joints of bilateral hands right greater than left. Joint stiffness is noted in all major joints and in the hands.       Assessment & Plan:

## 2014-09-11 NOTE — Assessment & Plan Note (Addendum)
I discussed with the granddaughter potential ways to keep her hydrated. I think they're overall doing adnexal job at home. She's getting wonderful care. I don't know anything I would do different. They have ordered a special bed rail so they can potentially keep her from wandering at night. Greater than 50% of our 45 minute office visit was spent in counseling and education regarding these issues.

## 2014-09-28 ENCOUNTER — Other Ambulatory Visit: Payer: Self-pay | Admitting: Family Medicine

## 2014-09-28 MED ORDER — AZITHROMYCIN 200 MG/5ML PO SUSR: ORAL | Status: DC

## 2014-10-22 ENCOUNTER — Other Ambulatory Visit: Payer: Self-pay

## 2014-10-22 ENCOUNTER — Encounter (HOSPITAL_COMMUNITY): Payer: Self-pay | Admitting: *Deleted

## 2014-10-22 ENCOUNTER — Other Ambulatory Visit (HOSPITAL_COMMUNITY): Payer: Self-pay

## 2014-10-22 ENCOUNTER — Encounter: Payer: Self-pay | Admitting: Family Medicine

## 2014-10-22 ENCOUNTER — Emergency Department (HOSPITAL_COMMUNITY): Payer: Medicare Other

## 2014-10-22 ENCOUNTER — Inpatient Hospital Stay (HOSPITAL_COMMUNITY)
Admission: EM | Admit: 2014-10-22 | Discharge: 2014-10-24 | DRG: 178 | Disposition: A | Discharge status: Home / Self Care | Payer: Medicare Other | Attending: Family Medicine | Admitting: Family Medicine

## 2014-10-22 DIAGNOSIS — M810 Age-related osteoporosis without current pathological fracture: Secondary | ICD-10-CM | POA: Diagnosis present

## 2014-10-22 DIAGNOSIS — Z6822 Body mass index (BMI) 22.0-22.9, adult: Secondary | ICD-10-CM

## 2014-10-22 DIAGNOSIS — Z87891 Personal history of nicotine dependence: Secondary | ICD-10-CM | POA: Diagnosis not present

## 2014-10-22 DIAGNOSIS — N183 Chronic kidney disease, stage 3 (moderate): Secondary | ICD-10-CM | POA: Diagnosis present

## 2014-10-22 DIAGNOSIS — I350 Nonrheumatic aortic (valve) stenosis: Secondary | ICD-10-CM | POA: Diagnosis present

## 2014-10-22 DIAGNOSIS — Z7901 Long term (current) use of anticoagulants: Secondary | ICD-10-CM

## 2014-10-22 DIAGNOSIS — I480 Paroxysmal atrial fibrillation: Secondary | ICD-10-CM | POA: Diagnosis present

## 2014-10-22 DIAGNOSIS — J9601 Acute respiratory failure with hypoxia: Secondary | ICD-10-CM | POA: Diagnosis not present

## 2014-10-22 DIAGNOSIS — I509 Heart failure, unspecified: Secondary | ICD-10-CM | POA: Diagnosis present

## 2014-10-22 DIAGNOSIS — J690 Pneumonitis due to inhalation of food and vomit: Principal | ICD-10-CM | POA: Diagnosis present

## 2014-10-22 DIAGNOSIS — Z66 Do not resuscitate: Secondary | ICD-10-CM | POA: Diagnosis present

## 2014-10-22 DIAGNOSIS — M1990 Unspecified osteoarthritis, unspecified site: Secondary | ICD-10-CM | POA: Diagnosis present

## 2014-10-22 DIAGNOSIS — R4781 Slurred speech: Secondary | ICD-10-CM | POA: Diagnosis present

## 2014-10-22 DIAGNOSIS — E860 Dehydration: Secondary | ICD-10-CM | POA: Diagnosis present

## 2014-10-22 DIAGNOSIS — Z993 Dependence on wheelchair: Secondary | ICD-10-CM

## 2014-10-22 DIAGNOSIS — D649 Anemia, unspecified: Secondary | ICD-10-CM | POA: Diagnosis present

## 2014-10-22 DIAGNOSIS — M79609 Pain in unspecified limb: Secondary | ICD-10-CM | POA: Diagnosis not present

## 2014-10-22 DIAGNOSIS — Z7982 Long term (current) use of aspirin: Secondary | ICD-10-CM | POA: Diagnosis not present

## 2014-10-22 DIAGNOSIS — R05 Cough: Secondary | ICD-10-CM | POA: Diagnosis present

## 2014-10-22 DIAGNOSIS — I441 Atrioventricular block, second degree: Secondary | ICD-10-CM | POA: Diagnosis present

## 2014-10-22 DIAGNOSIS — I129 Hypertensive chronic kidney disease with stage 1 through stage 4 chronic kidney disease, or unspecified chronic kidney disease: Secondary | ICD-10-CM | POA: Diagnosis present

## 2014-10-22 DIAGNOSIS — R059 Cough, unspecified: Secondary | ICD-10-CM

## 2014-10-22 DIAGNOSIS — M7989 Other specified soft tissue disorders: Secondary | ICD-10-CM | POA: Diagnosis not present

## 2014-10-22 DIAGNOSIS — J189 Pneumonia, unspecified organism: Secondary | ICD-10-CM

## 2014-10-22 DIAGNOSIS — F0390 Unspecified dementia without behavioral disturbance: Secondary | ICD-10-CM | POA: Diagnosis present

## 2014-10-22 DIAGNOSIS — E440 Moderate protein-calorie malnutrition: Secondary | ICD-10-CM | POA: Insufficient documentation

## 2014-10-22 LAB — HEPATIC FUNCTION PANEL
ALBUMIN: 3 g/dL — AB (ref 3.5–5.2)
ALK PHOS: 75 U/L (ref 39–117)
ALT: 17 U/L (ref 0–35)
AST: 31 U/L (ref 0–37)
BILIRUBIN TOTAL: 0.5 mg/dL (ref 0.3–1.2)
Total Protein: 7.9 g/dL (ref 6.0–8.3)

## 2014-10-22 LAB — URINALYSIS, ROUTINE W REFLEX MICROSCOPIC
Bilirubin Urine: NEGATIVE
Glucose, UA: NEGATIVE mg/dL
Hgb urine dipstick: NEGATIVE
KETONES UR: NEGATIVE mg/dL
Nitrite: NEGATIVE
PROTEIN: 30 mg/dL — AB
Specific Gravity, Urine: 1.018 (ref 1.005–1.030)
Urobilinogen, UA: 0.2 mg/dL (ref 0.0–1.0)
pH: 5 (ref 5.0–8.0)

## 2014-10-22 LAB — CBC WITH DIFFERENTIAL/PLATELET
BASOS ABS: 0 10*3/uL (ref 0.0–0.1)
Basophils Relative: 0 % (ref 0–1)
EOS ABS: 0 10*3/uL (ref 0.0–0.7)
EOS PCT: 0 % (ref 0–5)
HCT: 30.6 % — ABNORMAL LOW (ref 36.0–46.0)
Hemoglobin: 10.5 g/dL — ABNORMAL LOW (ref 12.0–15.0)
LYMPHS ABS: 1.4 10*3/uL (ref 0.7–4.0)
LYMPHS PCT: 10 % — AB (ref 12–46)
MCH: 29.2 pg (ref 26.0–34.0)
MCHC: 34.3 g/dL (ref 30.0–36.0)
MCV: 85 fL (ref 78.0–100.0)
Monocytes Absolute: 0.9 10*3/uL (ref 0.1–1.0)
Monocytes Relative: 7 % (ref 3–12)
NEUTROS PCT: 83 % — AB (ref 43–77)
Neutro Abs: 11.3 10*3/uL — ABNORMAL HIGH (ref 1.7–7.7)
Platelets: 223 10*3/uL (ref 150–400)
RBC: 3.6 MIL/uL — AB (ref 3.87–5.11)
RDW: 14.9 % (ref 11.5–15.5)
WBC: 13.6 10*3/uL — AB (ref 4.0–10.5)

## 2014-10-22 LAB — URINE MICROSCOPIC-ADD ON

## 2014-10-22 LAB — POCT I-STAT 3, ART BLOOD GAS (G3+)
Acid-base deficit: 1 mmol/L (ref 0.0–2.0)
Bicarbonate: 23.9 mEq/L (ref 20.0–24.0)
O2 Saturation: 96 %
PCO2 ART: 39.7 mmHg (ref 35.0–45.0)
PH ART: 7.388 (ref 7.350–7.450)
TCO2: 25 mmol/L (ref 0–100)
pO2, Arterial: 80 mmHg (ref 80.0–100.0)

## 2014-10-22 LAB — BASIC METABOLIC PANEL
Anion gap: 15 (ref 5–15)
BUN: 54 mg/dL — AB (ref 6–23)
CHLORIDE: 103 mmol/L (ref 96–112)
CO2: 24 mmol/L (ref 19–32)
CREATININE: 1.98 mg/dL — AB (ref 0.50–1.10)
Calcium: 9.5 mg/dL (ref 8.4–10.5)
GFR calc Af Amer: 25 mL/min — ABNORMAL LOW (ref >90)
GFR calc non Af Amer: 21 mL/min — ABNORMAL LOW (ref >90)
Glucose, Bld: 127 mg/dL — ABNORMAL HIGH (ref 70–99)
Potassium: 5.4 mmol/L — ABNORMAL HIGH (ref 3.5–5.1)
Sodium: 142 mmol/L (ref 135–145)

## 2014-10-22 LAB — CREATININE, SERUM
Creatinine, Ser: 2.13 mg/dL — ABNORMAL HIGH (ref 0.50–1.10)
GFR calc non Af Amer: 19 mL/min — ABNORMAL LOW (ref >90)
GFR, EST AFRICAN AMERICAN: 23 mL/min — AB (ref >90)

## 2014-10-22 LAB — MAGNESIUM: MAGNESIUM: 2 mg/dL (ref 1.5–2.5)

## 2014-10-22 LAB — CBC
HEMATOCRIT: 26.5 % — AB (ref 36.0–46.0)
Hemoglobin: 9.2 g/dL — ABNORMAL LOW (ref 12.0–15.0)
MCH: 29.9 pg (ref 26.0–34.0)
MCHC: 34.7 g/dL (ref 30.0–36.0)
MCV: 86 fL (ref 78.0–100.0)
Platelets: 185 10*3/uL (ref 150–400)
RBC: 3.08 MIL/uL — ABNORMAL LOW (ref 3.87–5.11)
RDW: 15.4 % (ref 11.5–15.5)
WBC: 9.8 10*3/uL (ref 4.0–10.5)

## 2014-10-22 LAB — TROPONIN I: TROPONIN I: 0.03 ng/mL (ref <0.031)

## 2014-10-22 LAB — PROTIME-INR
INR: 1.08 (ref 0.00–1.49)
PROTHROMBIN TIME: 14.2 s (ref 11.6–15.2)

## 2014-10-22 LAB — D-DIMER, QUANTITATIVE (NOT AT ARMC): D DIMER QUANT: 2.37 ug{FEU}/mL — AB (ref 0.00–0.48)

## 2014-10-22 LAB — PHOSPHORUS: PHOSPHORUS: 5.5 mg/dL — AB (ref 2.3–4.6)

## 2014-10-22 LAB — BRAIN NATRIURETIC PEPTIDE: B Natriuretic Peptide: 98.7 pg/mL (ref 0.0–100.0)

## 2014-10-22 MED ORDER — DEXTROSE 5 % IV SOLN
500.0000 mg | Freq: Once | INTRAVENOUS | Status: AC
  Administered 2014-10-22: 500 mg via INTRAVENOUS
  Filled 2014-10-22: qty 500

## 2014-10-22 MED ORDER — HEPARIN SODIUM (PORCINE) 5000 UNIT/ML IJ SOLN
5000.0000 [IU] | Freq: Three times a day (TID) | INTRAMUSCULAR | Status: DC
  Administered 2014-10-22 – 2014-10-24 (×7): 5000 [IU] via SUBCUTANEOUS
  Filled 2014-10-22 (×7): qty 1

## 2014-10-22 MED ORDER — GUAIFENESIN ER 600 MG PO TB12
600.0000 mg | ORAL_TABLET | Freq: Two times a day (BID) | ORAL | Status: DC
  Administered 2014-10-23 – 2014-10-24 (×3): 600 mg via ORAL
  Filled 2014-10-22 (×6): qty 1

## 2014-10-22 MED ORDER — MAGIC MOUTHWASH
10.0000 mL | Freq: Two times a day (BID) | ORAL | Status: DC
  Administered 2014-10-23 (×2): 10 mL via ORAL
  Filled 2014-10-22 (×5): qty 10

## 2014-10-22 MED ORDER — ACETAMINOPHEN 650 MG RE SUPP: 650.0000 mg | Freq: Four times a day (QID) | RECTAL | Status: DC | PRN

## 2014-10-22 MED ORDER — ONDANSETRON HCL 4 MG/2ML IJ SOLN: 4.0000 mg | Freq: Four times a day (QID) | INTRAMUSCULAR | Status: DC | PRN

## 2014-10-22 MED ORDER — AMLODIPINE BESYLATE 5 MG PO TABS
5.0000 mg | ORAL_TABLET | Freq: Every day | ORAL | Status: DC
  Administered 2014-10-23: 5 mg via ORAL
  Filled 2014-10-22 (×2): qty 1

## 2014-10-22 MED ORDER — SODIUM CHLORIDE 0.9 % IV BOLUS (SEPSIS)
500.0000 mL | Freq: Once | INTRAVENOUS | Status: AC
  Administered 2014-10-22: 500 mL via INTRAVENOUS

## 2014-10-22 MED ORDER — PREDNISONE 10 MG PO TABS
10.0000 mg | ORAL_TABLET | Freq: Every day | ORAL | Status: DC
  Administered 2014-10-23 – 2014-10-24 (×2): 10 mg via ORAL
  Filled 2014-10-22 (×5): qty 1

## 2014-10-22 MED ORDER — ACETAMINOPHEN 325 MG PO TABS
650.0000 mg | ORAL_TABLET | Freq: Four times a day (QID) | ORAL | Status: DC | PRN
  Administered 2014-10-23: 650 mg via ORAL
  Filled 2014-10-22: qty 2

## 2014-10-22 MED ORDER — HALOPERIDOL LACTATE 5 MG/ML IJ SOLN
2.0000 mg | Freq: Once | INTRAMUSCULAR | Status: AC
  Administered 2014-10-22: 2 mg via INTRAVENOUS
  Filled 2014-10-22: qty 1

## 2014-10-22 MED ORDER — DEXTROSE 5 % IV SOLN
1.0000 g | Freq: Once | INTRAVENOUS | Status: AC
  Administered 2014-10-22: 1 g via INTRAVENOUS
  Filled 2014-10-22: qty 10

## 2014-10-22 MED ORDER — DEXTROSE 5 % IV SOLN
1.0000 g | INTRAVENOUS | Status: DC
  Administered 2014-10-23 – 2014-10-24 (×2): 1 g via INTRAVENOUS
  Filled 2014-10-22 (×2): qty 10

## 2014-10-22 MED ORDER — HEPARIN SODIUM (PORCINE) 5000 UNIT/ML IJ SOLN: 5000.0000 [IU] | Freq: Three times a day (TID) | INTRAMUSCULAR | Status: DC

## 2014-10-22 MED ORDER — ASPIRIN 81 MG PO CHEW
81.0000 mg | CHEWABLE_TABLET | Freq: Every day | ORAL | Status: DC
  Administered 2014-10-23 – 2014-10-24 (×2): 81 mg via ORAL
  Filled 2014-10-22 (×2): qty 1

## 2014-10-22 MED ORDER — SODIUM CHLORIDE 0.9 % IJ SOLN
3.0000 mL | Freq: Two times a day (BID) | INTRAMUSCULAR | Status: DC
  Administered 2014-10-22 – 2014-10-24 (×4): 3 mL via INTRAVENOUS

## 2014-10-22 MED ORDER — DOCUSATE SODIUM 100 MG PO CAPS
100.0000 mg | ORAL_CAPSULE | Freq: Two times a day (BID) | ORAL | Status: DC
  Administered 2014-10-23 (×2): 100 mg via ORAL
  Filled 2014-10-22 (×5): qty 1

## 2014-10-22 MED ORDER — ONDANSETRON HCL 4 MG PO TABS: 4.0000 mg | ORAL_TABLET | Freq: Four times a day (QID) | ORAL | Status: DC | PRN

## 2014-10-22 MED ORDER — ARTIFICIAL TEARS OP OINT
TOPICAL_OINTMENT | OPHTHALMIC | Status: DC | PRN
  Filled 2014-10-22: qty 3.5

## 2014-10-22 MED ORDER — DEXTROSE 5 % IV SOLN
500.0000 mg | INTRAVENOUS | Status: DC
  Administered 2014-10-23 – 2014-10-24 (×2): 500 mg via INTRAVENOUS
  Filled 2014-10-22 (×2): qty 500

## 2014-10-22 MED ORDER — SODIUM CHLORIDE 0.9 % IV SOLN
INTRAVENOUS | Status: DC
  Administered 2014-10-22 – 2014-10-23 (×5): via INTRAVENOUS

## 2014-10-22 MED ORDER — ENSURE ENLIVE PO LIQD
237.0000 mL | Freq: Two times a day (BID) | ORAL | Status: DC
  Administered 2014-10-23 – 2014-10-24 (×4): 237 mL via ORAL

## 2014-10-22 MED ORDER — BISACODYL 10 MG RE SUPP: 10.0000 mg | Freq: Every day | RECTAL | Status: DC | PRN

## 2014-10-22 MED ORDER — CETYLPYRIDINIUM CHLORIDE 0.05 % MT LIQD
7.0000 mL | Freq: Two times a day (BID) | OROMUCOSAL | Status: DC
  Administered 2014-10-22 – 2014-10-24 (×4): 7 mL via OROMUCOSAL

## 2014-10-22 MED ORDER — OLOPATADINE HCL 0.1 % OP SOLN
1.0000 [drp] | Freq: Two times a day (BID) | OPHTHALMIC | Status: DC
  Administered 2014-10-22 – 2014-10-24 (×5): 1 [drp] via OPHTHALMIC
  Filled 2014-10-22 (×2): qty 5

## 2014-10-22 NOTE — H&P (Signed)
Family Medicine Teaching [facility] Admission History and Physical Service Pager: [phone]  Patient name: Kathleen [name] Medical record number: [id number] Date of birth: March 20, 1923 Age: 81 y.o. Gender: female  Primary Care Provider: Denny Levy, MD Consultants: None Code Status: DNR (discussed with daughter)  Chief Complaint: cough, RLE swelling  Assessment and Plan: Kathleen [name] is a 81 y.o. female presenting with cough, increased lung secretions, right lower extremity swelling and tenderness . PMH is significant for a mission in January with respiratory failure, A. fib, ?CHF, CKD, severe dementia, hypertension and protein calorie malnutrition.  Cough with LE swelling and tenderness: Patient's family states that she's had some mild right lower extremity swelling and some tenderness for the past couple of days. She has been afebrile, but also has increased lung secretions, with history of respiratory failure and pneumonia in the past. Patient is nonambulatory. Takes aspirin daily. - Chest x-ray with right lower lobe atelectasis. Uncertain if pneumonia is present at this time, possibly not shown on x-ray due to dehydration. - Azithromycin plus ceftriaxone started in the emergency room, will continue at this time. Could consider repeat chest x-ray after fluid rehydration. If Patient declines could consider increase to HCAP, although it has been greater than 90 days since her last admission (January).  - Aspiration unlikely, but she is potentially at risk with her dementia. Family states that she can eat solids without choking. - Bilateral lower extremity Dopplers: Preliminary report is negative. - D-dimer is elevated at 2.3; trop negative. Well's score ~5 > Moderate risk. ? anticoag candidate with hematoma formation when anticoagulated for AF. Vitals are stable, no tachycardia, no fever. No right heart strain on EKG. - Discussed with attending, will obtain an ABG, if a gradient  elevated will consider VQ scan. Likely unable to obtain a CTA secondary to creatinine of 1.98 today.  Slurred speech: Family feels that she potentially had some slurred speech starting Monday evening. Patient is nonverbal today and unable to assess. She does attempt to get out of bed in the evenings, and had a fall a few days ago. Troponin negative. Glucose normal. - CT head negative for acute processes today. - Unknown etiology of slurred speech or still present, cannot rule out CVA. Could consider MRI/MRA. Patient is not likely a candidate for anticoagulation secondary to hematoma formation when anticoagulated for her atrial fibrillation. She takes aspirin daily.  Hypertension: Patient has a history of hypertension, has been controlled since admission. Only medication is Norvasc 5 mg, possible distant history of occasional Lasix use. - Continue Norvasc, hold if hypotensive.  Protein calorie malnutrition: Patient with decreased appetite, muscle wasting and weight loss. Albumin 3 on admission. Patient drinks ensure 1-2 times a day at home. - Ensure twice a day - Nutrition consult  Dementia: Discussed with daughter, at baseline she is minimally verbal, doesn't always know her name or answer questions appropriately. Needs full assistance with all feeds. Nonambulatory. FAST stage 7B/D.  - Ordered bedside sitter, patient is a fall risk with multiple attempts to get out of bed at night. - Up with assistance only - Assistance with all feeds, caution questionable choking risk, although family states that she is able to eat solids without difficulties. - Encouraged the family member to be near to all times. - Patient has Haldol 2 mg on her home meds, but apparently is not currently taking it for agitation. May consider adding this back on her list, if agitation occurs, did not order it currently. - Patient is  known to become dehydrated easily, more recent decline in decreased appetite.  CKD: Creatinine  on admission 1.98. This is approximately her baseline with ranges between 1.61-2.29 over the last year. - Avoid nephrotoxic agents when he will - Monitor BMP  Atrial fib: also history of Wenckebach 2nd degree AV block. Patient currently takes aspirin 81 mg daily, unable to tolerate anticoagulation secondary to hematoma formation in the past and fall risk. - Continue aspirin, will place on telemetry  H/o CHF: only BNP in system 2 months ago 110.3. Echo 08/10/2014 ejection fraction 65%, LVH. No wall abnormalities. Grade 2 diastolic dysfunction. Mild aortic stenosis. Mildly dilated left atrium. Chest x-ray: Stable cardiomegaly, right lung base atelectasis, less likely pneumonia. Does not appear fluid overloaded by chest x-ray or exam, appears dry. - by chart it appears she may have been on Lasix when necessary in the past or fluid weight gain. Not on current med list and family states she is currently not taking this medication.  FEN/GI: regular diet, ensure and 125cc/hr NACL for 12 hours, then reassess.  Prophylaxis: hep SQ  Disposition: pending clinic improvement and family discussion on GOC  History of Present Illness: Kathleen [name] is a 81 y.o. female presenting with 24 hour onset of cough, increased mucus production and right lower extremity swelling and tenderness. Patient has a history of severe dementia, protein calorie malnutrition, recent ICU admission for respiratory failure in January, A. fib, second-degree AV block, CKD and CHF. Patient presented to the emergency room late last night for 1 day history of cough with increased secretions, that was causing her to gag. History was obtained by family, as patient has severe dementia and is minimally verbal. Family states she has also been weak for 2-3 days, with right lower extremity swelling and pain. They feel that she possibly has some slurred speech on Tuesday. She has had decreased oral intake over the last few months. Family denies  fever, chills, nausea, vomiting or diarrhea.  CT of head in the emergency room was negative for any acute processes. Patient did have a oxygen desaturation to 83% on room air, by report from ED physician. However this was not documented in the chart. Patient was placed on 2 L nasal cannula. She does not have an oxygen requirement normally at home.  Review Of Systems: Per HPI with the following additions: Per history of present illness Otherwise 12 point review of systems was performed and was unremarkable.  Patient Active Problem List   Diagnosis Date Noted  . Cough 10/22/2014  . Hypothermia 08/10/2014  . Severe dementia 08/10/2014  . Dysphagia, pharyngoesophageal phase 08/10/2014  . Protein-calorie malnutrition, severe 08/10/2014  . Acute respiratory failure with hypercapnia   . Respiratory failure 08/09/2014  . Essential hypertension, benign 06/29/2014  . Dementia without behavioral disturbance 06/29/2014  . Acute encephalopathy 09/17/2013  . Unintentional weight loss 06/13/2013  . Agitation 06/12/2013  . Difficulty walking 03/06/2013  . 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 Anemia 02/24/2012  . Generalized OA 12/16/2011  . Paroxysmal atrial fibrillation 10/13/2010  . Kidney disease, chronic, stage III (GFR 30-59 ml/min) 10/13/2010  . Osteoporosis 10/13/2010  . CHF (congestive heart failure) 10/13/2010   Past Medical History: Past Medical History  Diagnosis Date  . Hypertension   . Arthritis   . Spinal stenosis   . Osteoarthritis   . Anemia   . CHF (congestive heart failure)   . Heart murmur     aortic regurg  . Osteoporosis   .  Chronic kidney disease   . Generalized OA 12/16/2011    Significant. Seems to have encompass all joints. Patient is complaining of significant pain but dementia allows patient to be happy.  . Dementia   . SunDown syndrome   . Atrial fibrillation   . 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 Paroxysmal atrial fibrillation  10/13/2010    Rate controlled Only on aspirin due to hx of bicep hematoma 09/2010) and significant fall risk   . CHF (congestive heart failure)    Past Surgical History: Past Surgical History  Procedure Laterality Date  . Nephrectomy Right   . Tonsillectomy     Social History: History  Substance Use Topics  . Smoking status: Former Games developer  . Smokeless tobacco: Never Used     Comment: quit smoking in the 70's  . Alcohol Use: No   Additional social history: Lives with family, [employer]ie [employer] support.  Please also refer to relevant sections of EMR.  Family History: Family History  Problem Relation Age of Onset  . Hypertension Daughter    Allergies and Medications: No Known Allergies No current facility-administered medications on file prior to encounter.   Current Outpatient Prescriptions on File Prior to Encounter  Medication Sig Dispense Refill  . Alum & Mag Hydroxide-Simeth (MAGIC MOUTHWASH) SOLN Take 10 mLs by mouth 4 (four) times daily. (Patient taking differently: Take 10 mLs by mouth 2 (two) times daily. ) 500 mL 3  . amLODipine (NORVASC) 5 MG tablet Take 5 mg by mouth daily.    Marland Kitchen artificial tears (LACRILUBE) OINT ophthalmic ointment Place into both eyes every 4 (four) hours as needed for dry eyes.  0  . aspirin 81 MG tablet Take 1 tablet (81 mg total) by mouth daily. 30 tablet 3  . Melatonin 3 MG TABS Take 3 mg by mouth at bedtime as needed (sleep).     Marland Kitchen PATADAY 0.2 % SOLN APPLY 1 DROP TO EYE 2 (TWO) TIMES DAILY. 2.5 mL 1  . albuterol (PROVENTIL) (2.5 MG/3ML) 0.083% nebulizer solution Take 3 mLs (2.5 mg total) by nebulization every 6 (six) hours as needed for wheezing or shortness of breath. (Patient not taking: Reported on 10/22/2014) 75 mL 12  . azithromycin (ZITHROMAX) 200 MG/5ML suspension Take by mouth 12.5 ml day one and 6.25 days 2 through 4 (Patient not taking: Reported on 10/22/2014) 37 mL 0  . bisacodyl (DULCOLAX) 10 MG suppository Place 1 suppository (10 mg total) rectally  daily as needed for mild constipation. (Patient not taking: Reported on 09/09/2014) 12 suppository 0  . haloperidol (HALDOL) 2 MG/ML solution Take 0.5 mLs (1 mg total) by mouth every 6 (six) hours as needed for agitation. (Patient not taking: Reported on 09/09/2014) 100 mL 0    Objective: BP 129/62 mmHg  Pulse 84  Temp(Src) 98.8 F (37.1 C) (Rectal)  Resp 23  SpO2 100% There is no weight on file to calculate BMI.  Exam: Gen: NAD. Demented, Minimally verbal. 2LNC. Thin, elderly, AAF HEENT: AT. [place]. Bilateral eyes without injections or icterus. Dry mucous membranes. Dentures.  Neck: No LAD, muscle wasting.  CV: RRR, distant heart sounds, difficult to appreciate over rhonchi.  Chest: Severe rhonchi anterior bilateral chest, crackles appreciated RML, RLL. No wheezing. Normal effort.  Abd: Soft. flat. NTND. BS present (hypoactive). No Masses palpated.  Ext: Hyperpigmentation right shin area. Mild RLE swelling at popliteal/medial knee. TTP right calf and knee. No tenderness or swelling of left LE. No palpable cords. Unable to complete homans secondary  to dementia. Upper extremity mild contractures. Skin: No rashes, purpura or petechiae.  Abrasion left anterior patella. Neuro: nonambulatory. Not alert on exam. Able to shake her head to answer some questions. ? If appropriate. Severe dementia.    Labs and Imaging: CBC BMET   Recent Labs Lab 10/22/14 0509  WBC 13.6*  HGB 10.5*  HCT 30.6*  PLT 223    Recent Labs Lab 10/22/14 0509  NA 142  K 5.4*  CL 103  CO2 24  BUN 54*  CREATININE 1.98*  GLUCOSE 127*  CALCIUM 9.5     Urinalysis    Component Value Date/Time   COLORURINE YELLOW 10/22/2014 0450   APPEARANCEUR CLOUDY* 10/22/2014 0450   LABSPEC 1.018 10/22/2014 0450   PHURINE 5.0 10/22/2014 0450   GLUCOSEU NEGATIVE 10/22/2014 0450   HGBUR NEGATIVE 10/22/2014 0450   HGBUR neg 02/18/2013   BILIRUBINUR NEGATIVE 10/22/2014 0450   BILIRUBINUR NEG 04/23/2014 1353   KETONESUR  NEGATIVE 10/22/2014 0450   PROTEINUR 30* 10/22/2014 0450   PROTEINUR 30 04/23/2014 1353   UROBILINOGEN 0.2 10/22/2014 0450   UROBILINOGEN 0.2 04/23/2014 1353   NITRITE NEGATIVE 10/22/2014 0450   NITRITE NEG 04/23/2014 1353   LEUKOCYTESUR SMALL* 10/22/2014 0450   LEUKOCYTESUR Trace* 02/18/2013   tropi: 0.03 Ucx: pending D-Dimer: 2.37 EKG: Sinus or ectopic atrial rhythm, Prolonged PR interval, Abnormal R-wave progression, early transition Left ventricular hypertrophy, Nonspecific T abnormalities, lateral leads  10/22/2014  CXR:  IMPRESSION: Stable cardiomegaly. RIGHT lung base atelectasis, less likely early pneumonia.   Electronically Signed   By: Awilda Metro   On: 10/22/2014 04:53    10/22/2014 CT head w/o contrast:  FINDINGS: Moderate to severe ventriculomegaly, with parallel consideration of lateral horns and occipital horns, agenesis of the corpus callosum. No intraparenchymal hemorrhage, mass effect, midline shift. No acute large vascular territory infarct. Mild white matter changes suggest chronic small vessel ischemic disease.  No abnormal extra-axial fluid collections. Basal cisterns are patent. Moderate calcific atherosclerosis of the carotid siphons.  No skull fracture. Widened atlantodental interval, unchanged with severe osteoarthrosis partially imaged. The included ocular globes and orbital contents are non-suspicious. Chronic RIGHT sphenoid sinusitis. Small LEFT posterior ethmoid mucosal retention cyst. The patient is edentulous.  IMPRESSION: No acute intracranial process.  Agenesis of the corpus callosum. Involutional changes and mild white matter changes suggest chronic small vessel ischemic disease.  Chronically widened atlantodental interval partially imaged with severe osteoarthrosis.   Electronically Signed   By: Awilda Metro   On: 10/22/2014 06:46    Natalia Leatherwood, DO 10/22/2014, 9:36 AM PGY-3, [facility] FPTS Intern pager: [phone], text pages  welcome

## 2014-10-22 NOTE — Progress Notes (Signed)
Patient came to the floor around 1300, lethargic, patient have 2 mg of hadol in ed, SR on the monitor in 70-80 , BP manually 76/40. MD notified, MD order 500 ml ns bolus, and start NS at 125 ml/hr. BP come up to 98/47, 100%/2L of oxygen. Family at bedside. Will continue to monitor patient.

## 2014-10-22 NOTE — ED Notes (Signed)
See downtime paperwork.

## 2014-10-22 NOTE — ED Notes (Signed)
Called staffing for safety sitter, staffing put pt on list and stated they would work on finding a Recruitment consultant.

## 2014-10-22 NOTE — ED Notes (Signed)
Phlebotomy at bedside.

## 2014-10-22 NOTE — ED Notes (Addendum)
Requested pt medication from pharmacy x2

## 2014-10-22 NOTE — ED Notes (Signed)
Patient transported to CT 

## 2014-10-22 NOTE — Progress Notes (Signed)
RT performed nasal suctioning on patient. Copious amounts of yellow/tan thick secretions were retrieved. RT will continue to monitor.

## 2014-10-22 NOTE — ED Provider Notes (Addendum)
CSN: [id number]     Arrival date & time 10/22/14  0145 History   First MD Initiated Contact with Patient 10/22/14 0429     No chief complaint on file.    (Consider location/radiation/quality/duration/timing/severity/associated sxs/prior Treatment) HPI Comments: Level 5 caveat - dementia Kathleen [name] is a 81 y.o. Female brought in by ambulance, with hx CHF and atrial fibrillation who presents to the Emergency Department complaining of shortness of breath that began earlier tonight. Family states that pt was gagging on phlegm earlier in the day. She also has chest congestion, cough, and wheezing. They called EMS because they were worried about her having difficulty breathing. Family states that 2 days ago, pt began mumbling and having difficulty speaking. She has been diagnosed with periods of sun down and is having increased sun down times. Family denies hx of stroke, changes in medication, or facial droop. Family also mentions that pt has been having frequent falls as of late. The most recent fall was 34 days ago. Pt is usually wheelchair bound. She is on daily aspirin. Pt is on 88% on RA.Marland Kitchen Denies fevers or any other symptoms  The history is provided by the patient.    Past Medical History  Diagnosis Date  . Hypertension   . Arthritis   . Spinal stenosis   . Osteoarthritis   . Anemia   . CHF (congestive heart failure)   . Heart murmur     aortic regurg  . Osteoporosis   . Chronic kidney disease   . Generalized OA 12/16/2011    Significant. Seems to have encompass all joints. Patient is complaining of significant pain but dementia allows patient to be happy.  . Dementia   . SunDown syndrome   . Atrial fibrillation   . 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 Paroxysmal atrial fibrillation 10/13/2010    Rate controlled Only on aspirin due to hx of bicep hematoma 09/2010) and significant fall risk   . CHF (congestive heart failure)    Past Surgical History  Procedure  Laterality Date  . Nephrectomy Right   . Tonsillectomy     Family History  Problem Relation Age of Onset  . Hypertension Daughter    History  Substance Use Topics  . Smoking status: Former Games developer  . Smokeless tobacco: Never Used     Comment: quit smoking in the 70's  . Alcohol Use: No   OB History    No data available     Review of Systems  Unable to perform ROS: Dementia  All other systems reviewed and are negative.     Allergies  Review of patient's allergies indicates no known allergies.  Home Medications   Prior to Admission medications   Medication Sig Start Date End Date Taking? Authorizing Provider  albuterol (PROVENTIL) (2.5 MG/3ML) 0.083% nebulizer solution Take 3 mLs (2.5 mg total) by nebulization every 6 (six) hours as needed for wheezing or shortness of breath. 08/11/14   Nestor Ramp, MD  Alum & Mag Hydroxide-Simeth (MAGIC MOUTHWASH) SOLN Take 10 mLs by mouth 4 (four) times daily. Patient not taking: Reported on 09/09/2014 08/10/14   Edsel Petrin, DO  amLODipine (NORVASC) 5 MG tablet Take 5 mg by mouth daily.    Historical Provider, MD  artificial tears (LACRILUBE) OINT ophthalmic ointment Place into both eyes every 4 (four) hours as needed for dry eyes. 08/10/14   Edsel Petrin, DO  aspirin 81 MG tablet Take 1 tablet (81 mg total) by mouth daily.  06/01/13   Lonia Skinner, MD  azithromycin (ZITHROMAX) 200 MG/5ML suspension Take by mouth 12.5 ml day one and 6.25 days 2 through 4 09/28/14   Nestor Ramp, MD  bisacodyl (DULCOLAX) 10 MG suppository Place 1 suppository (10 mg total) rectally daily as needed for mild constipation. Patient not taking: Reported on 09/09/2014 08/10/14   Edsel Petrin, DO  haloperidol (HALDOL) 2 MG/ML solution Take 0.5 mLs (1 mg total) by mouth every 6 (six) hours as needed for agitation. Patient not taking: Reported on 09/09/2014 08/10/14   Edsel Petrin, DO  Melatonin 3 MG TABS Take by mouth.    Historical Provider, MD   PATADAY 0.2 % SOLN APPLY 1 DROP TO EYE 2 (TWO) TIMES DAILY. 02/28/14   Nestor Ramp, MD   BP 92/48 mmHg  Pulse 80  Temp(Src) 98.8 F (37.1 C) (Rectal)  Resp 17  SpO2 95% Physical Exam  Constitutional: She is oriented to person, place, and time. She appears well-developed and well-nourished.  HENT:  Head: Normocephalic and atraumatic.  Eyes: EOM are normal. Pupils are equal, round, and reactive to light.  Neck: Neck supple.  Cardiovascular: Normal rate, regular rhythm and normal heart sounds.   No murmur heard. Pulmonary/Chest: Effort normal. No respiratory distress.  Abdominal: Soft. She exhibits no distension. There is no tenderness. There is no rebound and no guarding.  Musculoskeletal: She exhibits no tenderness.  LLE is slightly swollen compared to the contralateral side  Neurological: She is alert and oriented to person, place, and time.  Skin: Skin is warm and dry.  Nursing note and vitals reviewed.   ED Course  Procedures (including critical care time) Labs Review Labs Reviewed  CBC WITH DIFFERENTIAL/PLATELET - Abnormal; Notable for the following:    WBC 13.6 (*)    RBC 3.60 (*)    Hemoglobin 10.5 (*)    HCT 30.6 (*)    Neutrophils Relative % 83 (*)    Neutro Abs 11.3 (*)    Lymphocytes Relative 10 (*)    All other components within normal limits  BASIC METABOLIC PANEL - Abnormal; Notable for the following:    Potassium 5.4 (*)    Glucose, Bld 127 (*)    BUN 54 (*)    Creatinine, Ser 1.98 (*)    GFR calc non Af Amer 21 (*)    GFR calc Af Amer 25 (*)    All other components within normal limits  URINALYSIS, ROUTINE W REFLEX MICROSCOPIC - Abnormal; Notable for the following:    APPearance CLOUDY (*)    Protein, ur 30 (*)    Leukocytes, UA SMALL (*)    All other components within normal limits  D-DIMER, QUANTITATIVE - Abnormal; Notable for the following:    D-Dimer, Quant 2.37 (*)    All other components within normal limits  URINE MICROSCOPIC-ADD ON -  Abnormal; Notable for the following:    Squamous Epithelial / LPF MANY (*)    Bacteria, UA MANY (*)    Casts HYALINE CASTS (*)    All other components within normal limits  URINE CULTURE  TROPONIN I    Imaging Review Dg Chest 2 View  10/22/2014   CLINICAL DATA:  Shortness of breath, cough and hypoxia. Nonverbal. History of dementia, CHF, hypertension.  EXAM: CHEST  2 VIEW  COMPARISON:  Chest radiograph October 10, 2014  FINDINGS: The cardiac silhouette is at least moderately enlarged, unchanged from prior imaging. Tortuous, possibly ectatic aorta. RIGHT lung base patchy  airspace opacity without pleural effusion. No pneumothorax. Osteopenia. Limited lateral radiograph as patient's arms are not elevated.  IMPRESSION: Stable cardiomegaly. RIGHT lung base atelectasis, less likely early pneumonia.   Electronically Signed   By: Awilda Metro   On: 10/22/2014 04:53   Ct Head Wo Contrast  10/22/2014   CLINICAL DATA:  Shortness of breath, chest congestion, difficulty speaking for 2 days. Multiple recent falls. History of dementia, currently nonverbal.  EXAM: CT HEAD WITHOUT CONTRAST  TECHNIQUE: Contiguous axial images were obtained from the base of the skull through the vertex without intravenous contrast.  COMPARISON:  CT of the head September 09, 2014  FINDINGS: Moderate to severe ventriculomegaly, with parallel consideration of lateral horns and occipital horns, agenesis of the corpus callosum. No intraparenchymal hemorrhage, mass effect, midline shift. No acute large vascular territory infarct. Mild white matter changes suggest chronic small vessel ischemic disease.  No abnormal extra-axial fluid collections. Basal cisterns are patent. Moderate calcific atherosclerosis of the carotid siphons.  No skull fracture. Widened atlantodental interval, unchanged with severe osteoarthrosis partially imaged. The included ocular globes and orbital contents are non-suspicious. Chronic RIGHT sphenoid sinusitis. Small LEFT  posterior ethmoid mucosal retention cyst. The patient is edentulous.  IMPRESSION: No acute intracranial process.  Agenesis of the corpus callosum. Involutional changes and mild white matter changes suggest chronic small vessel ischemic disease.  Chronically widened atlantodental interval partially imaged with severe osteoarthrosis.   Electronically Signed   By: Awilda Metro   On: 10/22/2014 06:46     EKG Interpretation None     ED ECG REPORT   Date: 10/22/2014  Rate: 94  Rhythm: normal sinus rhythm  QRS Axis: left  Intervals: normal  ST/T Wave abnormalities: nonspecific ST/T changes  Conduction Disutrbances:none  Narrative Interpretation:   Old EKG Reviewed: unchanged  I have personally reviewed the EKG tracing and agree with the computerized printout as noted.  MDM   Final diagnoses:  CAP (community acquired pneumonia)  Acute respiratory failure with hypoxia    Pt comes in with cc of DIB. She has CHF hx. Exam and hx is limited by dementia. Pt is however noted to be hypoxic on room air.  We will get cardiac workup started. With increased delerium, will make sure there is no uti or pneumonia. With reports of slurred speech, will make sure there is no sub acute stroke or brain bleed and get CT head. Will have to admit for hypoxia.   7:37 AM Pt's dimer is elevated. US DVT ordered.  Derwood Kaplan, MD 10/22/14 0530  Derwood Kaplan, MD 10/22/14 1610  Derwood Kaplan, MD 10/22/14 [phone]

## 2014-10-22 NOTE — ED Notes (Signed)
Pt off unit with CT doppler

## 2014-10-22 NOTE — ED Notes (Signed)
Family practice MD stated to hold all oral medications due to pt being non verbal and limited awareness, also asked to hold all blood draws at this time.

## 2014-10-22 NOTE — Progress Notes (Signed)
VASCULAR LAB PRELIMINARY  PRELIMINARY  PRELIMINARY  PRELIMINARY  Bilateral lower extremity venous duplex completed.    Preliminary report:  Bilateral:  No evidence of DVT, superficial thrombosis, or Baker's Cyst. No change since study of 10/28/2013   Kathleen [name], RVS 10/22/2014, 9:32 AM

## 2014-10-23 ENCOUNTER — Inpatient Hospital Stay (HOSPITAL_COMMUNITY): Payer: Medicare Other

## 2014-10-23 DIAGNOSIS — E440 Moderate protein-calorie malnutrition: Secondary | ICD-10-CM | POA: Insufficient documentation

## 2014-10-23 LAB — COMPREHENSIVE METABOLIC PANEL
ALT: 13 U/L (ref 0–35)
ANION GAP: 12 (ref 5–15)
AST: 17 U/L (ref 0–37)
Albumin: 2.5 g/dL — ABNORMAL LOW (ref 3.5–5.2)
Alkaline Phosphatase: 64 U/L (ref 39–117)
BUN: 51 mg/dL — ABNORMAL HIGH (ref 6–23)
CALCIUM: 8.3 mg/dL — AB (ref 8.4–10.5)
CO2: 20 mmol/L (ref 19–32)
Chloride: 112 mmol/L (ref 96–112)
Creatinine, Ser: 1.85 mg/dL — ABNORMAL HIGH (ref 0.50–1.10)
GFR calc non Af Amer: 23 mL/min — ABNORMAL LOW (ref >90)
GFR, EST AFRICAN AMERICAN: 27 mL/min — AB (ref >90)
Glucose, Bld: 91 mg/dL (ref 70–99)
Potassium: 4.4 mmol/L (ref 3.5–5.1)
SODIUM: 144 mmol/L (ref 135–145)
Total Bilirubin: 0.5 mg/dL (ref 0.3–1.2)
Total Protein: 6.7 g/dL (ref 6.0–8.3)

## 2014-10-23 LAB — URINALYSIS, ROUTINE W REFLEX MICROSCOPIC
Bilirubin Urine: NEGATIVE
Glucose, UA: NEGATIVE mg/dL
Hgb urine dipstick: NEGATIVE
Ketones, ur: NEGATIVE mg/dL
LEUKOCYTES UA: NEGATIVE
NITRITE: NEGATIVE
PROTEIN: NEGATIVE mg/dL
Specific Gravity, Urine: 1.016 (ref 1.005–1.030)
UROBILINOGEN UA: 0.2 mg/dL (ref 0.0–1.0)
pH: 5 (ref 5.0–8.0)

## 2014-10-23 LAB — CBC WITH DIFFERENTIAL/PLATELET
Basophils Absolute: 0 10*3/uL (ref 0.0–0.1)
Basophils Relative: 0 % (ref 0–1)
EOS PCT: 0 % (ref 0–5)
Eosinophils Absolute: 0 10*3/uL (ref 0.0–0.7)
HCT: 24.5 % — ABNORMAL LOW (ref 36.0–46.0)
HEMOGLOBIN: 8.7 g/dL — AB (ref 12.0–15.0)
LYMPHS ABS: 1.1 10*3/uL (ref 0.7–4.0)
Lymphocytes Relative: 13 % (ref 12–46)
MCH: 30 pg (ref 26.0–34.0)
MCHC: 35.5 g/dL (ref 30.0–36.0)
MCV: 84.5 fL (ref 78.0–100.0)
MONO ABS: 1.2 10*3/uL — AB (ref 0.1–1.0)
Monocytes Relative: 14 % — ABNORMAL HIGH (ref 3–12)
Neutro Abs: 6.1 10*3/uL (ref 1.7–7.7)
Neutrophils Relative %: 73 % (ref 43–77)
Platelets: 172 10*3/uL (ref 150–400)
RBC: 2.9 MIL/uL — ABNORMAL LOW (ref 3.87–5.11)
RDW: 15 % (ref 11.5–15.5)
WBC: 8.5 10*3/uL (ref 4.0–10.5)

## 2014-10-23 LAB — URINE CULTURE
CULTURE: NO GROWTH
Colony Count: NO GROWTH

## 2014-10-23 MED ORDER — AMLODIPINE BESYLATE 10 MG PO TABS
10.0000 mg | ORAL_TABLET | Freq: Every day | ORAL | Status: DC
  Administered 2014-10-24: 10 mg via ORAL
  Filled 2014-10-23: qty 1

## 2014-10-23 MED ORDER — AMLODIPINE BESYLATE 5 MG PO TABS
5.0000 mg | ORAL_TABLET | Freq: Once | ORAL | Status: AC
Start: 2014-10-23 — End: 2014-10-23
  Administered 2014-10-23: 5 mg via ORAL
  Filled 2014-10-23: qty 1

## 2014-10-23 NOTE — Progress Notes (Signed)
Pt unable to void since 7pm, bladder scan showed 433cc, Dr. Kennon Rounds notified and ordered in & out cath, order carried out and got 450cc out.

## 2014-10-23 NOTE — Care Management Note (Unsigned)
    Page 1 of 1   10/23/2014     11:51:08 AM CARE MANAGEMENT NOTE 10/23/2014  Patient:  Kathleen [name], Kathleen [name]   Account Number:  [id number]  Date Initiated:  10/23/2014  Documentation initiated by:  Tyniah Kastens  Subjective/Objective Assessment:   Pt adm on 10/22/14 with cough, dehydration, ?PNA.  PTA, pt resided at home with daughter.     Action/Plan:   Will follow for dc needs as pt progresses.   Anticipated DC Date:  10/26/2014   Anticipated DC Plan:  SKILLED NURSING FACILITY  In-house referral  Clinical Social Worker      DC Planning Services  CM consult      Choice offered to / List presented to:             Status of service:  In process, will continue to follow Medicare Important Message given?   (If response is "NO", the following Medicare IM given date fields will be blank) Date Medicare IM given:   Medicare IM given by:   Date Additional Medicare IM given:   Additional Medicare IM given by:    Discharge Disposition:    Per UR Regulation:  Reviewed for med. necessity/level of care/duration of stay  If discussed at Long Length of Stay Meetings, dates discussed:    Comments:

## 2014-10-23 NOTE — Progress Notes (Signed)
Paged [facility] concerning elevation of patient BP. New orders will be placed.

## 2014-10-23 NOTE — Progress Notes (Signed)
Family Medicine Teaching Service Daily Progress Note Intern Pager: [phone]  Patient name: Kathleen [name] Medical record number: [id number] Date of birth: [birth year] Age: 81 y.o. Gender: female  Primary Care Provider: Denny Levy, MD Consultants: None  Code Status: DNR  Assessment and Plan: 81 y.o. female presenting with cough, increased lung secretions, right lower extremity swelling and tenderness . PMH is significant for a mission in January with respiratory failure, A. fib, ?CHF, CKD, severe dementia, hypertension and protein calorie malnutrition.  # Cough with LE swelling and tenderness:  mild right lower extremity swelling and some tenderness for the past couple of days. She has been afebrile, but also has increased lung secretions, with history of respiratory failure and pneumonia in the past. Patient is nonambulatory. Takes aspirin daily. - WBC 13.6 > 8.5 - D-dimer is elevated at 2.3 - Troponin negative. Well's score ~5 > Moderate risk. ? anticoag candidate with hematoma formation when anticoagulated for AF. - ABG normal - CXR- right lower lobe atelectasis. Uncertain if pneumonia is present at this time, possibly not shown on x-ray due to dehydration.  --Repeat CXR today - Bilateral lower extremity Dopplers: negative. - Azithromycin, Ceftriaxone (4/13>>)  # Slurred speech:  Starting - CT head negative for acute processes - Consider MRI/MRA.  - Patient is not likely a candidate for anticoagulation secondary to hematoma formation when anticoagulated for her atrial fibrillation. She takes aspirin daily.  # Hypertension:  - BP 147/84 this morning - Norvasc 5 mg  # Protein calorie malnutrition: Patient with decreased appetite, muscle wasting and weight loss. Albumin 3 on admission. Patient drinks ensure 1-2 times a day at home. - Ensure twice a day - Nutrition consult  # Dementia: at baseline she is minimally verbal, doesn't always know her name or answer questions  appropriately. Needs full assistance with all feeds. Nonambulatory. FAST stage 7B/D.  - Ordered bedside sitter, patient is a fall risk with multiple attempts to get out of bed at night. - Up with assistance only - Assistance with all feeds  # CKD: Creatinine on admission 1.98. Baseline 1.61-2.29 - Creatinine improving to 1.85 today - Avoid nephrotoxic agents - Monitor BMP  # Atrial fib: also history of Wenckebach 2nd degree AV block. Patient currently takes aspirin 81 mg daily, unable to tolerate anticoagulation secondary to hematoma formation in the past and fall risk. - Continue aspirin, will place on telemetry  # H/o CHF: Echo 08/10/2014 ejection fraction 65%, LVH. No wall abnormalities. Grade 2 diastolic dysfunction. Mild aortic stenosis. Mildly dilated left atrium.  - BNP 98.7 - Chest x-ray: Stable cardiomegaly, right lung base atelectasis, less likely pneumonia.   FEN/GI: regular diet, ensure and 125cc/hr NACL for 12 hours, then reassess.  Prophylaxis: hep SQ  Disposition: pending clinic improvement and family discussion on GOC  Subjective:  Per family, Kathleen [name] is doing much better today. Eating well. Still not talking. Asleep and could not arouse, which per family occurs often.  Objective: Temp:  [98 F (36.7 C)-98.7 F (37.1 C)] 98.2 F (36.8 C) (04/14 0809) Pulse Rate:  [54-94] 88 (04/14 0809) Resp:  [18-23] 20 (04/14 0809) BP: (76-173)/(40-148) 147/84 mmHg (04/14 0809) SpO2:  [96 %-100 %] 100 % (04/14 0809) Weight:  [110 lb 7.2 oz (50.1 kg)] 110 lb 7.2 oz (50.1 kg) (04/13 1045) Physical Exam: General: 81yo female in no apparent distress, asleep throughout exam Cardiovascular: S1 and S2 noted, upper airway sounds made thorough exam difficult, regular rate and rhythm Respiratory: Upper airway sounds, no  increased work of breathing Abdomen: Soft and non-distended, bowel sounds noted, non-tender to palpation Extremities: No edema  Laboratory:  Recent  Labs Lab 10/22/14 0509 10/22/14 1358 10/23/14 0529  WBC 13.6* 9.8 8.5  HGB 10.5* 9.2* 8.7*  HCT 30.6* 26.5* 24.5*  PLT 223 185 172    Recent Labs Lab 10/22/14 0509 10/22/14 1358 10/23/14 0529  NA 142  --  144  K 5.4*  --  4.4  CL 103  --  112  CO2 24  --  20  BUN 54*  --  51*  CREATININE 1.98* 2.13* 1.85*  CALCIUM 9.5  --  8.3*  PROT 7.9  --  6.7  BILITOT 0.5  --  0.5  ALKPHOS 75  --  64  ALT 17  --  13  AST 31  --  17  GLUCOSE 127*  --  91   ABG    Component Value Date/Time   PHART 7.388 10/22/2014 0956   PCO2ART 39.7 10/22/2014 0956   PO2ART 80.0 10/22/2014 0956   HCO3 23.9 10/22/2014 0956   TCO2 25 10/22/2014 0956   ACIDBASEDEF 1.0 10/22/2014 0956   O2SAT 96.0 10/22/2014 0956   Urinalysis    Component Value Date/Time   COLORURINE YELLOW 10/22/2014 0450   APPEARANCEUR CLOUDY* 10/22/2014 0450   LABSPEC 1.018 10/22/2014 0450   PHURINE 5.0 10/22/2014 0450   GLUCOSEU NEGATIVE 10/22/2014 0450   HGBUR NEGATIVE 10/22/2014 0450   HGBUR neg 02/18/2013   BILIRUBINUR NEGATIVE 10/22/2014 0450   BILIRUBINUR NEG 04/23/2014 1353   KETONESUR NEGATIVE 10/22/2014 0450   PROTEINUR 30* 10/22/2014 0450   PROTEINUR 30 04/23/2014 1353   UROBILINOGEN 0.2 10/22/2014 0450   UROBILINOGEN 0.2 04/23/2014 1353   NITRITE NEGATIVE 10/22/2014 0450   NITRITE NEG 04/23/2014 1353   LEUKOCYTESUR SMALL* 10/22/2014 0450   LEUKOCYTESUR Trace* 02/18/2013  - BNP 98.7 - Troponin negative - D Dimer 2.37  Imaging/Diagnostic Tests: Dg Chest 2 View  10/22/2014   CLINICAL DATA:  Shortness of breath, cough and hypoxia. Nonverbal. History of dementia, CHF, hypertension.  EXAM: CHEST  2 VIEW  COMPARISON:  Chest radiograph October 10, 2014  FINDINGS: The cardiac silhouette is at least moderately enlarged, unchanged from prior imaging. Tortuous, possibly ectatic aorta. RIGHT lung base patchy airspace opacity without pleural effusion. No pneumothorax. Osteopenia. Limited lateral radiograph as patient's  arms are not elevated.  IMPRESSION: Stable cardiomegaly. RIGHT lung base atelectasis, less likely early pneumonia.   Electronically Signed   By: Awilda Metro   On: 10/22/2014 04:53   Ct Head Wo Contrast  10/22/2014   CLINICAL DATA:  Shortness of breath, chest congestion, difficulty speaking for 2 days. Multiple recent falls. History of dementia, currently nonverbal.  EXAM: CT HEAD WITHOUT CONTRAST  TECHNIQUE: Contiguous axial images were obtained from the base of the skull through the vertex without intravenous contrast.  COMPARISON:  CT of the head September 09, 2014  FINDINGS: Moderate to severe ventriculomegaly, with parallel consideration of lateral horns and occipital horns, agenesis of the corpus callosum. No intraparenchymal hemorrhage, mass effect, midline shift. No acute large vascular territory infarct. Mild white matter changes suggest chronic small vessel ischemic disease.  No abnormal extra-axial fluid collections. Basal cisterns are patent. Moderate calcific atherosclerosis of the carotid siphons.  No skull fracture. Widened atlantodental interval, unchanged with severe osteoarthrosis partially imaged. The included ocular globes and orbital contents are non-suspicious. Chronic RIGHT sphenoid sinusitis. Small LEFT posterior ethmoid mucosal retention cyst. The patient is edentulous.  IMPRESSION:  No acute intracranial process.  Agenesis of the corpus callosum. Involutional changes and mild white matter changes suggest chronic small vessel ischemic disease.  Chronically widened atlantodental interval partially imaged with severe osteoarthrosis.   Electronically Signed   By: Awilda Metro   On: 10/22/2014 06:46   Araceli Bouche, DO 10/23/2014, 8:20 AM PGY-1, [facility] Medicine FPTS Intern pager: [phone], text pages welcome

## 2014-10-23 NOTE — Progress Notes (Signed)
INITIAL NUTRITION ASSESSMENT  DOCUMENTATION CODES Per approved criteria  -Non-severe (moderate) malnutrition in the context of chronic illness  Pt meets criteria for MODERATE MALNUTRITION in the context of CHRONIC ILLNESS as evidenced by 8% weight loss in less than 2 months and moderate muscle wasting.  INTERVENTION: Continue Ensure Enlive po BID, each supplement provides 350 kcal and 20 grams of protein Provide Magic Cup ice cream BID, each supplement provides 290 kcal and 9 grams of protein   NUTRITION DIAGNOSIS: Inadequate oral intake related to dementia, lethargy, poor appetite as evidenced by 8% weight loss in less than 2 months.   Goal: Pt to meet >/= 90% of their estimated nutrition needs   Monitor:  PO intake, weight trend, labs, I/O's, GOC  Reason for Assessment: Consult for Poor PO intake  81 y.o. female  Admitting Dx: <principal problem not specified>  ASSESSMENT: 81 y.o. female presenting with cough, increased lung secretions, right lower extremity swelling and tenderness . PMH is significant for a admission in January with respiratory failure, A. fib, ?CHF, CKD, severe dementia, hypertension and protein calorie malnutrition.  Pt care in progress at time of first visit. Pt asleep at time of second visit with sitter at bedside. Sitter reports pt ate a few bites for lunch and drank half and Ensure supplement; pt drinks Ensure at home per family.  Per nursing notes pt has been lethargic; ate 75% of breakfast this morning. Pt with dementia and needs full assistance with feeding. Weight history below shows that pt has lost 8% of her body weight in the past 6 weeks.   Labs: low calcium, low GFR, low hemoglobin, elevated phosphorus  Nutrition Focused Physical Exam:  Subcutaneous Fat:  Orbital Region: wnl Upper Arm Region: NA Thoracic and Lumbar Region: NA  Muscle:  Temple Region: moderate wasting Clavicle Bone Region: moderate wasting Clavicle and Acromion Bone  Region: mild wasting Scapular Bone Region: NA Dorsal Hand: NA Patellar Region: wnl Anterior Thigh Region: mild wasting Posterior Calf Region: NA  Edema: none   Height: Ht Readings from Last 1 Encounters:  10/22/14 4' (1.219 m)  4'10" per chart history  Weight: Wt Readings from Last 1 Encounters:  10/22/14 110 lb 7.2 oz (50.1 kg)    Ideal Body Weight: 97 lbs  % Ideal Body Weight: 113%  Wt Readings from Last 10 Encounters:  10/22/14 110 lb 7.2 oz (50.1 kg)  09/09/14 120 lb (54.432 kg)  08/10/14 124 lb 5.4 oz (56.4 kg)  08/07/14 120 lb 6.4 oz (54.613 kg)  07/28/14 120 lb 6.4 oz (54.613 kg)  07/03/14 120 lb (54.432 kg)  06/24/14 122 lb (55.339 kg)  11/24/13 117 lb 1 oz (53.1 kg)  11/18/13 117 lb 9.6 oz (53.343 kg)  10/25/13 118 lb 12.8 oz (53.887 kg)    Usual Body Weight: 120 lbs  % Usual Body Weight: 92%  BMI:  Body mass index is 22.98 kg/(m^2).  Estimated Nutritional Needs: Kcal: 1200-1400 Protein: 60-70 grams Fluid: 1.4 L/day  Skin: intact  Diet Order: Diet regular Room service appropriate?: Yes with Assist; Fluid consistency:: Thin  EDUCATION NEEDS: -No education needs identified at this time   Intake/Output Summary (Last 24 hours) at 10/23/14 1101 Last data filed at 10/23/14 0949  Gross per 24 hour  Intake 2424.17 ml  Output    450 ml  Net 1974.17 ml    Last BM: 4/14  Labs:   Recent Labs Lab 10/22/14 0509 10/22/14 1358 10/23/14 0529  NA 142  --  144  K 5.4*  --  4.4  CL 103  --  112  CO2 24  --  20  BUN 54*  --  51*  CREATININE 1.98* 2.13* 1.85*  CALCIUM 9.5  --  8.3*  MG 2.0  --   --   PHOS 5.5*  --   --   GLUCOSE 127*  --  91    CBG (last 3)  No results for input(s): GLUCAP in the last 72 hours.  Scheduled Meds: . amLODipine  5 mg Oral Daily  . antiseptic oral rinse  7 mL Mouth Rinse BID  . aspirin  81 mg Oral Daily  . azithromycin  500 mg Intravenous Q24H  . cefTRIAXone (ROCEPHIN)  IV  1 g Intravenous Q24H  . docusate  sodium  100 mg Oral BID  . feeding supplement (ENSURE ENLIVE)  237 mL Oral BID BM  . guaiFENesin  600 mg Oral BID  . heparin  5,000 Units Subcutaneous 3 times per day  . magic mouthwash  10 mL Oral BID  . olopatadine  1 drop Both Eyes BID  . predniSONE  10 mg Oral Q breakfast  . sodium chloride  3 mL Intravenous Q12H    Continuous Infusions: . sodium chloride 50 mL/hr at 10/23/14 3532    Past Medical History  Diagnosis Date  . Hypertension   . Arthritis   . Spinal stenosis   . Osteoarthritis   . Anemia   . CHF (congestive heart failure)   . Heart murmur     aortic regurg  . Osteoporosis   . Chronic kidney disease   . Generalized OA 12/16/2011    Significant. Seems to have encompass all joints. Patient is complaining of significant pain but dementia allows patient to be happy.  . Dementia   . SunDown syndrome   . Atrial fibrillation   . 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 Paroxysmal atrial fibrillation 10/13/2010    Rate controlled Only on aspirin due to hx of bicep hematoma 09/2010) and significant fall risk   . CHF (congestive heart failure)     Past Surgical History  Procedure Laterality Date  . Nephrectomy Right   . Tonsillectomy      Ian Malkin RD, LDN Inpatient Clinical Dietitian Pager: [phone] After Hours Pager: [phone]

## 2014-10-23 NOTE — Progress Notes (Signed)
Appears more responsive than this am, but still not opening eyes. Cardiac and respiratory exam unchanged. UA normal. CXR with consolidation in right lower lobe. Continue antibiotics. Continue to monitor. Sitter at bedside. Consider Haldol 0.5mg  if sundowning occurs.  Dr. Caroleen Hamman

## 2014-10-24 DIAGNOSIS — J690 Pneumonitis due to inhalation of food and vomit: Secondary | ICD-10-CM

## 2014-10-24 LAB — CBC
HCT: 25.2 % — ABNORMAL LOW (ref 36.0–46.0)
Hemoglobin: 8.8 g/dL — ABNORMAL LOW (ref 12.0–15.0)
MCH: 29.8 pg (ref 26.0–34.0)
MCHC: 34.9 g/dL (ref 30.0–36.0)
MCV: 85.4 fL (ref 78.0–100.0)
PLATELETS: 185 10*3/uL (ref 150–400)
RBC: 2.95 MIL/uL — AB (ref 3.87–5.11)
RDW: 15.3 % (ref 11.5–15.5)
WBC: 8.5 10*3/uL (ref 4.0–10.5)

## 2014-10-24 LAB — BASIC METABOLIC PANEL
ANION GAP: 11 (ref 5–15)
BUN: 41 mg/dL — ABNORMAL HIGH (ref 6–23)
CHLORIDE: 110 mmol/L (ref 96–112)
CO2: 20 mmol/L (ref 19–32)
Calcium: 8.4 mg/dL (ref 8.4–10.5)
Creatinine, Ser: 1.77 mg/dL — ABNORMAL HIGH (ref 0.50–1.10)
GFR calc non Af Amer: 24 mL/min — ABNORMAL LOW (ref >90)
GFR, EST AFRICAN AMERICAN: 28 mL/min — AB (ref >90)
Glucose, Bld: 117 mg/dL — ABNORMAL HIGH (ref 70–99)
POTASSIUM: 4.1 mmol/L (ref 3.5–5.1)
SODIUM: 141 mmol/L (ref 135–145)

## 2014-10-24 LAB — HEMOGLOBIN A1C
HEMOGLOBIN A1C: 5.7 % — AB (ref 4.8–5.6)
Mean Plasma Glucose: 117 mg/dL

## 2014-10-24 MED ORDER — DEXTROSE-NACL 5-0.9 % IV SOLN
INTRAVENOUS | Status: DC
  Filled 2014-10-24 (×2): qty 1000

## 2014-10-24 MED ORDER — MAGIC MOUTHWASH: 5.0000 mL | Freq: Four times a day (QID) | ORAL | Status: AC | PRN

## 2014-10-24 MED ORDER — AMLODIPINE BESYLATE 10 MG PO TABS: 10.0000 mg | ORAL_TABLET | Freq: Every day | ORAL | Status: DC

## 2014-10-24 MED ORDER — PIPERACILLIN-TAZOBACTAM IN DEX 2-0.25 GM/50ML IV SOLN
2.2500 g | Freq: Three times a day (TID) | INTRAVENOUS | Status: DC
  Filled 2014-10-24 (×2): qty 50

## 2014-10-24 NOTE — Progress Notes (Signed)
Family Medicine Teaching Service Daily Progress Note Intern Pager: [phone]  Patient name: Kathleen [name] Medical record number: [id number] Date of birth: April 18, 1923 Age: 81 y.o. Gender: female  Primary Care Provider: Denny Levy, MD Consultants: None  Code Status: DNR  Assessment and Plan: 81 y.o. female presenting with cough, increased lung secretions, right lower extremity swelling and tenderness . PMH is significant for a mission in January with respiratory failure, A. fib, ?CHF, CKD, severe dementia, hypertension and protein calorie malnutrition.  # Aspiration Pneumonia:  mild right lower extremity swelling and some tenderness for the past couple of days. She has been afebrile, but also has increased lung secretions, with history of respiratory failure and pneumonia in the past. Patient is nonambulatory. Takes aspirin daily. - WBC 13.6 > 8.5 - D-dimer is elevated at 2.3 - Troponin negative. Well's score ~5 > Moderate risk. ? anticoag candidate with hematoma formation when anticoagulated for AF. - ABG normal - CXR- right lower lobe atelectasis. Uncertain if pneumonia is present at this time, possibly not shown on x-ray due to dehydration. - Repeat CXR: atelectasis vs. Consolidation right lower lobe - Bilateral lower extremity Dopplers: negative. - Azithromycin, Ceftriaxone (4/13>>)--discontinue antibiotics - Speech Evaluation: high risk aspiration, impaired swallow function - Will transition to dysphagia 3 diet  # Hypertension:  - BP 146/76 this morning - Norvasc 5 mg--Increased to 10mg  4/14  # Protein calorie malnutrition: Patient with decreased appetite, muscle wasting and weight loss. Albumin 3 on admission. Patient drinks ensure 1-2 times a day at home. - Ensure twice a day - Nutrition consult  # Dementia: at baseline she is minimally verbal, doesn't always know her name or answer questions appropriately. Needs full assistance with all feeds. Nonambulatory. FAST stage 7B/D.   - Ordered bedside sitter, patient is a fall risk with multiple attempts to get out of bed at night. - Up with assistance only - Assistance with all feeds - Consider Haldol 0.5mg  at night if sundowning occurs  # CKD: Creatinine on admission 1.98. 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 Patient currently takes aspirin 81 mg daily, unable to tolerate anticoagulation secondary to hematoma formation in the past and fall risk. - Continue aspirin, will place on telemetry  FEN/GI: NPO, NS @ 19mL/hr Prophylaxis: hep SQ  Disposition: Discharge today  Subjective:  Feeling better today. Not yet at baseline, however progressing.  Objective: Temp:  [98 F (36.7 C)-98.6 F (37 C)] 98.1 F (36.7 C) (04/15 0826) Pulse Rate:  [45-104] 87 (04/15 0826) Resp:  [18-20] 20 (04/15 0826) BP: (144-187)/(67-91) 146/76 mmHg (04/15 0826) SpO2:  [86 %-100 %] 100 % (04/15 0826) Weight:  [110 lb 3.5 oz (49.994 kg)] 110 lb 3.5 oz (49.994 kg) (04/15 0556) Physical Exam: General: 81yo female in no apparent distress, awake and conversing Cardiovascular: S1 and S2 noted, upper airway sounds made thorough exam difficult, regular rate and rhythm Respiratory: Upper airway sounds, no increased work of breathing Abdomen: Soft and non-distended, bowel sounds noted, non-tender to palpation Extremities: No edema  Laboratory:  Recent Labs Lab 10/22/14 0509 10/22/14 1358 10/23/14 0529  WBC 13.6* 9.8 8.5  HGB 10.5* 9.2* 8.7*  HCT 30.6* 26.5* 24.5*  PLT 223 185 172    Recent Labs Lab 10/22/14 0509 10/22/14 1358 10/23/14 0529  NA 142  --  144  K 5.4*  --  4.4  CL 103  --  112  CO2 24  --  20  BUN 54*  --  51*  CREATININE 1.98* 2.13* 1.85*  CALCIUM 9.5  --  8.3*  PROT 7.9  --  6.7  BILITOT 0.5  --  0.5  ALKPHOS 75  --  64  ALT 17  --  13  AST 31  --  17  GLUCOSE 127*  --  91    ABG    Component Value Date/Time   PHART 7.388 10/22/2014 0956   PCO2ART 39.7 10/22/2014 0956   PO2ART 80.0 10/22/2014 0956   HCO3 23.9 10/22/2014 0956   TCO2 25 10/22/2014 0956   ACIDBASEDEF 1.0 10/22/2014 0956   O2SAT 96.0 10/22/2014 0956   Urinalysis    Component Value Date/Time   COLORURINE YELLOW 10/23/2014 1859   APPEARANCEUR CLEAR 10/23/2014 1859   LABSPEC 1.016 10/23/2014 1859   PHURINE 5.0 10/23/2014 1859   GLUCOSEU NEGATIVE 10/23/2014 1859   HGBUR NEGATIVE 10/23/2014 1859   HGBUR neg 02/18/2013   BILIRUBINUR NEGATIVE 10/23/2014 1859   BILIRUBINUR NEG 04/23/2014 1353   KETONESUR NEGATIVE 10/23/2014 1859   PROTEINUR NEGATIVE 10/23/2014 1859   PROTEINUR 30 04/23/2014 1353   UROBILINOGEN 0.2 10/23/2014 1859   UROBILINOGEN 0.2 04/23/2014 1353   NITRITE NEGATIVE 10/23/2014 1859   NITRITE NEG 04/23/2014 1353   LEUKOCYTESUR NEGATIVE 10/23/2014 1859   LEUKOCYTESUR Trace* 02/18/2013  - BNP 98.7 - Troponin negative - D Dimer 2.37  Imaging/Diagnostic Tests: Dg Chest 2 View  10/23/2014   CLINICAL DATA:  Cough, congestion, shortness of breath  EXAM: CHEST  2 VIEW  COMPARISON:  10/22/2014  FINDINGS: Enlargement of cardiac silhouette.  Atherosclerotic calcification and mild tortuosity of thoracic aorta.  Mediastinal contours and pulmonary vascularity otherwise normal.  RIGHT hilum appears more prominent than on the previous exam though patient is rotated in the opposite direction.  Persistent atelectasis versus infiltrate at RIGHT base.  Remaining lungs clear.  No pleural effusion or pneumothorax.  Bones demineralized.  BILATERAL glenohumeral degenerative changes.  IMPRESSION: Enlargement of cardiac silhouette.  Persistent atelectasis versus consolidation RIGHT lower lobe.   Electronically Signed   By: Ulyses Southward M.D.   On: 10/23/2014 13:48   Dg Chest 2 View  10/22/2014   CLINICAL DATA:  Shortness of breath, cough and hypoxia. Nonverbal. History of dementia, CHF, hypertension.   EXAM: CHEST  2 VIEW  COMPARISON:  Chest radiograph October 10, 2014  FINDINGS: The cardiac silhouette is at least moderately enlarged, unchanged from prior imaging. Tortuous, possibly ectatic aorta. RIGHT lung base patchy airspace opacity without pleural effusion. No pneumothorax. Osteopenia. Limited lateral radiograph as patient's arms are not elevated.  IMPRESSION: Stable cardiomegaly. RIGHT lung base atelectasis, less likely early pneumonia.   Electronically Signed   By: Awilda Metro   On: 10/22/2014 04:53   Ct Head Wo Contrast  10/22/2014   CLINICAL DATA:  Shortness of breath, chest congestion, difficulty speaking for 2 days. Multiple recent falls. History of dementia, currently nonverbal.  EXAM: CT HEAD WITHOUT CONTRAST  TECHNIQUE: Contiguous axial images were obtained from the base of the skull through the vertex without intravenous contrast.  COMPARISON:  CT of the head September 09, 2014  FINDINGS: Moderate to severe ventriculomegaly, with parallel consideration of lateral horns and occipital horns, agenesis of the corpus callosum. No intraparenchymal hemorrhage, mass effect, midline shift. No acute large vascular territory infarct. Mild white matter changes suggest chronic small vessel ischemic disease.  No abnormal extra-axial fluid collections. Basal cisterns are patent. Moderate  calcific atherosclerosis of the carotid siphons.  No skull fracture. Widened atlantodental interval, unchanged with severe osteoarthrosis partially imaged. The included ocular globes and orbital contents are non-suspicious. Chronic RIGHT sphenoid sinusitis. Small LEFT posterior ethmoid mucosal retention cyst. The patient is edentulous.  IMPRESSION: No acute intracranial process.  Agenesis of the corpus callosum. Involutional changes and mild white matter changes suggest chronic small vessel ischemic disease.  Chronically widened atlantodental interval partially imaged with severe osteoarthrosis.   Electronically Signed   By:  Awilda Metro   On: 10/22/2014 06:46   Araceli Bouche, DO 10/24/2014, 8:32 AM PGY-1, [facility] Family Medicine FPTS Intern pager: [phone], text pages welcome

## 2014-10-24 NOTE — Evaluation (Addendum)
Clinical/Bedside Swallow Evaluation Patient Details  Name: SAARA KIJOWSKI MRN: [id number] Date of Birth: [birth year]  Today's Date: 10/24/2014 Time: SLP Start Time (ACUTE ONLY): 0900 SLP Stop Time (ACUTE ONLY): 0920 SLP Time Calculation (min) (ACUTE ONLY): 20 min  Past Medical History:  Past Medical History  Diagnosis Date  . Hypertension   . Arthritis   . Spinal stenosis   . Osteoarthritis   . Anemia   . CHF (congestive heart failure)   . Heart murmur     aortic regurg  . Osteoporosis   . Chronic kidney disease   . Generalized OA 12/16/2011    Significant. Seems to have encompass all joints. Patient is complaining of significant pain but dementia allows patient to be happy.  . Dementia   . SunDown syndrome   . Atrial fibrillation   . 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 Paroxysmal atrial fibrillation 10/13/2010    Rate controlled Only on aspirin due to hx of bicep hematoma 09/2010) and significant fall risk   . CHF (congestive heart failure)    Past Surgical History:  Past Surgical History  Procedure Laterality Date  . Nephrectomy Right   . Tonsillectomy     HPI:  81 y.o. female presenting with cough, increased lung secretions, right lower extremity swelling and tenderness . PMH is significant for admission in January with respiratory failure, A. fib, ?CHF, CKD, severe dementia, hypertension and protein calorie malnutrition. CXR shows Persistent atelectasis versus consolidation RIGHT lower lobe. RN and family report prolonged mastication and oral holding with solids recently, but prior to illness pt eating regular solids well.    Assessment / Plan / Recommendation Clinical Impression  Pt demonstrates high risk of aspiration and evidence of impaired swallow function. Pt is awake, but lethargic with increased RR and WOB. RN also reports need for frequent NT suctioning indicating poor management of secretions. Sips of water and bites of puree result in decreased oral  control and delayed signs of aspiration. Pt is recommended to be NPO except for meds crushed in puree. Pt may benefit from objective swallow test when respiratory function and arousal are improved. Pts granddaughter and RN are aware.     Aspiration Risk  Severe    Diet Recommendation NPO;NPO except meds   Medication Administration: Crushed with puree    Other  Recommendations Recommended Consults: MBS Oral Care Recommendations: Oral care Q4 per protocol Other Recommendations: Have oral suction available   Follow Up Recommendations  24 hour supervision/assistance;Skilled Nursing facility    Frequency and Duration min 3x week  2 weeks   Pertinent Vitals/Pain NA    SLP Swallow Goals     Swallow Study Prior Functional Status       General HPI: 81 y.o. female presenting with cough, increased lung secretions, right lower extremity swelling and tenderness . PMH is significant for admission in January with respiratory failure, A. fib, ?CHF, CKD, severe dementia, hypertension and protein calorie malnutrition. CXR shows Persistent atelectasis versus consolidation RIGHT lower lobe. RN and family report prolonged mastication and oral holding with solids.  Type of Study: Bedside swallow evaluation Diet Prior to this Study: Regular;Thin liquids Temperature Spikes Noted: No Respiratory Status: Nasal cannula History of Recent Intubation: No Behavior/Cognition: Lethargic;Requires cueing Oral Cavity - Dentition: Dentures, top;Dentures, bottom Self-Feeding Abilities: Total assist Patient Positioning: Upright in bed Baseline Vocal Quality: Low vocal intensity Volitional Cough: Cognitively unable to elicit Volitional Swallow: Unable to elicit    Oral/Motor/Sensory Function Overall Oral Motor/Sensory Function: Appears  within functional limits for tasks assessed   Ice Chips     Thin Liquid Thin Liquid: Impaired Presentation: Cup;Straw Oral Phase Impairments: Reduced labial seal Oral Phase  Functional Implications: Right anterior spillage;Left anterior spillage Pharyngeal  Phase Impairments: Suspected delayed Swallow;Decreased hyoid-laryngeal movement;Cough - Immediate;Cough - Delayed    Nectar Thick Nectar Thick Liquid: Not tested   Honey Thick Honey Thick Liquid: Not tested   Puree Puree: Impaired Presentation: Spoon Oral Phase Impairments: Impaired anterior to posterior transit Oral Phase Functional Implications: Prolonged oral transit;Oral holding Pharyngeal Phase Impairments: Suspected delayed Swallow;Cough - Delayed;Cough - Immediate   Solid   GO    Solid: Not tested      Harlon Ditty, MA CCC-SLP [phone]  Claudine Mouton 10/24/2014,9:36 AM

## 2014-10-24 NOTE — Progress Notes (Signed)
ANTIBIOTIC CONSULT NOTE - INITIAL  Pharmacy Consult for zosyn Indication: aspiration PNA  No Known Allergies  Patient Measurements: Height: 4' (121.9 cm) Weight: 110 lb 3.5 oz (49.994 kg) IBW/kg (Calculated) : 17.9   Vital Signs: Temp: 98.1 F (36.7 C) (04/15 0826) Temp Source: Axillary (04/15 0826) BP: 146/76 mmHg (04/15 0826) Pulse Rate: 87 (04/15 0826) Intake/Output from previous day: 04/14 0701 - 04/15 0700 In: 2017.5 [P.O.:540; I.V.:1227.5; IV Piggyback:250] Out: 200 [Urine:200] Intake/Output from this shift: Total I/O In: 50 [IV Piggyback:50] Out: -   Labs:  Recent Labs  10/22/14 1358 10/23/14 0529 10/24/14 0810  WBC 9.8 8.5 8.5  HGB 9.2* 8.7* 8.8*  PLT 185 172 185  CREATININE 2.13* 1.85* 1.77*   Estimated Creatinine Clearance: 10.4 mL/min (by C-G formula based on Cr of 1.77). No results for input(s): VANCOTROUGH, VANCOPEAK, VANCORANDOM, GENTTROUGH, GENTPEAK, GENTRANDOM, TOBRATROUGH, TOBRAPEAK, TOBRARND, AMIKACINPEAK, AMIKACINTROU, AMIKACIN in the last 72 hours.   Microbiology: Recent Results (from the past 720 hour(s))  Urine culture     Status: None   Collection Time: 10/22/14  4:50 AM  Result Value Ref Range Status   Specimen Description URINE, CATHETERIZED  Final   Special Requests Immunocompromised  Final   Colony Count NO GROWTH Performed at [facility]   Final   Culture NO GROWTH Performed at [facility]   Final   Report Status 10/23/2014 FINAL  Final    Medical History: Past Medical History  Diagnosis Date  . Hypertension   . Arthritis   . Spinal stenosis   . Osteoarthritis   . Anemia   . CHF (congestive heart failure)   . Heart murmur     aortic regurg  . Osteoporosis   . Chronic kidney disease   . Generalized OA 12/16/2011    Significant. Seems to have encompass all joints. Patient is complaining of significant pain but dementia allows patient to be happy.  . Dementia   . SunDown syndrome   . Atrial  fibrillation   . 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 Paroxysmal atrial fibrillation 10/13/2010    Rate controlled Only on aspirin due to hx of bicep hematoma 09/2010) and significant fall risk   . CHF (congestive heart failure)     Assessment: 81 yo F with aspiration PNA.  Pharmacy consulted to dose zosyn for aspiration PNA. CXR: consolidation RLL.  WBC 13.6>>8.5, creat 1.98>>1.77, Wt 50 kg. Calculated creat cl ~ 17 ml/min.  azith 4/13>>4/15 Ceftriaxone 4/13>>4/15 Zosyn 4/15>>  Goal of Therapy:  eradicate infection  Plan:  Zosyn 2.25 gm IV q8 Pharmacy will sign off  Herby Abraham, Pharm.D. [phone] 10/24/2014 10:33 AM

## 2014-10-24 NOTE — Progress Notes (Signed)
Occupational Therapy Treatment Patient Details Name: Kathleen [name] MRN: [id number] DOB: 11-27-1922 Today's Date: 10/24/2014    History of present illness Admitted with cough, decreased mentation, increased secretions and bil LE edema with PMHx of dementia, CHF, Afib, HTN   OT comments  This 81 yo female seen this session to educate grand-daughter on use of palm protectors and wash cloth rolls in pt's hands. Did not get to address whole UE positioning due to she had to leave to pick someone up.  Follow Up Recommendations  No OT follow up    Equipment Recommendations  Hospital bed;Wheelchair cushion (measurements OT) (hoyer lift)       Precautions / Restrictions Precautions Precautions: Fall Restrictions Weight Bearing Restrictions: No              ADL Overall ADL's : Needs assistance/impaired                                       General ADL Comments: In to speak to grand-daughter about how to use palm protectors (at night and to monitor web space for rubbing/causing pressure areas) and using washcloth rolls during the day. Putting baking soda on both for odor control. Al of this to help prevent contratures of hands                Cognition   Behavior During Therapy: Flat affect Overall Cognitive Status: Impaired/Different from baseline Area of Impairment: Attention   Current Attention Level: Sustained Memory: Decreased short-term memory          General Comments: pt unable to follow commands for mobility consistently with eyes closed in supine and at rest in chair                 Pertinent Vitals/ Pain       Pain Assessment:  (PAIN AD=0)  Home Living Family/patient expects to be discharged to:: Private residence Living Arrangements: Children Available Help at Discharge: Family;Personal care attendant;Available 24 hours/day Type of Home: House Home Access: Ramped entrance     Home Layout: One level               Home  Equipment: Wheelchair - [employer] - 2 wheels;Bedside commode   Additional Comments: Family desires return home with total care      Prior Functioning/Environment Level of Independence: Needs assistance  Gait / Transfers Assistance Needed: uses WC, assist to transfer to [facility] varies from min to total depending on pt mentation that day ADL's / Homemaking Assistance Needed: total A with bathing and dressing, grooming and feeding by family and caregiver. Pivots to [facility] holding WC at times when she is more alert (per grand-daughter; other times they basically have to lift her)       Frequency Min 2X/week     Progress Toward Goals  OT Goals(current goals can now be found in the care plan section)  Progress towards OT goals: Progressing toward goals  Acute Rehab OT Goals Patient Stated Goal: return home OT Goal Formulation: With family Time For Goal Achievement: 10/31/14 Potential to Achieve Goals: Good ADL Goals Additional ADL Goal #1: Family will be independent with soft splinting of Bil hands and postioning of  UEs  Plan      Co-evaluation    PT/OT/SLP Co-Evaluation/Treatment: Yes Reason for Co-Treatment: Necessary to address cognition/behavior during functional activity;For patient/therapist safety PT goals addressed during session: Balance;Mobility/safety with mobility  OT goals addressed during session: Strengthening/ROM;ADL's and self-care         Activity Tolerance Patient tolerated treatment well   Patient Left in chair   Nurse Communication  (palm protectors at night and wash cloth rolls during the day)        Time: 1318-1330 OT Time Calculation (min): 12 min  Charges: OT General Charges $OT Visit: 1 Procedure OT Evaluation $Initial OT Evaluation Tier I: 1 Procedure OT Treatments $Prosthetics Training: 8-22 mins  Evette Georges [phone] 10/24/2014, 3:07 PM

## 2014-10-24 NOTE — Progress Notes (Signed)
Patient discharged home. Discharge orders reviewed with daughter and grand daughter. Verbalizes understanding.

## 2014-10-24 NOTE — Evaluation (Signed)
Physical Therapy Evaluation Patient Details Name: Kathleen [name] MRN: [id number] DOB: [birth year] Today's Date: 10/24/2014   History of Present Illness  Admitted with cough, decreased mentation, increased secretions and bil LE edema with PMHx of dementia, CHF, Afib, HTN  Clinical Impression  Pt lethargic on arrival with increased alertness once transferred to EOB. Pt maintains bil UE flexion posturing with  Clenched hands and lack of trunk control in sitting. Pt with varied function from min to total assist depending on her cognition each day as it varies widely per family. Family very clear about desire to return home with aide and continue total care for pt. Granddaughter educated for equipment and transfer recommendation. Pt with decreased participation and activity tolerance today and will benefit from therapy trial acutely to maximize pt function and family education for safety with mobility and transfers for caregiver safety.     Follow Up Recommendations No PT follow up    Equipment Recommendations  Wheelchair cushion (measurements PT);Other (comment);Hospital bed (hoyer lift. Pt currently with WC without cushion)    Recommendations for Other Services       Precautions / Restrictions Precautions Precautions: Fall      Mobility  Bed Mobility Overal bed mobility: Needs Assistance Bed Mobility: Supine to Sit     Supine to sit: Total assist;+2 for physical assistance     General bed mobility comments: pivot with pad with HOB 30 degrees and assist of pad to scoot to EOB  Transfers Overall transfer level: Needs assistance   Transfers: Squat Pivot Transfers     Squat pivot transfers: Total assist;+2 physical assistance     General transfer comment: max cues and assist with bil knees blocked, use of belt and pad to elevate and pivot sacrum to chair. Granddgtr educated for transfer technique as well as single person squat pivot with bobath technique with verbalized  understanding  Ambulation/Gait                Stairs            Wheelchair Mobility    Modified Rankin (Stroke Patients Only)       Balance Overall balance assessment: Needs assistance   Sitting balance-Leahy Scale: Zero Sitting balance - Comments: initially posterior right lean with transition to full anterior lean despite cues with max assist for balance     Standing balance-Leahy Scale: Zero                               Pertinent Vitals/Pain Pain Assessment:  (PAIN AD= 0)    Home Living Family/patient expects to be discharged to:: Private residence Living Arrangements: Children Available Help at Discharge: Family;Personal care attendant;Available 24 hours/day Type of Home: House Home Access: Ramped entrance     Home Layout: One level Home Equipment: Wheelchair - [employer] - 2 wheels;Bedside commode Additional Comments: Family desires return home with total care    Prior Function Level of Independence: Needs assistance   Gait / Transfers Assistance Needed: uses WC, assist to transfer to [facility] varies from min to total depending on pt mentation that day  ADL's / Homemaking Assistance Needed: A with bathing and dressing, grooming and feeding by family and caregiver. Pivots to [facility] holding WC at times         Hand Dominance        Extremity/Trunk Assessment   Upper Extremity Assessment: Defer to OT evaluation  Lower Extremity Assessment: Generalized weakness;RLE deficits/detail;LLE deficits/detail RLE Deficits / Details: knee extension 2-/5 LLE Deficits / Details: knee extension 2-/5  Cervical / Trunk Assessment: Kyphotic  Communication      Cognition Arousal/Alertness: Lethargic Behavior During Therapy: Flat affect Overall Cognitive Status: Impaired/Different from baseline Area of Impairment: Attention   Current Attention Level: Sustained Memory: Decreased short-term memory         General Comments: pt  unable to follow commands for mobility consistently with eyes closed in supine and at rest in chair    General Comments      Exercises General Exercises - Lower Extremity Short Arc QuadBarbaraann Boys;Both;5 reps;Seated      Assessment/Plan    PT Assessment Patient needs continued PT services  PT Diagnosis Generalized weakness;Altered mental status   PT Problem List Decreased strength;Decreased cognition;Decreased activity tolerance;Decreased balance;Decreased mobility  PT Treatment Interventions Functional mobility training;Therapeutic exercise;Balance training;Patient/family education   PT Goals (Current goals can be found in the Care Plan section) Acute Rehab PT Goals Patient Stated Goal: return home PT Goal Formulation: With family Time For Goal Achievement: 10/31/14 Potential to Achieve Goals: Fair    Frequency Min 1X/week   Barriers to discharge        Co-evaluation PT/OT/SLP Co-Evaluation/Treatment: Yes Reason for Co-Treatment: Necessary to address cognition/behavior during functional activity;For patient/therapist safety PT goals addressed during session: Balance;Mobility/safety with mobility         End of Session Equipment Utilized During Treatment: Gait belt Activity Tolerance: Patient tolerated treatment well Patient left: in chair;with call bell/phone within reach;with chair alarm set;with family/visitor present Nurse Communication: Mobility status;Need for lift equipment         Time: 1152-1224 PT Time Calculation (min) (ACUTE ONLY): 32 min   Charges:   PT Evaluation $Initial PT Evaluation Tier I: 1 Procedure     PT G CodesDelorse Lek 10/24/2014, 12:46 PM Delaney Meigs, PT [phone]

## 2014-10-24 NOTE — Progress Notes (Signed)
RN noted pt. with coarse crackles and expiratory wheezing in bilateral lobes. RRT notified. On call Family medicine paged. RN will continue to monitor pt. For changes in condition. Linda Grimmer, Cheryll Dessert

## 2014-10-24 NOTE — Progress Notes (Signed)
FPTS Interim Progress Note  S:Notified by RN about coarse crackles and expiratory wheezing however pt stable. Resp therapy was notified and suggested Lasix. Pt does not open eyes on request. Sitter at bedside.  O: BP 146/75 mmHg  Pulse 103  Temp(Src) 98.6 F (37 C) (Oral)  Resp 20  Ht 4' (1.219 m)  Wt 110 lb 7.2 oz (50.1 kg)  BMI 33.72 kg/m2  SpO2 100%  Gen: lying in bed with eyes closed in NAD Resp: Transmitted upper airway sounds with expiratory wheezes noted over the anterior chest wall. No increased WOB. Currently satting 100% on 3L [place] CV: RRR, exam difficult due to transmitted upper airway sounds. No pitting edema. No JVD noted.  A/P: Pt with a h/o CHF (last echo showed an EF 65% and grade 2 diastolic dysfunction). No pitting edema or JVD noted on exam. On chart review, pt previously satting 100% on 2L until ~4pm when she desatted to 86%, she has subsequently be on 3L since then. - Will hold off on Lasix for now as I do not feel the pt is truly volume overloaded and her Cr is 1.85 - Continue to monitor  - RN to notify MD if pt has increased O2 requirement overnight.  Joanna Puff, MD 10/24/2014, 1:01 AM PGY-1, [facility]

## 2014-10-24 NOTE — Evaluation (Signed)
Occupational Therapy Evaluation Patient Details Name: Kathleen [name] MRN: [id number] DOB: Dec 09, 1922 Today's Date: 10/24/2014    History of Present Illness Admitted with cough, decreased mentation, increased secretions and bil LE edema with PMHx of dementia, CHF, Afib, HTN   Clinical Impression   This 81 yo female admitted with above presents to acute OT with decreased balance, decreased mobility, decreased A/PROM of all joints Bil UEs, decreased cognition all increasing burden of care on her caregivers. Pt and family will benefit from one more session of OT for UE soft splinting and positioning.    Follow Up Recommendations  No OT follow up    Equipment Recommendations  Hospital bed;Wheelchair cushion (measurements OT) (hoyer lift)       Precautions / Restrictions Precautions Precautions: Fall Restrictions Weight Bearing Restrictions: No      Mobility Bed Mobility Overal bed mobility: Needs Assistance Bed Mobility: Supine to Sit     Supine to sit: Total assist;+2 for physical assistance     General bed mobility comments: pivot with pad with HOB 30 degrees and assist of pad to scoot to EOB  Transfers Overall transfer level: Needs assistance   Transfers: Stand Pivot Transfers     Squat pivot transfers: Total assist;+2 physical assistance     General transfer comment: max cues and assist with bil knees blocked, use of belt and pad to elevate and pivot sacrum to chair. Granddgtr educated for transfer technique as well as single person squat pivot with bobath technique with verbalized understanding    Balance Overall balance assessment: Needs assistance Sitting-balance support: No upper extremity supported;Feet supported Sitting balance-Leahy Scale: Zero Sitting balance - Comments: initially posterior right lean with transition to full anterior lean despite cues with max assist for balance     Standing balance-Leahy Scale: Zero                               ADL Overall ADL's : Needs assistance/impaired                                       General ADL Comments: Total A pta     Vision Additional Comments: No change from baseline          Pertinent Vitals/Pain Pain Assessment:  (PAIN AD=0)     Hand Dominance Right   Extremity/Trunk Assessment Upper Extremity Assessment Upper Extremity Assessment: RUE deficits/detail;LUE deficits/detail RUE Deficits / Details: Decreased ablilty to actively or passively opn her hand--contractures (OA) at [facility]. Daughter educated on the importance of keeping hands open as possible and clean as well as that nails needed to be trimmed so as to not potentially start "cutting" into pt's palm. Also decreased A/PROM all other joints RUE Coordination: decreased fine motor;decreased gross motor LUE Deficits / Details: Decreased ablilty to actively or passively opn her hand--tightness, but no contractures noted on this hand. Daughter educated on the importance of keeping hands open as possible and clean as well as that nails needed to be trimmed so as to not potentially start "cutting" into pt's palm..Also decreased A/PROM all other joints LUE Coordination: decreased fine motor;decreased gross motor   Lower Extremity Assessment Lower Extremity Assessment: Generalized weakness;RLE deficits/detail;LLE deficits/detail RLE Deficits / Details: knee extension 2-/5 LLE Deficits / Details: knee extension 2-/5   Cervical / Trunk Assessment Cervical / Trunk Assessment: Kyphotic  Communication Communication Communication: Expressive difficulties;Receptive difficulties   Cognition Arousal/Alertness: Lethargic Behavior During Therapy: Flat affect Overall Cognitive Status: Impaired/Different from baseline Area of Impairment: Attention   Current Attention Level: Sustained Memory: Decreased short-term memory         General Comments: pt unable to follow commands for mobility consistently  with eyes closed in supine and at rest in chair              Home Living Family/patient expects to be discharged to:: Private residence Living Arrangements: Children Available Help at Discharge: Family;Personal care attendant;Available 24 hours/day Type of Home: House Home Access: Ramped entrance     Home Layout: One level               Home Equipment: Wheelchair - [employer] - 2 wheels;Bedside commode   Additional Comments: Family desires return home with total care      Prior Functioning/Environment Level of Independence: Needs assistance  Gait / Transfers Assistance Needed: uses WC, assist to transfer to [facility] varies from min to total depending on pt mentation that day ADL's / Homemaking Assistance Needed: total A with bathing and dressing, grooming and feeding by family and caregiver. Pivots to [facility] holding WC at times when she is more alert (per grand-daughter; other times they basically have to lift her)        OT Diagnosis: Generalized weakness;Cognitive deficits   OT Problem List: Decreased range of motion;Decreased strength;Impaired balance (sitting and/or standing);Impaired UE functional use   OT Treatment/Interventions: Splinting (positioning of Bil UEs)    OT Goals(Current goals can be found in the care plan section) Acute Rehab OT Goals Patient Stated Goal: return home OT Goal Formulation: With family Time For Goal Achievement: 10/31/14 Potential to Achieve Goals: Good  OT Frequency: Min 2X/week           Co-evaluation PT/OT/SLP Co-Evaluation/Treatment: Yes Reason for Co-Treatment: Necessary to address cognition/behavior during functional activity;For patient/therapist safety PT goals addressed during session: Balance;Mobility/safety with mobility OT goals addressed during session: Strengthening/ROM;ADL's and self-care      End of Session    Activity Tolerance: Patient limited by fatigue Patient left: in chair;with call bell/phone within  reach;with family/visitor present   Time: 5726-2035 OT Time Calculation (min): 23 min Charges:  OT General Charges $OT Visit: 1 Procedure OT Evaluation $Initial OT Evaluation Tier I: 1 Procedure  Evette Georges [phone] 10/24/2014, 12:59 PM

## 2014-10-24 NOTE — Discharge Instructions (Signed)
Aspiration Precautions Aspiration is the inhaling of a liquid or object into the lungs. Things that can be inhaled into the lungs include:  Food.  Any type of liquid, such as drinks or saliva.  Stomach contents, such as vomit or stomach acid. When these things go into the lungs, damage can occur. Serious complications can then result, such as:  A lung infection (pneumonia).  A collection of pus in the lungs (lung abscess). CAUSES  A decreased level of awareness (consciousness) due to:  Traumatic brain injury or head injury.  Stroke.  Neurological disease.  Seizures.  Decreased or absent gag reflex (inability to cough).  Medical conditions that affect swallowing.  Conditions that affect the food pipe (esophagus) such as a narrowing of the esophagus (esophageal stricture).  Gastroesophageal reflux (GERD). This is also known as acid reflux.  Any type of surgery where you are put under general anesthesia or have sedation.  Drinking large amounts of alcohol.  Taking medication that causes drowsiness, confusion, or weakness.  Aging.  Dental problems.  Having a feeding tube. SYMPTOMS When aspiration occurs, different signs and symptoms can occur, such as:  Coughing (if a person has a cough or gag reflex) after swallowing food or liquids.  Difficulty breathing. This can include things like:  Breathing rapidly.  Breathing very slowly.  Loud breathing.  Hearing "gurgling" lung sounds when a person breaths.  Coughing up phlegm (sputum) that is:  Yellow, tan, or green in color.  Has pieces of food in it.  Bad smelling.  A change in voice (hoarseness) or a "gurgly" sound to the voice.  A change in skin color. The skin may turn red, or a "bluish" type color because of a lack of oxygen (cyanosis).  Fever.  Eyes watering.  Pain in the chest or back.  Facial grimacing .  A feeling of fullness in the throat or that something is stuck in the  throat. DIAGNOSIS  A chest X-ray may be performed. This takes a picture of your lungs. It can show changes in the lungs if aspiration has occurred.  A bronchoscopy may be performed. This is a surgical procedure in which a thin, flexible tube with a camera at the end is inserted into the nose or mouth. The tube is advanced to the lungs so your health care provider can view the lungs and obtain a culture, tissue sample, or remove an aspirated object.  A swallowing evaluation study may be performed to evaluate:  A person's risk of aspiration.  How difficult it is for a person to swallow.  What types of foods are safe for a person to eat. PREVENTION If you are a caregiver to someone who may aspirate, follow the directions below. If you are caring for someone who can eat and drink through their mouth:  Have them sit in an upright position when eating food or drinking fluids, such as:  Sitting up in a chair.  If sitting in a chair is not possible, position the person in bed so they are upright.  Remind the person to eat slowly and chew well.  Do not distract the person. This is especially important for people with thinking or memory (cognitive) problems.  Check the person's mouth for leftover food after eating.  Keep the person sitting upright for 30 to 45 minutes after eating.  Do not serve food or drink for at least 2 hours before bedtime. If you are caring for someone with a feeding tube and he or she   cannot eat or drink through their mouth:  Keep the person in an upright position as much as possible.  Do not  lay the person flat if they are getting continuous feedings. Turn the feeding pump off if you need to lay the person flat for any reason.  Check feeding tube residuals as directed by your health care provider. If a large amount of tube feedings are pulled back (aspirated) from the feeding tube, call your health care provider right away. General guidelines to prevent  aspiration include:  Feed small amounts of food. Do not force feed.  Use as little water as possible when brushing the person's teeth or cleaning his or her mouth.  Provide oral care before and after meals.  Never put food or fluids in the mouth of a person who is not fully alert.  Crush pills and put them in soft food such as pudding or ice cream. Some pills should not be crushed. Check with your health care provider before crushing any medication. SEEK IMMEDIATE MEDICAL CARE IF:   The person has trouble breathing or starts to breathe rapidly.  The person is breathing very slowly or stops breathing.  The person coughs a lot after eating or drinking.  The person has a chronic cough.  The person coughs up thick, yellow, or tan sputum.  The person has a fever or persistent symptoms for more than 72 hours.  The person has a fever and their symptoms suddenly get worse. Document Released: 07/30/2010 Document Revised: 07/02/2013 Document Reviewed: 10/02/2013 [employer] Patient Information 2015 [employer]. This information is not intended to replace advice given to you by your health care provider. Make sure you discuss any questions you have with your health care provider.  

## 2014-10-26 NOTE — Discharge Summary (Signed)
Family Medicine Teaching [facility] Discharge Summary  Patient name: Kathleen [name] Medical record number: [id number] Date of birth: 10-23-1922 Age: 81 y.o. Gender: female Date of Admission: 10/22/2014  Date of Discharge: 10/24/14 Admitting Physician: Nestor Ramp, MD  Primary Care Provider: Denny Levy, MD Consultants: None  Indication for Hospitalization: Cough, Right Lower Extremity Swelling  Discharge Diagnoses/Problem List:  Aspiration Pneumonitis Dementia Protein Calorie Malnutrition Atrial Fibrillation 2nd Degree AV Block CKD CHF  Disposition: Discharge Home with Home Health  Discharge Condition: Stable  Discharge Exam: Please refer to Progress Note from 10/24/14  Brief Hospital Course:  Kathleen [name] is a 81yo female who presented to the emergency department on 4/13 with worsening cough, increased mucous production, and right lower extremity swelling and tenderness. Also concerns for possible slurred speech, however family admits that Kathleen [name] is minimally verbal. CXR with right lower lobe atelectasis with possible pneumonia. CT head negative for acute processes. New oxygen requirement of 2L noted in ED after desaturation to 83% on room air. Bilateral lower extremity dopplers negative. D dimer elevated to 2.3, however ABG showed normal gradient. Troponins negative. No tachycardia or right heart strain observed on EKG. Azithromycin and Ceftriaxone started in ED. Admitted to [facility] Medicine Teaching Service.  Azithromycin and Ceftriaxone continued during hospitalization due to possible pneumonia on CXR. Fluids given. Home Norvasc continued during hospitalization. Repeat CXR on 4/14 showed atelectasis. Antibiotics discontinued. Noted to be hypertensive on 4/14, so dose of Norvasc increased to . Speech evaluation on 4/15 showed Kathleen [name] was at high risk of aspiration and had impaired swallow function. Suspect this is contributing to increased secretions and  cough. Kathleen [name] appeared more alert and closer to baseline on 4/15. Discharged home with dysphagia 3 diet, home health aid, and hospital bed for elevation of head of bed to decrease aspiration. Normal O2 saturation on room air noted prior to discharge.  Issues for Follow Up:  1. Follow up home health needs. Hospital bed ordered. 2. Follow up BP. Norvasc increased to . 3. Follow up diet. Discharged with Dysphagia 3 diet.  Significant Procedures: None  Significant Labs and Imaging:   Recent Labs Lab 10/22/14 1358 10/23/14 0529 10/24/14 0810  WBC 9.8 8.5 8.5  HGB 9.2* 8.7* 8.8*  HCT 26.5* 24.5* 25.2*  PLT 185 172 185    Recent Labs Lab 10/22/14 0509 10/22/14 1358 10/23/14 0529 10/24/14 0810  NA 142  --  144 141  K 5.4*  --  4.4 4.1  CL 103  --  112 110  CO2 24  --  20 20  GLUCOSE 127*  --  91 117*  BUN 54*  --  51* 41*  CREATININE 1.98* 2.13* 1.85* 1.77*  CALCIUM 9.5  --  8.3* 8.4  MG 2.0  --   --   --   PHOS 5.5*  --   --   --   ALKPHOS 75  --  64  --   AST 31  --  17  --   ALT 17  --  13  --   ALBUMIN 3.0*  --  2.5*  --    Urinalysis    Component Value Date/Time   COLORURINE YELLOW 10/23/2014 1859   APPEARANCEUR CLEAR 10/23/2014 1859   LABSPEC 1.016 10/23/2014 1859   PHURINE 5.0 10/23/2014 1859   GLUCOSEU NEGATIVE 10/23/2014 1859   HGBUR NEGATIVE 10/23/2014 1859   HGBUR neg 02/18/2013   BILIRUBINUR NEGATIVE 10/23/2014 1859   BILIRUBINUR NEG 04/23/2014 1353  KETONESUR NEGATIVE 10/23/2014 1859   PROTEINUR NEGATIVE 10/23/2014 1859   PROTEINUR 30 04/23/2014 1353   UROBILINOGEN 0.2 10/23/2014 1859   UROBILINOGEN 0.2 04/23/2014 1353   NITRITE NEGATIVE 10/23/2014 1859   NITRITE NEG 04/23/2014 1353   LEUKOCYTESUR NEGATIVE 10/23/2014 1859   LEUKOCYTESUR Trace* 02/18/2013  - ABG: pH 7.38, pCO2 39.7, pO2 80, Bicarb 23.9 - BNP 98.7 - Troponins negative - D dimer 2.37  Dg Chest 2 View  10/23/2014   CLINICAL DATA:  Cough, congestion, shortness of  breath  EXAM: CHEST  2 VIEW  COMPARISON:  10/22/2014  FINDINGS: Enlargement of cardiac silhouette.  Atherosclerotic calcification and mild tortuosity of thoracic aorta.  Mediastinal contours and pulmonary vascularity otherwise normal.  RIGHT hilum appears more prominent than on the previous exam though patient is rotated in the opposite direction.  Persistent atelectasis versus infiltrate at RIGHT base.  Remaining lungs clear.  No pleural effusion or pneumothorax.  Bones demineralized.  BILATERAL glenohumeral degenerative changes.  IMPRESSION: Enlargement of cardiac silhouette.  Persistent atelectasis versus consolidation RIGHT lower lobe.   Electronically Signed   By: Ulyses Southward M.D.   On: 10/23/2014 13:48   Dg Chest 2 View  10/22/2014   CLINICAL DATA:  Shortness of breath, cough and hypoxia. Nonverbal. History of dementia, CHF, hypertension.  EXAM: CHEST  2 VIEW  COMPARISON:  Chest radiograph October 10, 2014  FINDINGS: The cardiac silhouette is at least moderately enlarged, unchanged from prior imaging. Tortuous, possibly ectatic aorta. RIGHT lung base patchy airspace opacity without pleural effusion. No pneumothorax. Osteopenia. Limited lateral radiograph as patient's arms are not elevated.  IMPRESSION: Stable cardiomegaly. RIGHT lung base atelectasis, less likely early pneumonia.   Electronically Signed   By: Awilda Metro   On: 10/22/2014 04:53   Ct Head Wo Contrast  10/22/2014   CLINICAL DATA:  Shortness of breath, chest congestion, difficulty speaking for 2 days. Multiple recent falls. History of dementia, currently nonverbal.  EXAM: CT HEAD WITHOUT CONTRAST  TECHNIQUE: Contiguous axial images were obtained from the base of the skull through the vertex without intravenous contrast.  COMPARISON:  CT of the head September 09, 2014  FINDINGS: Moderate to severe ventriculomegaly, with parallel consideration of lateral horns and occipital horns, agenesis of the corpus callosum. No intraparenchymal hemorrhage,  mass effect, midline shift. No acute large vascular territory infarct. Mild white matter changes suggest chronic small vessel ischemic disease.  No abnormal extra-axial fluid collections. Basal cisterns are patent. Moderate calcific atherosclerosis of the carotid siphons.  No skull fracture. Widened atlantodental interval, unchanged with severe osteoarthrosis partially imaged. The included ocular globes and orbital contents are non-suspicious. Chronic RIGHT sphenoid sinusitis. Small LEFT posterior ethmoid mucosal retention cyst. The patient is edentulous.  IMPRESSION: No acute intracranial process.  Agenesis of the corpus callosum. Involutional changes and mild white matter changes suggest chronic small vessel ischemic disease.  Chronically widened atlantodental interval partially imaged with severe osteoarthrosis.   Electronically Signed   By: Awilda Metro   On: 10/22/2014 06:46   Results/Tests Pending at Time of Discharge: None  Discharge Medications:    Medication List    STOP taking these medications        azithromycin 200 MG/5ML suspension  Commonly known as:  ZITHROMAX     bisacodyl 10 MG suppository  Commonly known as:  DULCOLAX      TAKE these medications        albuterol (2.5 MG/3ML) 0.083% nebulizer solution  Commonly known as:  PROVENTIL  Take 3 mLs (2.5 mg total) by nebulization every 6 (six) hours as needed for wheezing or shortness of breath.     amLODipine 10 MG tablet  Commonly known as:  NORVASC  Take 1 tablet (10 mg total) by mouth daily.     artificial tears Oint ophthalmic ointment  Place into both eyes every 4 (four) hours as needed for dry eyes.     aspirin 81 MG tablet  Take 1 tablet (81 mg total) by mouth daily.     haloperidol 2 MG/ML solution  Commonly known as:  HALDOL  Take 0.5 mLs (1 mg total) by mouth every 6 (six) hours as needed for agitation.     magic mouthwash Soln  Take 10 mLs by mouth 4 (four) times daily.     magic mouthwash Soln   Take 5 mLs by mouth 4 (four) times daily as needed for mouth pain.     Melatonin 3 MG Tabs  Take 3 mg by mouth at bedtime as needed (sleep).     PATADAY 0.2 % Soln  Generic drug:  Olopatadine HCl  APPLY 1 DROP TO EYE 2 (TWO) TIMES DAILY.        Discharge Instructions: Please refer to Patient Instructions section of EMR for full details.  Patient was counseled important signs and symptoms that should prompt return to medical care, changes in medications, dietary instructions, activity restrictions, and follow up appointments.   Follow-Up Appointments:     Follow-up Information    Follow up with Denny Levy, MD On 11/05/2014.   Specialties:  Family Medicine, Sports Medicine   Why:  @ 10:45am for Hospital Follow Up   Contact information:   [address] [place] [address] [phone]       Araceli Bouche, DO 10/26/2014, 6:00 PM PGY-1, [facility] Family Medicine

## 2014-10-28 ENCOUNTER — Other Ambulatory Visit: Payer: Self-pay | Admitting: Family Medicine

## 2014-10-28 MED ORDER — IPRATROPIUM-ALBUTEROL 0.5-2.5 (3) MG/3ML IN SOLN: 3.0000 mL | RESPIRATORY_TRACT | Status: AC | PRN

## 2014-10-29 ENCOUNTER — Encounter: Payer: Self-pay | Admitting: Clinical

## 2014-10-29 ENCOUNTER — Other Ambulatory Visit: Payer: Medicare Other | Admitting: Family Medicine

## 2014-10-29 ENCOUNTER — Other Ambulatory Visit: Payer: Self-pay | Admitting: Family Medicine

## 2014-10-29 DIAGNOSIS — M810 Age-related osteoporosis without current pathological fracture: Secondary | ICD-10-CM

## 2014-10-29 DIAGNOSIS — F03C0 Unspecified dementia, severe, without behavioral disturbance, psychotic disturbance, mood disturbance, and anxiety: Secondary | ICD-10-CM

## 2014-10-29 DIAGNOSIS — F0390 Unspecified dementia without behavioral disturbance: Secondary | ICD-10-CM

## 2014-10-29 DIAGNOSIS — R262 Difficulty in walking, not elsewhere classified: Secondary | ICD-10-CM

## 2014-10-29 DIAGNOSIS — Z9181 History of falling: Secondary | ICD-10-CM | POA: Insufficient documentation

## 2014-10-29 DIAGNOSIS — M159 Polyosteoarthritis, unspecified: Secondary | ICD-10-CM

## 2014-10-29 NOTE — Progress Notes (Signed)
CSW faxed orders to [facility] for a hoyer lift and a seat cushion for pts wheel chair.   Theresia Bough, MSW, LCSW [phone]

## 2014-11-05 ENCOUNTER — Ambulatory Visit (INDEPENDENT_AMBULATORY_CARE_PROVIDER_SITE_OTHER): Payer: Medicare Other | Admitting: Family Medicine

## 2014-11-05 ENCOUNTER — Inpatient Hospital Stay: Payer: Medicare Other | Admitting: Family Medicine

## 2014-11-05 ENCOUNTER — Encounter: Payer: Self-pay | Admitting: Family Medicine

## 2014-11-05 VITALS — BP 126/50 | HR 64

## 2014-11-05 DIAGNOSIS — F03C0 Unspecified dementia, severe, without behavioral disturbance, psychotic disturbance, mood disturbance, and anxiety: Secondary | ICD-10-CM

## 2014-11-05 DIAGNOSIS — Z872 Personal history of diseases of the skin and subcutaneous tissue: Secondary | ICD-10-CM

## 2014-11-05 DIAGNOSIS — F0390 Unspecified dementia without behavioral disturbance: Secondary | ICD-10-CM | POA: Diagnosis present

## 2014-11-06 NOTE — Progress Notes (Signed)
   Subjective:    Patient ID: Kathleen [name], female    DOB: [birth year], 81 y.o.   MRN: [id number]  HPI Brought in by her granddaughter with concern for some genital irritation and bleeding. She had several loose bowel movements and they ended up having to clean her up several times in the last 2 or 3 days. During what is times and noticed that she was having some irritation and skin bleeding particularly in the groin area. It has since resolved. They started using some over-the-counter diaper rash cream on it and her aide says this morning it looks totally normal. They brought her in anyway just for me to give her a quick check and to see what they could use to prevent this problem in the future.   Review of Systems Since coming home she's had a couple really good days where she was very interactive and she seemed to have less muscle rigidity. She's also had a couple days where she was mostly sleeping.    Objective:   Physical Exam Vital signs are reviewed GENERAL: Thin female obviously very stiff sitting in a wheelchair. Psych/neuro: She follows with her eyes and seems alert. She will attempt an answer questions but her words don't always make sense. She's very pleasant. She seems to be in no distress. She is well dressed, clean. MSK: She has contractures of bilateral hands but she will open them on command area the palm area of the hand is without any sign of maceration. In general she has significant cogwheel rigidity of her extremities.       Assessment & Plan:  For the external genital irritation I recommended Desitin and/or Vaseline.

## 2014-11-14 ENCOUNTER — Emergency Department (HOSPITAL_BASED_OUTPATIENT_CLINIC_OR_DEPARTMENT_OTHER)
Admission: EM | Admit: 2014-11-14 | Discharge: 2014-11-14 | Disposition: A | Discharge status: Home / Self Care | Payer: Medicare Other | Attending: Emergency Medicine | Admitting: Emergency Medicine

## 2014-11-14 ENCOUNTER — Emergency Department (HOSPITAL_BASED_OUTPATIENT_CLINIC_OR_DEPARTMENT_OTHER): Payer: Medicare Other

## 2014-11-14 ENCOUNTER — Encounter (HOSPITAL_BASED_OUTPATIENT_CLINIC_OR_DEPARTMENT_OTHER): Payer: Self-pay

## 2014-11-14 DIAGNOSIS — Z862 Personal history of diseases of the blood and blood-forming organs and certain disorders involving the immune mechanism: Secondary | ICD-10-CM | POA: Diagnosis not present

## 2014-11-14 DIAGNOSIS — N189 Chronic kidney disease, unspecified: Secondary | ICD-10-CM | POA: Insufficient documentation

## 2014-11-14 DIAGNOSIS — M1990 Unspecified osteoarthritis, unspecified site: Secondary | ICD-10-CM | POA: Insufficient documentation

## 2014-11-14 DIAGNOSIS — Y9389 Activity, other specified: Secondary | ICD-10-CM | POA: Diagnosis not present

## 2014-11-14 DIAGNOSIS — Z87891 Personal history of nicotine dependence: Secondary | ICD-10-CM | POA: Diagnosis not present

## 2014-11-14 DIAGNOSIS — Y9289 Other specified places as the place of occurrence of the external cause: Secondary | ICD-10-CM | POA: Insufficient documentation

## 2014-11-14 DIAGNOSIS — S0990XA Unspecified injury of head, initial encounter: Secondary | ICD-10-CM | POA: Diagnosis not present

## 2014-11-14 DIAGNOSIS — J329 Chronic sinusitis, unspecified: Secondary | ICD-10-CM | POA: Insufficient documentation

## 2014-11-14 DIAGNOSIS — W19XXXA Unspecified fall, initial encounter: Secondary | ICD-10-CM

## 2014-11-14 DIAGNOSIS — I129 Hypertensive chronic kidney disease with stage 1 through stage 4 chronic kidney disease, or unspecified chronic kidney disease: Secondary | ICD-10-CM | POA: Insufficient documentation

## 2014-11-14 DIAGNOSIS — F0390 Unspecified dementia without behavioral disturbance: Secondary | ICD-10-CM | POA: Insufficient documentation

## 2014-11-14 DIAGNOSIS — Z79899 Other long term (current) drug therapy: Secondary | ICD-10-CM | POA: Insufficient documentation

## 2014-11-14 DIAGNOSIS — S022XXA Fracture of nasal bones, initial encounter for closed fracture: Secondary | ICD-10-CM | POA: Insufficient documentation

## 2014-11-14 DIAGNOSIS — I509 Heart failure, unspecified: Secondary | ICD-10-CM | POA: Diagnosis not present

## 2014-11-14 DIAGNOSIS — Z7982 Long term (current) use of aspirin: Secondary | ICD-10-CM | POA: Diagnosis not present

## 2014-11-14 DIAGNOSIS — Z23 Encounter for immunization: Secondary | ICD-10-CM | POA: Diagnosis not present

## 2014-11-14 DIAGNOSIS — Y998 Other external cause status: Secondary | ICD-10-CM | POA: Insufficient documentation

## 2014-11-14 DIAGNOSIS — R011 Cardiac murmur, unspecified: Secondary | ICD-10-CM | POA: Insufficient documentation

## 2014-11-14 MED ORDER — ACETAMINOPHEN 325 MG PO TABS
650.0000 mg | ORAL_TABLET | Freq: Once | ORAL | Status: AC
  Administered 2014-11-14: 650 mg via ORAL
  Filled 2014-11-14: qty 2

## 2014-11-14 MED ORDER — TETANUS-DIPHTH-ACELL PERTUSSIS 5-2.5-18.5 LF-MCG/0.5 IM SUSP
0.5000 mL | Freq: Once | INTRAMUSCULAR | Status: AC
  Administered 2014-11-14: 0.5 mL via INTRAMUSCULAR
  Filled 2014-11-14: qty 0.5

## 2014-11-14 NOTE — ED Notes (Addendum)
Grandson reports patient lives at home and is taken care of by her family - pt with dementia - pt had a witnessed fall by her son - pt fell out of the front of her wheelchair yesterday - pt has laceration (small) across the bridge of her nose - no bleeding, edema noted to nose with discoloration to orbital areas. Family denies that patient had LOC.

## 2014-11-14 NOTE — ED Notes (Signed)
Patient returned from CT

## 2014-11-14 NOTE — Discharge Instructions (Signed)
Return to the emergency room with worsening of symptoms, new symptoms or with symptoms that are concerning, specially fevers, vomiting, complaint of severe head pain, not acting like normal, less responsive. RICE: Rest, Ice (three cycles of 20 mins on, off at least twice a day), compression/brace, elevation. Head elevated 30 degrees during sleep. Tylenol for discomfort. Call to make an appointment with ENT for follow-up in 3-7 days. Number provided above. Read below information and follow recommendations. Nasal Fracture A nasal fracture is a break or crack in the bones of the nose. A minor break usually heals in a month. You often will receive black eyes from a nasal fracture. This is not a cause for concern. The black eyes will go away over 1 to 2 weeks.  DIAGNOSIS  Your caregiver may want to examine you if you are concerned about a fracture of the nose. X-rays of the nose may not show a nasal fracture even when one is present. Sometimes your caregiver must wait 1 to 5 days after the injury to re-check the nose for alignment and to take additional X-rays. Sometimes the caregiver must wait until the swelling has gone down. TREATMENT Minor fractures that have caused no deformity often do not require treatment. More serious fractures where bones are displaced may require surgery. This will take place after the swelling is gone. Surgery will stabilize and align the fracture. HOME CARE INSTRUCTIONS   Put ice on the injured area.  Put ice in a plastic bag.  Place a towel between your skin and the bag.  Leave the ice on for 15-20 minutes, 03-04 times a day.  Take medications as directed by your caregiver.  Only take over-the-counter or prescription medicines for pain, discomfort, or fever as directed by your caregiver.  If your nose starts bleeding, squeeze the soft parts of the nose against the center wall while you are sitting in an upright position for 10 minutes.  Contact sports  should be avoided for at least 3 to 4 weeks or as directed by your caregiver. SEEK MEDICAL CARE IF:  Your pain increases or becomes severe.  You continue to have nosebleeds.  The shape of your nose does not return to normal within 5 days.  You have pus draining from the nose. SEEK IMMEDIATE MEDICAL CARE IF:   You have bleeding from your nose that does not stop after 20 minutes of pinching the nostrils closed and keeping ice on the nose.  You have clear fluid draining from your nose.  You notice a grape-like swelling on the dividing wall between the nostrils (septum). This is a collection of blood (hematoma) that must be drained to help prevent infection.  You have difficulty moving your eyes.  You have recurrent vomiting. Document Released: 06/24/2000 Document Revised: 09/19/2011 Document Reviewed: 10/11/2010 [facility] Patient Information 2015 [place], [place]. This information is not intended to replace advice given to you by your health care provider. Make sure you discuss any questions you have with your health care provider.

## 2014-11-14 NOTE — ED Provider Notes (Signed)
CSN: [id number]     Arrival date & time 11/14/14  1828 History   First MD Initiated Contact with Patient 11/14/14 1934     Chief Complaint  Patient presents with  . Fall     (Consider location/radiation/quality/duration/timing/severity/associated sxs/prior Treatment) Level V caveat: Mention. History obtained from grandson and granddaughter who are bedside. HPI  Kathleen [name] is a 81 y.o. female presenting with witnessed fall yesterday. Patient was leaning forward in her wheelchair and fell flat on her face onto the hardwood floor. Patient with small laceration to bridge of her nose and associated edema and hematoma to orbital areas. Family denied loss of consciousness. Per family patient is mentating at her baseline but has not been complaining of pain.   Past Medical History  Diagnosis Date  . Hypertension   . Arthritis   . Spinal stenosis   . Osteoarthritis   . Anemia   . CHF (congestive heart failure)   . Heart murmur     aortic regurg  . Osteoporosis   . Chronic kidney disease   . Generalized OA 12/16/2011    Significant. Seems to have encompass all joints. Patient is complaining of significant pain but dementia allows patient to be happy.  . Dementia   . SunDown syndrome   . Atrial fibrillation   . 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 Paroxysmal atrial fibrillation 10/13/2010    Rate controlled Only on aspirin due to hx of bicep hematoma 09/2010) and significant fall risk   . CHF (congestive heart failure)    Past Surgical History  Procedure Laterality Date  . Nephrectomy Right   . Tonsillectomy     Family History  Problem Relation Age of Onset  . Hypertension Daughter    History  Substance Use Topics  . Smoking status: Former Games developer  . Smokeless tobacco: Never Used     Comment: quit smoking in the 70's  . Alcohol Use: No   OB History    No data available     Review of Systems  Unable to perform ROS due to dementia    Allergies  Review of  patient's allergies indicates no known allergies.  Home Medications   Prior to Admission medications   Medication Sig Start Date End Date Taking? Authorizing Provider  albuterol (PROVENTIL) (2.5 MG/3ML) 0.083% nebulizer solution Take 3 mLs (2.5 mg total) by nebulization every 6 (six) hours as needed for wheezing or shortness of breath. 08/11/14  Yes Nestor Ramp, MD  Alum & Mag Hydroxide-Simeth (MAGIC MOUTHWASH) SOLN Take 10 mLs by mouth 4 (four) times daily. Patient taking differently: Take 10 mLs by mouth 2 (two) times daily.  08/10/14  Yes Edsel Petrin, DO  Alum & Mag Hydroxide-Simeth (MAGIC MOUTHWASH) SOLN Take 5 mLs by mouth 4 (four) times daily as needed for mouth pain. 10/24/14  Yes Alyssa A Haney, MD  amLODipine (NORVASC) 10 MG tablet Take 1 tablet (10 mg total) by mouth daily. 10/24/14  Yes [place] N Rum[place], DO  artificial tears (LACRILUBE) OINT ophthalmic ointment Place into both eyes every 4 (four) hours as needed for dry eyes. 08/10/14  Yes Edsel Petrin, DO  aspirin 81 MG tablet Take 1 tablet (81 mg total) by mouth daily. 06/01/13  Yes Lonia Skinner, MD  ipratropium-albuterol (DUONEB) 0.5-2.5 (3) MG/3ML SOLN Take 3 mLs by nebulization every 4 (four) hours as needed. 10/28/14  Yes Nestor Ramp, MD  Melatonin 3 MG TABS Take 3 mg by mouth at  bedtime as needed (sleep).    Yes Historical Provider, MD  PATADAY 0.2 % SOLN APPLY 1 DROP TO EYE 2 (TWO) TIMES DAILY. 02/28/14  Yes Nestor Ramp, MD  haloperidol (HALDOL) 2 MG/ML solution Take 0.5 mLs (1 mg total) by mouth every 6 (six) hours as needed for agitation. Patient not taking: Reported on 09/09/2014 08/10/14   Edsel Petrin, DO   BP 146/65 mmHg  Pulse 90  Resp 26  SpO2 98% Physical Exam  Constitutional: She appears well-developed and well-nourished. No distress.  HENT:  Head: Normocephalic.  Mouth/Throat: Oropharynx is clear and moist.  1 cm linear laceration to bridge of nose without evidence of infection with surrounding  erythema and swelling. No raccoon eyes the patient with erythema and edema to bilateral upper and lower eyelids. Patient does not coil from palpation of mid face. No malocclusion. No septal hematoma.  Eyes: Conjunctivae and EOM are normal. Pupils are equal, round, and reactive to light. Right eye exhibits no discharge. Left eye exhibits no discharge.  Neck: Normal range of motion. Neck supple.  Cardiovascular: Normal rate and regular rhythm.   Pulmonary/Chest: Effort normal and breath sounds normal. No respiratory distress. She has no wheezes.  Abdominal: Soft. Bowel sounds are normal. She exhibits no distension. There is no tenderness.  Neurological: She is alert. She exhibits normal muscle tone.  Pt nonverbal. Follows simple commands and responds to verbal stimuli. Pt wheelchair bound and does not ambulate.   Skin: Skin is warm and dry. She is not diaphoretic.  Nursing note and vitals reviewed.   ED Course  Procedures (including critical care time) Labs Review Labs Reviewed - No data to display  Imaging Review Ct Head Wo Contrast  11/14/2014   CLINICAL DATA:  Laceration on the bridge of the nose after falling forward from a sitting position and striking her face on the ground.  EXAM: CT HEAD WITHOUT CONTRAST  CT MAXILLOFACIAL WITHOUT CONTRAST  TECHNIQUE: Multidetector CT imaging of the head and maxillofacial structures were performed using the standard protocol without intravenous contrast. Multiplanar CT image reconstructions of the maxillofacial structures were also generated.  COMPARISON:  Previous examinations, the most recent dated 10/22/2014.  FINDINGS: CT HEAD FINDINGS  Diffusely enlarged ventricles and subarachnoid spaces. The lateral ventricles remain parallel in orientation. Patchy white matter low density in both cerebral hemispheres. No skull fracture or intracranial hemorrhage.  CT MAXILLOFACIAL FINDINGS  Mildly comminuted anterior nasal bone fracture bilaterally. There is  significant inferior angulation of the distal fragments with 3 mm of depression. The anterior maxillary spine is intact. There is also a corticated defect in the floor of the left orbit, medially. There is herniated fat within the defect. This defect measures 9 mm across.  There is also a significant amount of retained secretions with a small amount of calcification in the sphenoid sinus on the right. There is a small amount of mucosal thickening or fluid in the posterior sphenoid sinus on the left. A left posterior ethmoid air cell is also opacified. There is also mild right maxillary sinus mucosal thickening with calcification. These findings in the sinuses are unchanged.  IMPRESSION: 1. Comminuted, angulated and depressed anterior nasal bone fracture. 2. 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 3. Stable mild chronic small vessel white matter ischemic changes in both cerebral hemispheres. 4. Chronic sphenoid and left ethmoid sinusitis. There is a small amount of calcification within the retained secretions in the sphenoid sinus and mucosal thickening in the  maxillary sinus on the right, suggesting the possibility of fungal sinusitis. 5. Old left orbital floor fracture with nonunion and herniated fat.   Electronically Signed   By: Beckie Salts M.D.   On: 11/14/2014 20:41   Ct Cervical Spine Wo Contrast  11/14/2014   CLINICAL DATA:  Fall from wheelchair yesterday. Patient in dementia. Cervicalgia/neck pain.  EXAM: CT CERVICAL SPINE WITHOUT CONTRAST  TECHNIQUE: Multidetector CT imaging of the cervical spine was performed without intravenous contrast. Multiplanar CT image reconstructions were also generated.  COMPARISON:  08/18/2009.  FINDINGS: Alignment: Cervicothoracic dextroconvex curve is present. This appears similar to the prior exam from 2011. Ankylosis is present at C2-C3, C3-C4 and C5-C6. 3 mm anterolisthesis of C7 on T1 appears degenerative from disc and facet disease.   Craniocervical junction: Chronic instability at the cervicothoracic junction associated with destructive changes of the facet joints. Widening of the predental space is present to 5 mm, also a chronic finding. Cranial settling is present with the odontoid protruding cranial to the clivus. Chronic moderate stenosis at the C1-C2 junction.  Vertebrae: severe degenerative endplate changes. No destructive osseous lesions or fractures.  Paraspinal soft tissues: Carotid atherosclerosis.  Lung apices: Normal.  The degenerative disc and facet disease appears similar to the prior exam. Not only a larger the C2-C3 facet joints destroyed, there is erosive change at the C6-C7 facet joints which accounts for at least some of the anterolisthesis of C7 on T1. Moderate multilevel central stenosis associated with disc osteophyte complexes.  IMPRESSION: 1. Chronic erosive changes at the C1-C2 junction with cranial settling and atlanto axial instability. All of these findings appear similar to the prior exam. Severe central stenosis at the C1-C2 junction is chronic. 2. Ankylosis from C2 through C4. 3. Degenerative anterolisthesis of C7 on T1 has increased compared to the prior exam and is secondary to degenerative disc and facet disease, with erosive changes of both facet joints. Erosive changes are most compatible with inflammatory arthropathy including rheumatoid or crystal arthropathy. 4. No acute fracture in this patient with recent trauma.   Electronically Signed   By: Andreas Newport M.D.   On: 11/14/2014 22:15   Ct Maxillofacial Wo Cm  11/14/2014   CLINICAL DATA:  Laceration on the bridge of the nose after falling forward from a sitting position and striking her face on the ground.  EXAM: CT HEAD WITHOUT CONTRAST  CT MAXILLOFACIAL WITHOUT CONTRAST  TECHNIQUE: Multidetector CT imaging of the head and maxillofacial structures were performed using the standard protocol without intravenous contrast. Multiplanar CT image  reconstructions of the maxillofacial structures were also generated.  COMPARISON:  Previous examinations, the most recent dated 10/22/2014.  FINDINGS: CT HEAD FINDINGS  Diffusely enlarged ventricles and subarachnoid spaces. The lateral ventricles remain parallel in orientation. Patchy white matter low density in both cerebral hemispheres. No skull fracture or intracranial hemorrhage.  CT MAXILLOFACIAL FINDINGS  Mildly comminuted anterior nasal bone fracture bilaterally. There is significant inferior angulation of the distal fragments with 3 mm of depression. The anterior maxillary spine is intact. There is also a corticated defect in the floor of the left orbit, medially. There is herniated fat within the defect. This defect measures 9 mm across.  There is also a significant amount of retained secretions with a small amount of calcification in the sphenoid sinus on the right. There is a small amount of mucosal thickening or fluid in the posterior sphenoid sinus on the left. A left posterior ethmoid air cell is also opacified. There  is also mild right maxillary sinus mucosal thickening with calcification. These findings in the sinuses are unchanged.  IMPRESSION: 1. Comminuted, angulated and depressed anterior nasal bone fracture. 2. 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 3. Stable mild chronic small vessel white matter ischemic changes in both cerebral hemispheres. 4. Chronic sphenoid and left ethmoid sinusitis. There is a small amount of calcification within the retained secretions in the sphenoid sinus and mucosal thickening in the maxillary sinus on the right, suggesting the possibility of fungal sinusitis. 5. Old left orbital floor fracture with nonunion and herniated fat.   Electronically Signed   By: Beckie Salts M.D.   On: 11/14/2014 20:41     EKG Interpretation None      MDM   Final diagnoses:  Head injury, initial encounter  Fall  Nasal bone fracture, closed,  initial encounter  Chronic sinusitis, unspecified location   Pt presenting after witnessed fall with head injury. Per family patient did not loss consciousness. Patient is mentating at baseline. Patient is wheelchair-bound and responds to verbal stimuli. He follows simple commands. Head, C-spine, maxillofacial CT without acute abnormalities except for comminuted angulated depressed anterior nasal bone fracture. Patient also with chronic sinusitis. Given referral to ear nose and throat and is to follow-up in 3-7 days. Pain managed in the ED. Patient stable for discharge.  Discussed return precautions with patient and family members. Discussed all results and patient and family members verbalizes understanding and agrees with plan.  This is a shared patient. This patient was discussed with the physician who saw and evaluated the patient and agrees with the plan.   Oswaldo Conroy, PA-C 11/15/14 1204  Rolan Bucco, MD 11/16/14 [phone]

## 2014-11-18 ENCOUNTER — Other Ambulatory Visit: Payer: Self-pay | Admitting: Family Medicine

## 2014-11-21 ENCOUNTER — Other Ambulatory Visit: Payer: Self-pay | Admitting: Family Medicine

## 2015-02-13 ENCOUNTER — Other Ambulatory Visit: Payer: Self-pay | Admitting: *Deleted

## 2015-02-13 MED ORDER — AMLODIPINE BESYLATE 10 MG PO TABS: 10.0000 mg | ORAL_TABLET | Freq: Every day | ORAL | Status: AC

## 2015-05-14 IMAGING — CR DG CHEST 2V
2 series · 2 of 2 positions shown · non-contrast
Comparison: Chest radiograph October 10, 2014

CLINICAL DATA: Shortness of breath, cough and hypoxia. Nonverbal.
History of dementia, CHF, hypertension.

EXAM:
CHEST  2 VIEW

[chest lat]
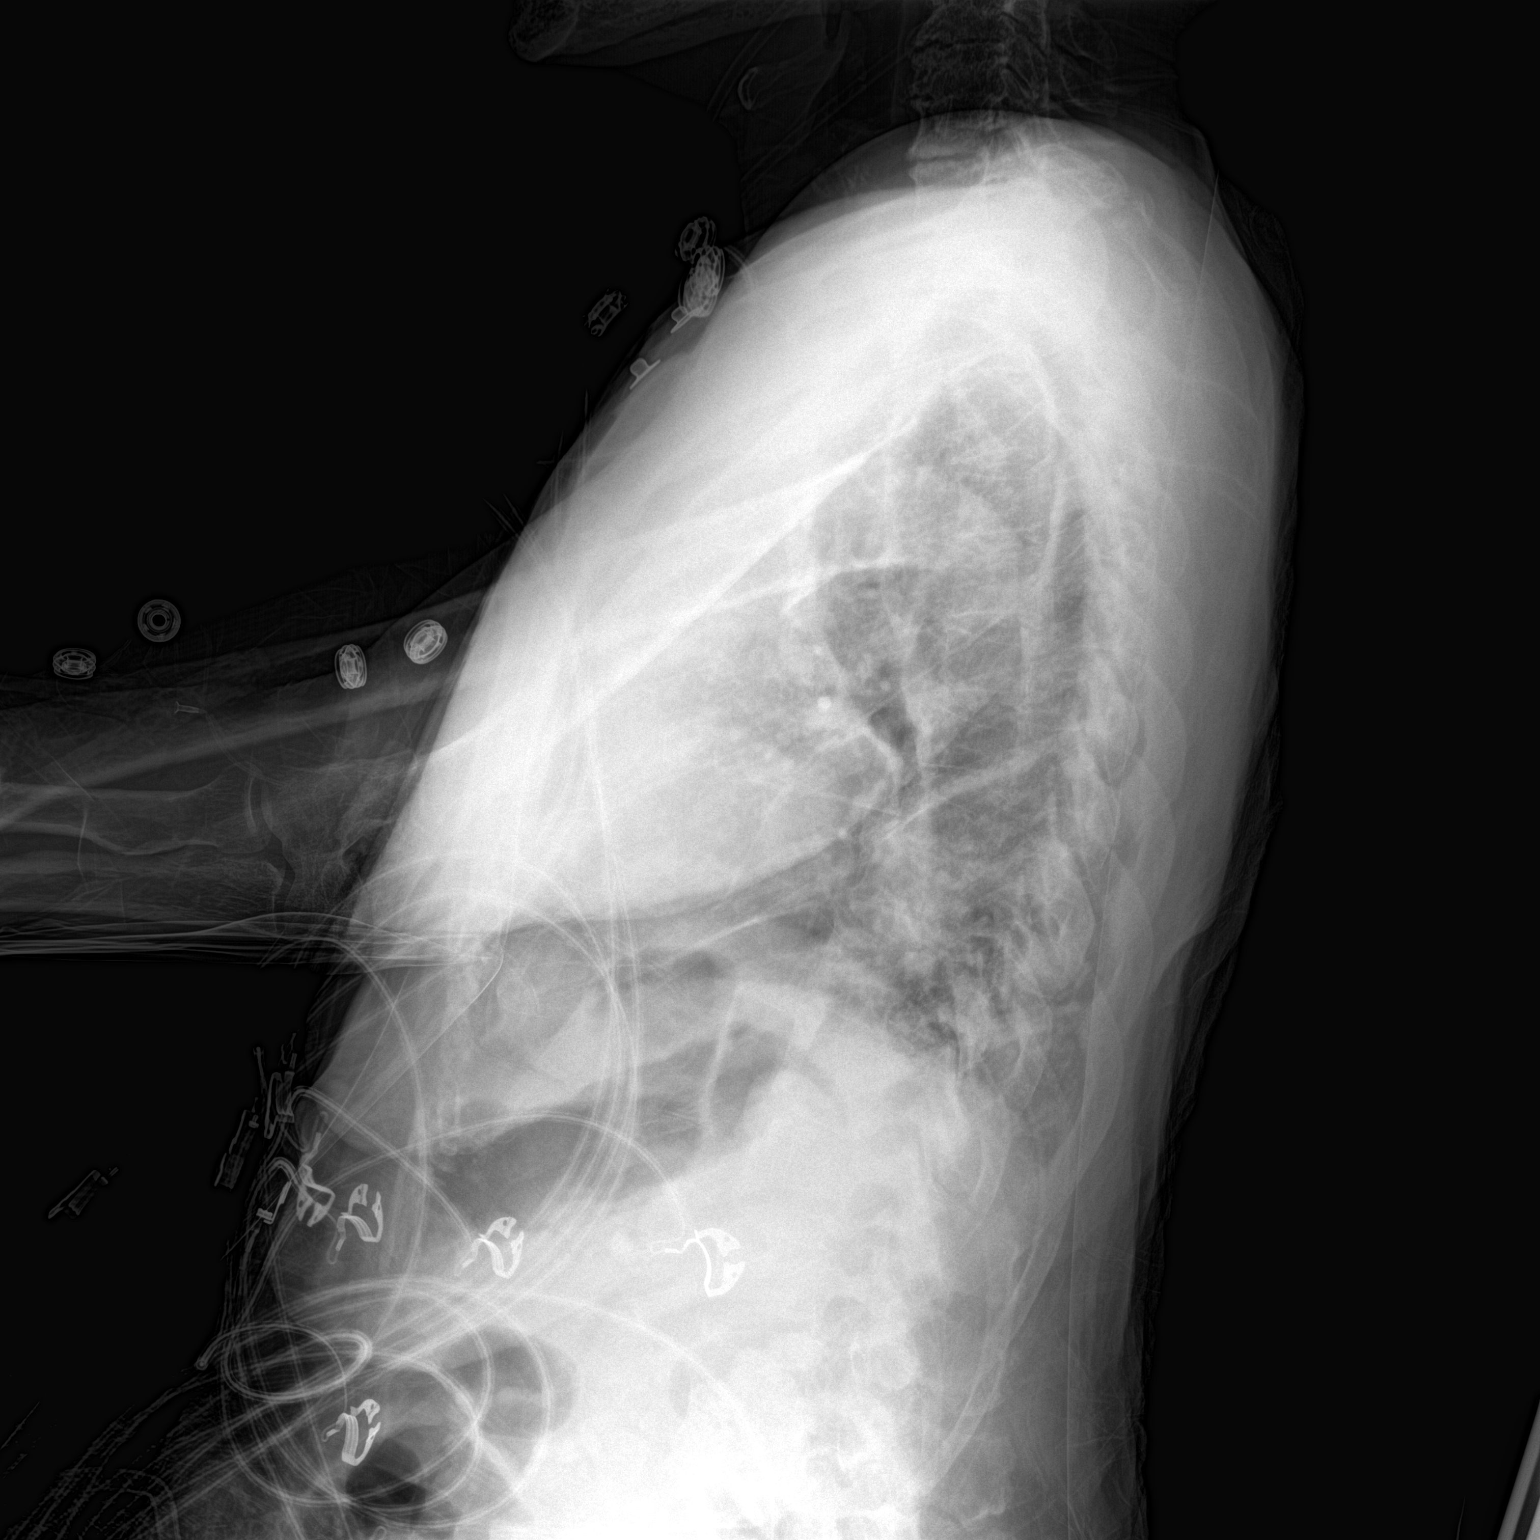

[chest ap]
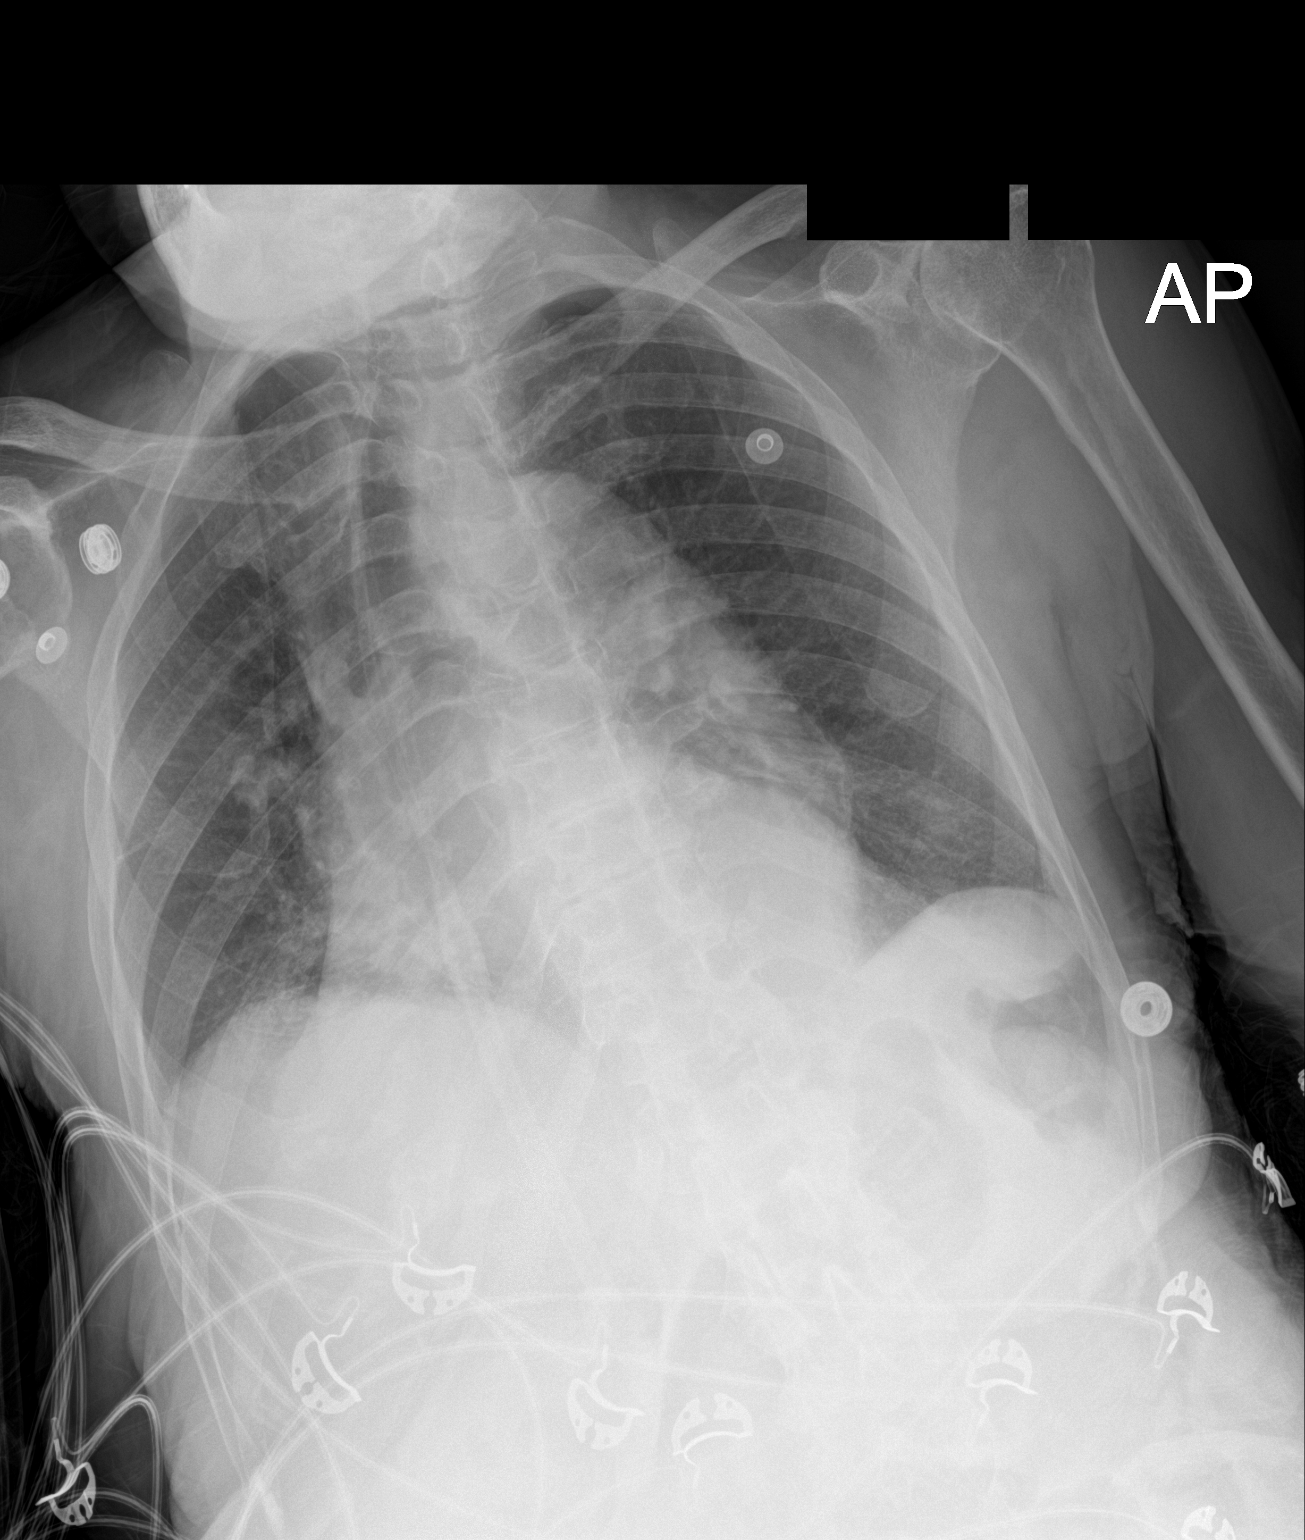

[2 of 2 positions shown; findings below may reference images not displayed]

FINDINGS: The cardiac silhouette is at least moderately enlarged, unchanged
from prior imaging. Tortuous, possibly ectatic aorta. RIGHT lung
base patchy airspace opacity without pleural effusion. No
pneumothorax. Osteopenia. Limited lateral radiograph as patient's
arms are not elevated.
IMPRESSION: Stable cardiomegaly. RIGHT lung base atelectasis, less likely early
pneumonia.

By: Mireya Baugh

## 2015-05-14 IMAGING — CT CT HEAD W/O CM
2 series · 15 of 30 positions shown, 19 images · non-contrast
Comparison: CT of the head September 09, 2014

CLINICAL DATA: Shortness of breath, chest congestion, difficulty
speaking for 2 days. Multiple recent falls. History of dementia,
currently nonverbal.

EXAM:
CT HEAD WITHOUT CONTRAST
TECHNIQUE: Contiguous axial images were obtained from the base of the skull
through the vertex without intravenous contrast.

[Series 201: head w/o, idose (1) · axial · non-contrast · 0.39mm/px · z∈[+142,+262]mm · 13 of 29 slices shown, 17 images]
[im 3/29  brain]
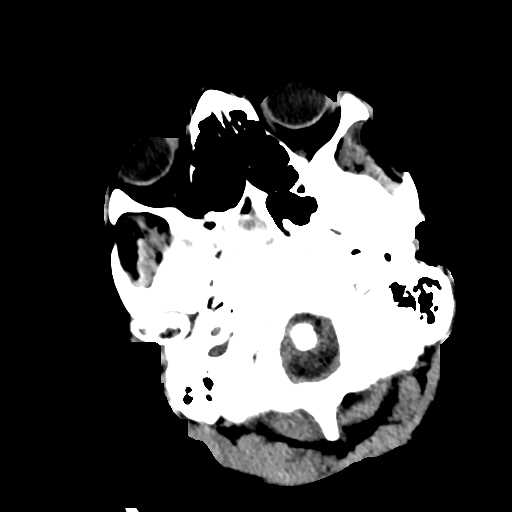
[im 3/29  bone]
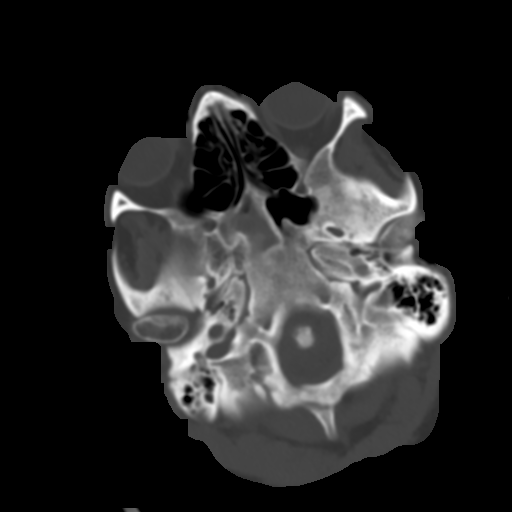
[im 5/29  brain]
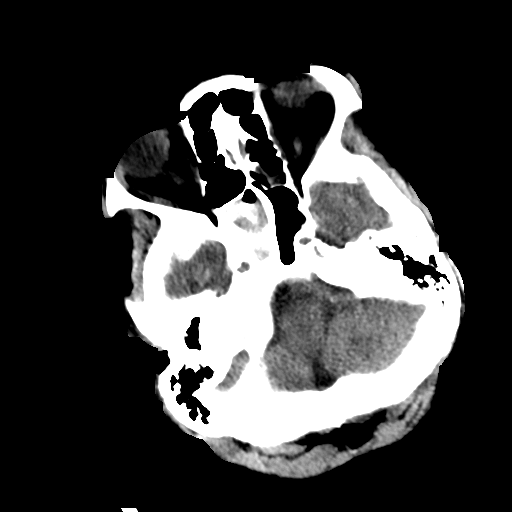
[im 7/29  brain]
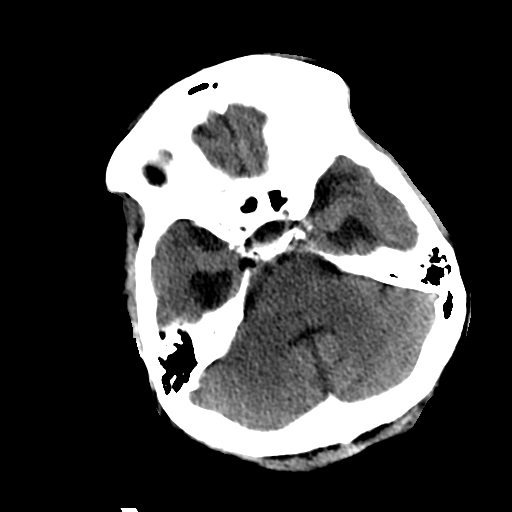
[im 9/29  brain]
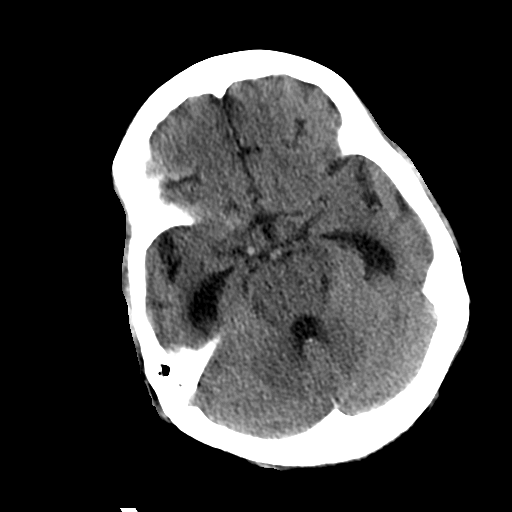
[im 11/29  brain]
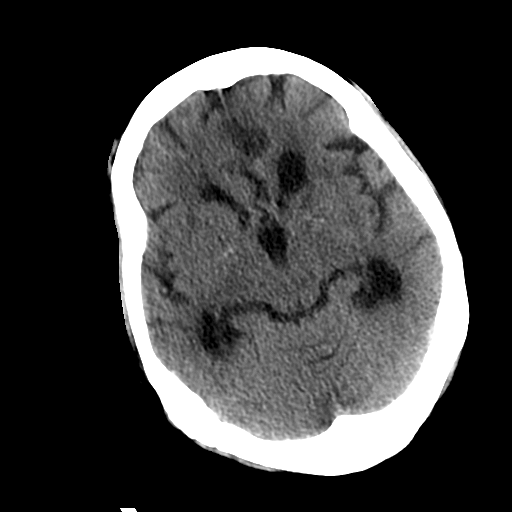
[im 11/29  bone]
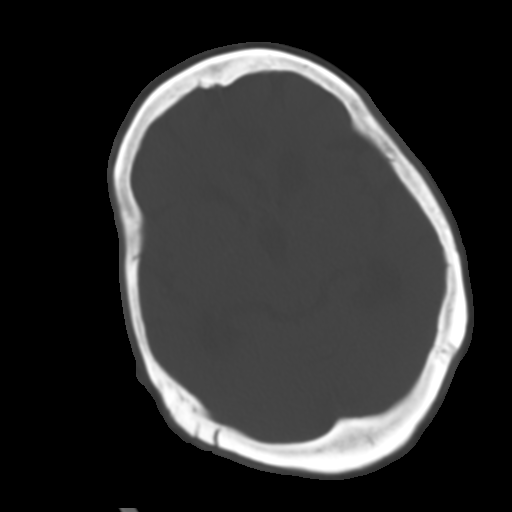
[im 13/29  brain]
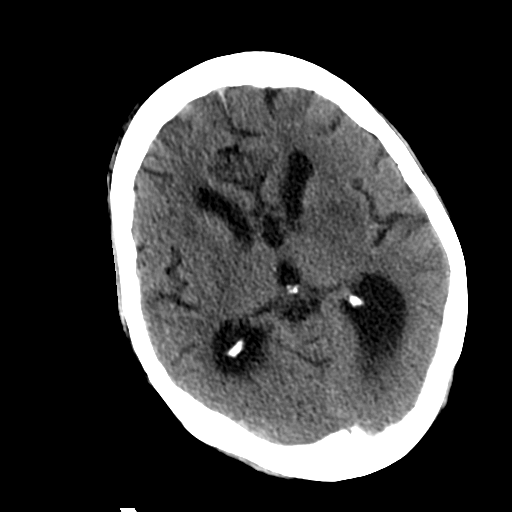
[im 15/29  brain]
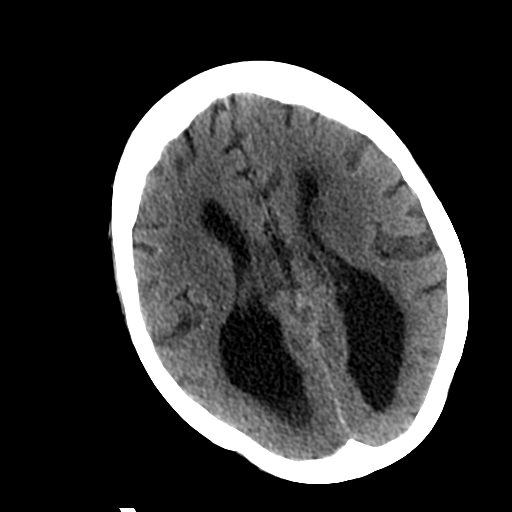
[im 17/29  brain]
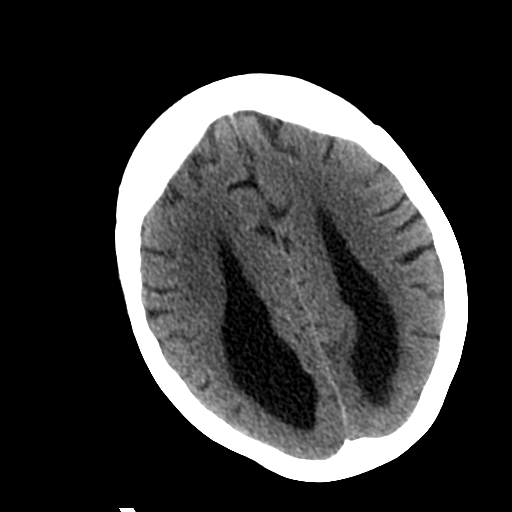
[im 19/29  brain]
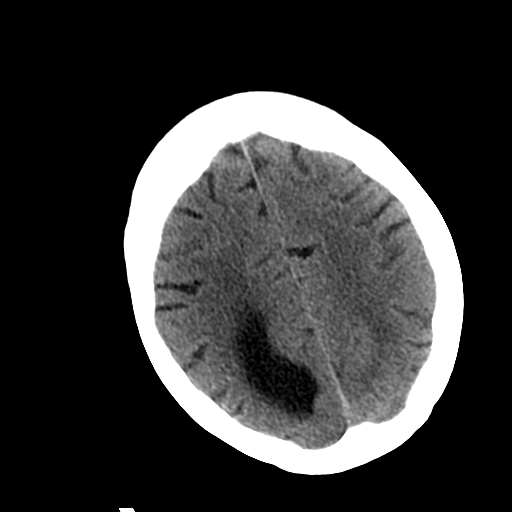
[im 19/29  bone]
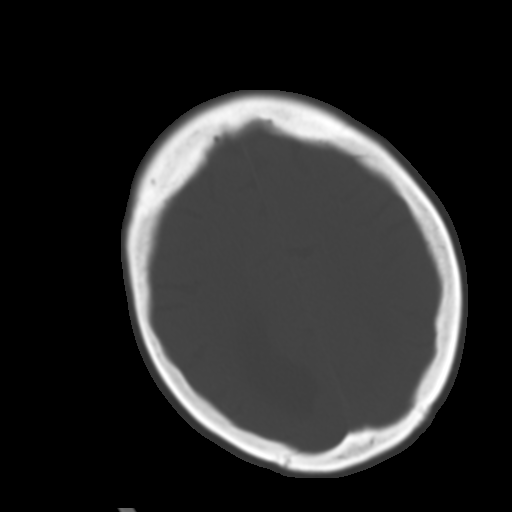
[im 21/29  brain]
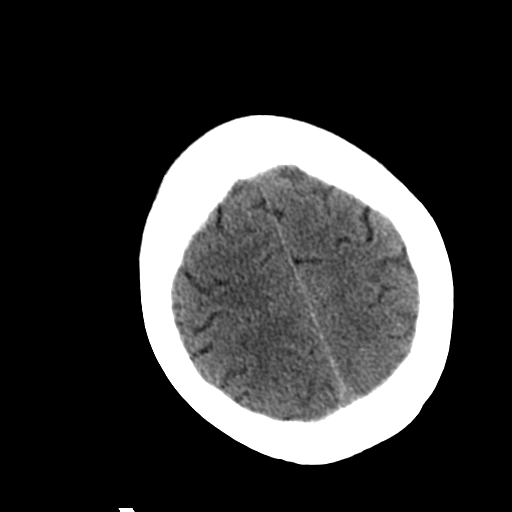
[im 23/29  brain]
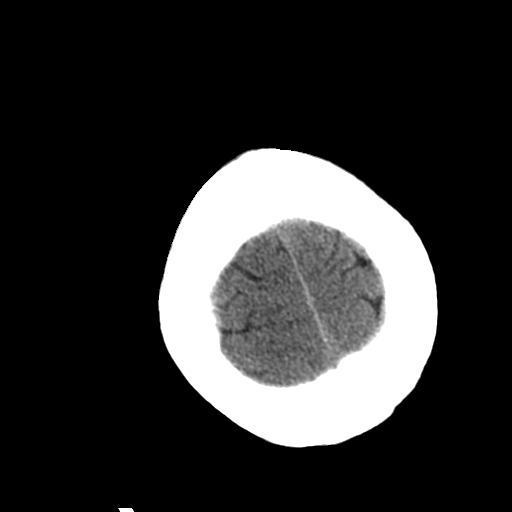
[im 25/29  brain]
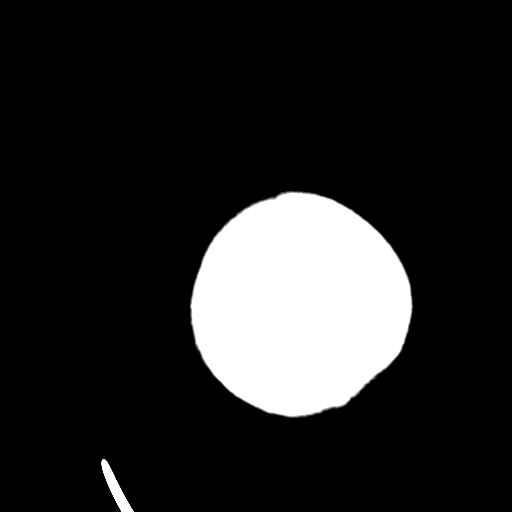
[im 27/29  brain]
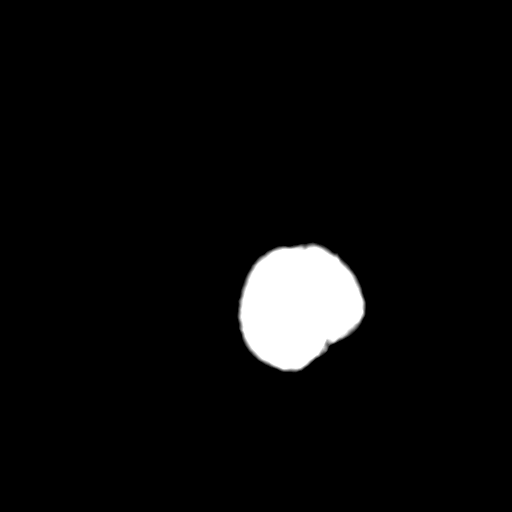
[im 27/29  bone]
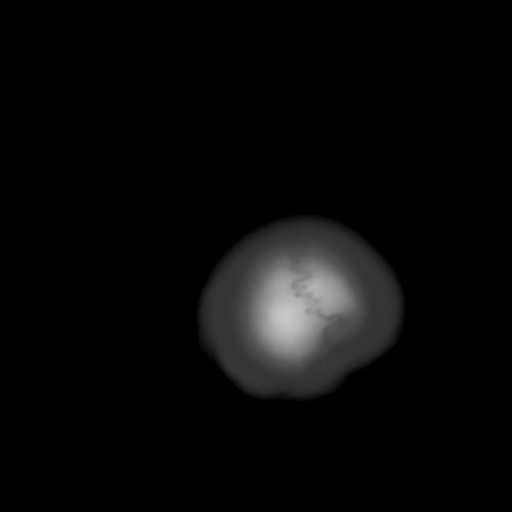

[Series 202: head w/o bone, idose (1) · axial · non-contrast · 0.39mm/px · z∈[+142,+162]mm · 2 of 29 slices shown]
[im 3/29  bone]
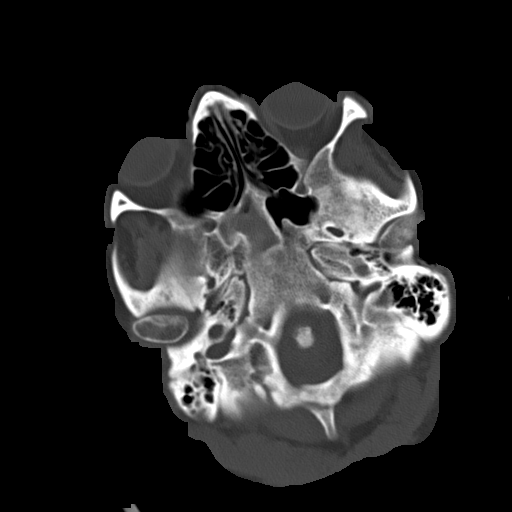
[im 7/29  bone]
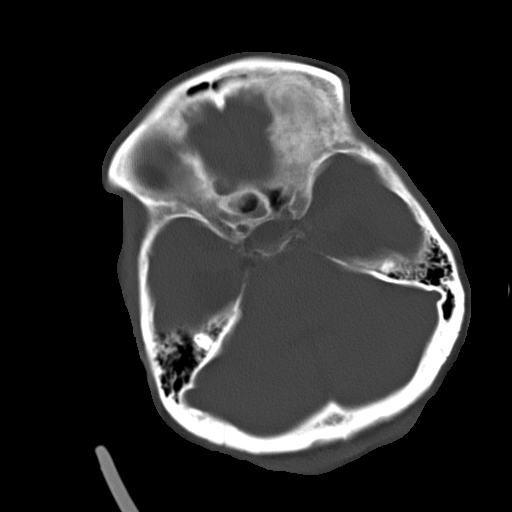

[15 of 30 positions shown; findings below may reference images not displayed]

FINDINGS: Moderate to severe ventriculomegaly, with parallel consideration of
lateral horns and occipital horns, agenesis of the corpus callosum.
No intraparenchymal hemorrhage, mass effect, midline shift. No acute
large vascular territory infarct. Mild white matter changes suggest
chronic small vessel ischemic disease.

No abnormal extra-axial fluid collections. Basal cisterns are
patent. Moderate calcific atherosclerosis of the carotid siphons.

No skull fracture. Widened atlantodental interval, unchanged with
severe osteoarthrosis partially imaged. The included ocular globes
and orbital contents are non-suspicious. Chronic RIGHT sphenoid
sinusitis. Small LEFT posterior ethmoid mucosal retention cyst. The
patient is edentulous.
IMPRESSION: No acute intracranial process.

Agenesis of the corpus callosum. Involutional changes and mild white
matter changes suggest chronic small vessel ischemic disease.

Chronically widened atlantodental interval partially imaged with
severe osteoarthrosis.

By: Janan Xasan

## 2015-06-24 ENCOUNTER — Other Ambulatory Visit: Payer: Self-pay | Admitting: Family Medicine

## 2015-06-24 MED ORDER — OLOPATADINE HCL 0.2 % OP SOLN: OPHTHALMIC | Status: AC

## 2015-06-24 NOTE — Telephone Encounter (Signed)
Needs refills on eye drops [facility] on [address]

## 2015-06-29 ENCOUNTER — Other Ambulatory Visit: Payer: Self-pay | Admitting: Family Medicine

## 2015-06-29 MED ORDER — ERYTHROMYCIN 5 MG/GM OP OINT: TOPICAL_OINTMENT | OPHTHALMIC | Status: AC

## 2015-08-24 ENCOUNTER — Telehealth: Payer: Self-pay | Admitting: Family Medicine

## 2015-08-24 NOTE — Telephone Encounter (Signed)
Spoke with Halima, she is going to try and find transportation for patient and then call back to set up nurse visit either today or tomorrow.

## 2015-08-24 NOTE — Telephone Encounter (Addendum)
Recd text and photo of an ulcer on her buttocks--they were asking for dressing supplies.  \ RN team Can u call them and have her put on a nurse visit today or tomorrow--we need to actually see this and then we can decide what it needs. I will be around most of today and tomorrow so grab me and I will look at it with you. THANKS! Denny Levy PS Kathleen [name] is the one who sent me teh text

## 2015-08-25 ENCOUNTER — Ambulatory Visit (INDEPENDENT_AMBULATORY_CARE_PROVIDER_SITE_OTHER): Payer: Medicare Other | Admitting: Family Medicine

## 2015-08-25 ENCOUNTER — Encounter: Payer: Self-pay | Admitting: Family Medicine

## 2015-08-25 VITALS — BP 128/52 | HR 65 | Temp 97.7°F

## 2015-08-25 DIAGNOSIS — L89152 Pressure ulcer of sacral region, stage 2: Secondary | ICD-10-CM | POA: Diagnosis present

## 2015-08-25 NOTE — Patient Instructions (Signed)
[facility] should be in touch with you Use the nonstick pads for now Follow up with Dr. Jennette Kettle in a few weeks  Be well, Dr. Pollie Meyer

## 2015-08-25 NOTE — Progress Notes (Signed)
Date of Visit: 08/25/2015   HPI:  Patient presents for a same day appointment to discuss sacral decubitus ulcer.  Has had sore on bottom for a few weeks. Patient has history of severe dementia and is unable to walk at baseline. Has had prior sacral ulcers in the past, but none were as big as this one. Family has tried various types of bandages and have tried offloading the area, but it's gotten worse. She has a hospital bed at home with a normal hospital type mattress. No fevers, decreased PO intake, or vomiting. Has otherwise acted at baseline.   ROS: See HPI  PMFSH: hx of CHF, stage 3 CKD, severe dementia, htn, anemia, generalized osteoarthritis, osteoporosis, paroxysmal atrial fibrillation, wenckebach second degree AV block  PHYSICAL EXAM: BP 128/52 mmHg  Pulse 65  Temp(Src) 97.7 F (36.5 C) (Oral)  Wt  Gen: NAD, cooperative, pleasantly demented HEENT: NCAT Heart: regular rate and rhythm, no murmur Lungs: clear to auscultation bilaterally, normal work of breathing  Abdomen: soft nontender to palpation Skin: large ~8cm stage 2 sacral decubitus ulcer present on sacral area, just left of mildline. No bleeding, surrounding cellulitis, or purulence.   ASSESSMENT/PLAN:  1. Stage 2 sacral decubitus ulcer - due to advanced dementia and immobility. No signs of systemic infection or surrounding cellulitis.  - home health wound care nursing ordered - would benefit from low loss mattress to help with offloading the area - instructed family to apply nonstick pads in the interim until wound care nursing is set up - family appreciative & agreeable to this plan   FOLLOW UP: Follow up in several weeks with Dr. Jennette Kettle for sacral ulcer Setting up [facility] RN wound care  [place] J. Pollie Meyer, MD [facility]

## 2015-08-26 ENCOUNTER — Telehealth: Payer: Self-pay | Admitting: Family Medicine

## 2015-08-26 NOTE — Telephone Encounter (Signed)
Needs wound care orders-can be verbal

## 2015-08-28 NOTE — Telephone Encounter (Signed)
Kathleen [name] i am not sure WHO needs wound care orders. Can u track this down? S

## 2015-08-28 NOTE — Telephone Encounter (Signed)
Return call to Selena Batten, RN with [facility].  She is requesting verbal orders for SilvaSorb dressing, cover with dry dressing daily and changing PRN if soiled.  Verbal order given by Dr. Jennette Kettle.  Clovis Pu, RN

## 2015-08-29 DIAGNOSIS — L89152 Pressure ulcer of sacral region, stage 2: Secondary | ICD-10-CM | POA: Insufficient documentation

## 2015-09-02 ENCOUNTER — Telehealth: Payer: Self-pay | Admitting: Family Medicine

## 2015-09-02 NOTE — Telephone Encounter (Signed)
Kathleen [name] from [employer] informed and will call the company. Lori-Ann Lindfors, Maryjo [place]

## 2015-09-02 NOTE — Telephone Encounter (Signed)
Kathie Rhodes from [facility] calls, pt's family stated when patient was last seen by Dr. Pollie Meyer she stated that she would be sending orders for a low loss mattress. Kathie Rhodes would like to know where this order was sent, if sent, so she can call and check the status. Please call Kathie Rhodes at [phone].

## 2015-09-02 NOTE — Telephone Encounter (Signed)
I was not able to find the order for the mattress specifically. Red team, can you call and give verbal order and ask them to fax something over to [place] to sign?  Thanks, Latrelle Dodrill, MD

## 2015-09-29 ENCOUNTER — Telehealth: Payer: Self-pay | Admitting: *Deleted

## 2015-09-29 NOTE — Telephone Encounter (Signed)
Katina Degree called because it has been 11 days since he has sent a request for your signature for pts bed and gel pad. He will be faxing paper to my attn and i will make sure it gets to you. Da Michelle Bruna Potter, CMA

## 2015-09-29 NOTE — Telephone Encounter (Signed)
I recall signing something about this and faxing it back. Perhaps it never went through?  Thanks [place]

## 2015-09-29 NOTE — Telephone Encounter (Signed)
Papers signed by dr Pollie Meyer and faxed back. Orian Amberg Bruna Potter, CMA

## 2015-10-14 ENCOUNTER — Telehealth: Payer: Self-pay | Admitting: Family Medicine

## 2015-10-14 NOTE — Telephone Encounter (Signed)
Buzz is calling to see if the doctor wants the patient to be re certified for his services for wound care. He goes out one time a week and check the wound. The patient does have a full time caregiver who also changes the dressing 6 days a week. Buzz said if you would give him a call and let him know if he should continue with this or stop. Please call and let him know what he should do. jw

## 2015-10-16 NOTE — Telephone Encounter (Signed)
.  J

## 2015-10-20 NOTE — Telephone Encounter (Signed)
Dear [place] A[facility] Team Looks like they have closed out the visits---I just received the d/c summary Please ask [name] if she wants [place] to recertify this If so, please give them verbal order to do so THANKS! Denny Levy

## 2015-10-21 NOTE — Telephone Encounter (Signed)
Done Roxann Vierra  

## 2015-10-21 NOTE — Telephone Encounter (Signed)
Spoke with [name], she would like for [employer] [facility] [place]) to continue care.  Contacted Buzz, he will need a Rx sent to him documenting "please continue wound care for pt".  It needs to be faxed to [phone]. [place], Maryjo [place], CMA

## 2015-10-26 ENCOUNTER — Telehealth: Payer: Self-pay | Admitting: Family Medicine

## 2015-10-26 NOTE — Telephone Encounter (Signed)
RN team I received a request for notes on progress of her buttock ulcer.  We have not seen her back---HH has been doing dressings.  Can you call the [facility] company (Buzz at [facility]  [phone]) and ask them to send me their most recent note---must include---current dimensions of buttock ulcer.   IF that  is not available, then Kathleen [name] will need to make a nurse visit so we can document the improvement.   Without this info, the insurance company will not pay for her supplies. THANKS! Denny Levy

## 2015-10-26 NOTE — Telephone Encounter (Signed)
OK UPDATE   Just got a note from HH---they have discharged her. Soooo---I need someone to send a picture again of her ulcer---OR have someone at home measure it. That way I can order supplies for them. And she would not have to come in [facility]! Denny Levy

## 2015-10-26 NOTE — Telephone Encounter (Signed)
Called family regarding the buttock ulcer.  They will take a picture and measure it for the PCP.  They will send picture to Dr. Jennette Kettle.  Clovis Pu, RN

## 2015-10-29 NOTE — Telephone Encounter (Signed)
Halima, pt's granddaughter calls. States she took and sent pictures to Dr. Jennette Kettle. Would now like to know what the new step it. Pls advise.

## 2015-10-30 NOTE — Telephone Encounter (Signed)
Kathleen [name] with Home health needs progress notes and order for patient regarding wound. Please contact her at [phone]

## 2015-11-02 ENCOUNTER — Telehealth: Payer: Self-pay | Admitting: Family Medicine

## 2015-11-02 ENCOUNTER — Encounter: Payer: Self-pay | Admitting: Family Medicine

## 2015-11-02 NOTE — Telephone Encounter (Signed)
Returned call to [place], however spoke with [employer]. They are requesting doctor's note stating that patient need more supplies and what the stage, measurement and any drainage reported on ulcer.  Office notes printed and faxed.  Clovis Pu, RN

## 2015-11-02 NOTE — Telephone Encounter (Signed)
RN team I am a little confused. First I received a request from Buzz (at [facility]) asking fr renewal of [facility] services for  Continued dressing changes for Mrs. Fedak's buttock ulcers.  I must have delayed too long because when we called back i received a note that the case had already been closed.  Then I received a request for supplies so family could continue changing (which is probably OK). To fill that out I had her grand-daughter send me an updated picture --so tey would not have to drag Mrs. Froese in just so I could look at her wound. I received picture and updated chart.  NOW I get this request: see prev phone note .Marland Kitchen"Gabby with Home health needs progress notes and order for patient regarding wound. Please contact her at [phone]"  Is [facility] with [facility]? Can you either send her a copy of my documentation from today and give her the order or clarify if the family is doing the dressing changes. THANKS!!!

## 2015-11-02 NOTE — Progress Notes (Addendum)
Patient ID: Kathleen [name], female   DOB: [birth year], 82 y.o.   MRN: [id number] Received pictures from Mrs. Hauss's buttock ulcers. Much improved. She now has two ulcers, one is stage 2 and it is about 3 cm X 5 cm. The other is stage 2-3 and it is about 4 cm X 7 cm. Both have healthy looking granulation tissue. Drainage per report and appearance is clear minimal but daily.   I am asking Home Health to either train family to continue dressings or have them do another 4-6 weeks of home visits as they have been doing. I have  also filled out forms for supplies.  REGIMEN: Daily cleanse with normal saline. Dry. Apply silvasorb cream and cover with duoderm type  Dressing. Also change dressing if soiled or contaminated with feces.  Denny Levy

## 2015-11-24 ENCOUNTER — Encounter: Payer: Self-pay | Admitting: Family Medicine

## 2015-11-24 NOTE — Progress Notes (Signed)
Patient ID: Kathleen [name], female   DOB: 1922/07/29, 82 y.o.   MRN: [id number] Called by grandaughter re- bandages. We need to switch to optfoam--butterfly or use kerlex to wrap Change daily for heavy drainage. She will come by tomorrow for me to review uklcer. [phone]

## 2015-11-24 NOTE — Progress Notes (Signed)
[facility] [phone] Fax [phone]

## 2015-11-25 ENCOUNTER — Ambulatory Visit (INDEPENDENT_AMBULATORY_CARE_PROVIDER_SITE_OTHER): Payer: Medicare Other | Admitting: Family Medicine

## 2015-11-25 DIAGNOSIS — L8993 Pressure ulcer of unspecified site, stage 3: Secondary | ICD-10-CM

## 2015-11-25 NOTE — Progress Notes (Signed)
   Subjective:    Patient ID: Kathleen [name], female    DOB: Mar 17, 1923, 82 y.o.   MRN: [id number]  HPI Buttock ulcer follow up   Review of Systems Pertinent review of systems: negative for fever or unusual weight change.     Objective:   Physical Exam Elderly female NAD Buttock area; Left: two separate ulcerations; one is stage 2 and it is about 3 cm X 3 cm.  The other is stage 2-3 and it is about 43 cm X 5 cm. Both have healthy looking granulation tissue. Drainage appears to be moderate to heavy, clear. No blood.      Assessment & Plan:  Continue home dressing changes. Some improvement. No sign of infection. Given underlying health status and age these may not fully heal any time soon, but at least not expanding. Will try to get some type dressing  For moderate to heavy drainage with minimal amount of adhesive (butterfly type dressing) or we can use optifoam type dressing, cover with kerlex wrapped around pelvis to avoid adhesive use as her skin is pretty thin.

## 2015-11-26 ENCOUNTER — Encounter: Payer: Self-pay | Admitting: Family Medicine

## 2015-11-28 ENCOUNTER — Other Ambulatory Visit: Payer: Self-pay | Admitting: Family Medicine

## 2015-11-28 MED ORDER — AZITHROMYCIN 200 MG/5ML PO SUSR: ORAL | Status: AC

## 2015-11-28 NOTE — Progress Notes (Signed)
Call from family (sent video) of her coughing. Productive. Temp 101.3 per family. Improved with tylenol. They wish to avoid hospitalization. Will try oral abx at home.Continue tylenol.

## 2015-12-30 ENCOUNTER — Telehealth: Payer: Self-pay | Admitting: *Deleted

## 2015-12-30 NOTE — Telephone Encounter (Signed)
Kathleen [name], Hospice and Palliative Care given verbal order for Dr. Jennette Kettle to be patient's attending physician and would like standing hospice care orders activated.  Clovis Pu, RN

## 2016-02-06 ENCOUNTER — Other Ambulatory Visit: Payer: Self-pay | Admitting: Family Medicine

## 2016-02-06 MED ORDER — COLLAGENASE 250 UNIT/GM EX OINT: 1.0000 "application " | TOPICAL_OINTMENT | Freq: Every day | CUTANEOUS | 3 refills | Status: AC

## 2016-02-17 ENCOUNTER — Emergency Department (HOSPITAL_COMMUNITY)

## 2016-02-17 ENCOUNTER — Emergency Department (HOSPITAL_COMMUNITY)
Admission: EM | Admit: 2016-02-17 | Discharge: 2016-02-17 | Disposition: A | Discharge status: Home / Self Care | Attending: Emergency Medicine | Admitting: Emergency Medicine

## 2016-02-17 ENCOUNTER — Encounter (HOSPITAL_COMMUNITY): Payer: Self-pay | Admitting: Emergency Medicine

## 2016-02-17 DIAGNOSIS — N183 Chronic kidney disease, stage 3 (moderate): Secondary | ICD-10-CM | POA: Diagnosis not present

## 2016-02-17 DIAGNOSIS — J690 Pneumonitis due to inhalation of food and vomit: Secondary | ICD-10-CM | POA: Diagnosis not present

## 2016-02-17 DIAGNOSIS — Z79899 Other long term (current) drug therapy: Secondary | ICD-10-CM | POA: Insufficient documentation

## 2016-02-17 DIAGNOSIS — I509 Heart failure, unspecified: Secondary | ICD-10-CM | POA: Insufficient documentation

## 2016-02-17 DIAGNOSIS — Z7982 Long term (current) use of aspirin: Secondary | ICD-10-CM | POA: Insufficient documentation

## 2016-02-17 DIAGNOSIS — I130 Hypertensive heart and chronic kidney disease with heart failure and stage 1 through stage 4 chronic kidney disease, or unspecified chronic kidney disease: Secondary | ICD-10-CM | POA: Diagnosis not present

## 2016-02-17 DIAGNOSIS — Z87891 Personal history of nicotine dependence: Secondary | ICD-10-CM | POA: Insufficient documentation

## 2016-02-17 DIAGNOSIS — R0602 Shortness of breath: Secondary | ICD-10-CM | POA: Diagnosis present

## 2016-02-17 LAB — CBC WITH DIFFERENTIAL/PLATELET
BASOS ABS: 0 10*3/uL (ref 0.0–0.1)
BASOS PCT: 0 %
EOS ABS: 0 10*3/uL (ref 0.0–0.7)
Eosinophils Relative: 0 %
HCT: 25.7 % — ABNORMAL LOW (ref 36.0–46.0)
HEMOGLOBIN: 8.5 g/dL — AB (ref 12.0–15.0)
LYMPHS ABS: 0.9 10*3/uL (ref 0.7–4.0)
Lymphocytes Relative: 9 %
MCH: 27.6 pg (ref 26.0–34.0)
MCHC: 33.1 g/dL (ref 30.0–36.0)
MCV: 83.4 fL (ref 78.0–100.0)
Monocytes Absolute: 0.3 10*3/uL (ref 0.1–1.0)
Monocytes Relative: 3 %
NEUTROS PCT: 88 %
Neutro Abs: 8.6 10*3/uL — ABNORMAL HIGH (ref 1.7–7.7)
Platelets: 317 10*3/uL (ref 150–400)
RBC: 3.08 MIL/uL — AB (ref 3.87–5.11)
RDW: 15.5 % (ref 11.5–15.5)
WBC: 9.9 10*3/uL (ref 4.0–10.5)

## 2016-02-17 LAB — COMPREHENSIVE METABOLIC PANEL
ALBUMIN: 2.1 g/dL — AB (ref 3.5–5.0)
ALK PHOS: 70 U/L (ref 38–126)
ALT: 12 U/L — AB (ref 14–54)
AST: 23 U/L (ref 15–41)
Anion gap: 12 (ref 5–15)
BUN: 53 mg/dL — ABNORMAL HIGH (ref 6–20)
CALCIUM: 8.6 mg/dL — AB (ref 8.9–10.3)
CHLORIDE: 112 mmol/L — AB (ref 101–111)
CO2: 19 mmol/L — AB (ref 22–32)
CREATININE: 1.78 mg/dL — AB (ref 0.44–1.00)
GFR calc Af Amer: 28 mL/min — ABNORMAL LOW (ref >60)
GFR calc non Af Amer: 24 mL/min — ABNORMAL LOW (ref >60)
GLUCOSE: 270 mg/dL — AB (ref 65–99)
Potassium: 4.6 mmol/L (ref 3.5–5.1)
SODIUM: 143 mmol/L (ref 135–145)
Total Bilirubin: 0.5 mg/dL (ref 0.3–1.2)
Total Protein: 7.6 g/dL (ref 6.5–8.1)

## 2016-02-17 LAB — I-STAT CG4 LACTIC ACID, ED: Lactic Acid, Venous: 3.75 mmol/L (ref 0.5–1.9)

## 2016-02-17 LAB — I-STAT TROPONIN, ED: Troponin i, poc: 0 ng/mL (ref 0.00–0.08)

## 2016-02-17 LAB — BRAIN NATRIURETIC PEPTIDE: B Natriuretic Peptide: 322.5 pg/mL — ABNORMAL HIGH (ref 0.0–100.0)

## 2016-02-17 MED ORDER — DEXAMETHASONE SODIUM PHOSPHATE 10 MG/ML IJ SOLN
10.0000 mg | Freq: Once | INTRAMUSCULAR | Status: AC
  Administered 2016-02-17: 10 mg via INTRAMUSCULAR
  Filled 2016-02-17: qty 1

## 2016-02-17 MED ORDER — AMOXICILLIN-POT CLAVULANATE 875-125 MG PO TABS: 1.0000 | ORAL_TABLET | Freq: Two times a day (BID) | ORAL | 0 refills | Status: AC

## 2016-02-17 MED ORDER — PREDNISONE 20 MG PO TABS: 50.0000 mg | ORAL_TABLET | Freq: Once | ORAL | Status: DC

## 2016-02-17 MED ORDER — DEXAMETHASONE 4 MG PO TABS
10.0000 mg | ORAL_TABLET | Freq: Once | ORAL | Status: DC
Start: 2016-02-17 — End: 2016-02-17

## 2016-02-17 MED ORDER — IPRATROPIUM-ALBUTEROL 0.5-2.5 (3) MG/3ML IN SOLN
3.0000 mL | Freq: Once | RESPIRATORY_TRACT | Status: AC
  Administered 2016-02-17: 3 mL via RESPIRATORY_TRACT
  Filled 2016-02-17: qty 3

## 2016-02-17 NOTE — ED Notes (Signed)
Family at bedside. 

## 2016-02-17 NOTE — ED Notes (Signed)
Paged Hospice RN to EDP

## 2016-02-17 NOTE — ED Triage Notes (Signed)
The patient's family said the patient was drinking water and she choked, water coming out of her nose and her mouth.  The patient's family advised she has advanced dementia and is on hospice.  The patient is complaining of back paain.

## 2016-02-17 NOTE — ED Notes (Signed)
Cleaned pt, applied dressing to pt's bottom and left hip per nurse.

## 2016-02-17 NOTE — ED Provider Notes (Signed)
[facility]-EMERGENCY DEPT Provider Note   CSN: [id number] Arrival date & time: 02/17/16  1246  First Provider Contact:  First MD Initiated Contact with Patient 02/17/16 1300        History   Chief Complaint Chief Complaint  Patient presents with  . Aspiration    The patient's family said the patient was drinking water and she choked, water coming out of her nose and her mouth.  The patient's family advised she has advanced dementia and is on hospice.  The patient is complaining of back paain.    HPI Kathleen [name] is a 82 y.o. female.  The history is provided by a relative.  Cough  This is a new problem. The current episode started 3 to 5 hours ago. The problem occurs constantly. The problem has not changed since onset.The cough is non-productive. There has been no fever. Associated symptoms include shortness of breath and wheezing. She has tried nothing for the symptoms. Risk factors: aspirated while drinking ensure and water. She is not a smoker. Past medical history comments: terminal dementia, hospice patient, CHF hx.    Past Medical History:  Diagnosis Date  . Anemia   . Arthritis   . Atrial fibrillation (HCC)   . CHF (congestive heart failure) (HCC)   . CHF (congestive heart failure) (HCC)   . Chronic kidney disease   . Dementia   . Generalized OA 12/16/2011   Significant. Seems to have encompass all joints. Patient is complaining of significant pain but dementia allows patient to be happy.  Marland Kitchen Heart murmur    aortic regurg  . Hypertension   . Osteoarthritis   . Osteoporosis   . Paroxysmal atrial fibrillation (HCC) 10/13/2010   Rate controlled Only on aspirin due to hx of bicep hematoma 09/2010) and significant fall risk   . Spinal stenosis   . SunDown syndrome   . Wenckebach second degree AV block 02/22/2013    Patient Active Problem List   Diagnosis Date Noted  . Decubitus ulcer of sacral region, stage 2 08/29/2015  . Risk for falls 10/29/2014  . Aspiration  pneumonia (HCC) 10/24/2014  . Malnutrition of moderate degree (HCC) 10/23/2014  . Cough 10/22/2014  . Acute respiratory failure with hypoxia (HCC)   . CAP (community acquired pneumonia)   . Slurred speech   . Hypothermia 08/10/2014  . Severe dementia 08/10/2014  . Dysphagia, pharyngoesophageal phase 08/10/2014  . Protein-calorie malnutrition, severe (HCC) 08/10/2014  . Acute respiratory failure with hypercapnia (HCC)   . Respiratory failure (HCC) 08/09/2014  . Essential hypertension, benign 06/29/2014  . Dementia without behavioral disturbance 06/29/2014  . Acute encephalopathy 09/17/2013  . Unintentional weight loss 06/13/2013  . Agitation 06/12/2013  . Difficulty walking 03/06/2013  . 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 Anemia 02/24/2012  . Generalized OA 12/16/2011  . Paroxysmal atrial fibrillation (HCC) 10/13/2010  . Kidney disease, chronic, stage III (GFR 30-59 ml/min) 10/13/2010  . Osteoporosis 10/13/2010  . CHF (congestive heart failure) (HCC) 10/13/2010    Past Surgical History:  Procedure Laterality Date  . NEPHRECTOMY Right   . TONSILLECTOMY      OB History    No data available       Home Medications    Prior to Admission medications   Medication Sig Start Date End Date Taking? Authorizing Provider  albuterol (PROVENTIL) (2.5 MG/3ML) 0.083% nebulizer solution Take 3 mLs (2.5 mg total) by nebulization every 6 (six) hours as needed for wheezing or shortness of breath. 08/11/14  Yes Nestor Ramp, MD  Alum & Mag Hydroxide-Simeth (MAGIC MOUTHWASH) SOLN Take 10 mLs by mouth 4 (four) times daily. Patient taking differently: Take 10 mLs by mouth 2 (two) times daily.  08/10/14  Yes Edsel Petrin, DO  amLODipine (NORVASC) 10 MG tablet Take 1 tablet (10 mg total) by mouth daily. 02/13/15  Yes Moses Manners, MD  artificial tears (LACRILUBE) OINT ophthalmic ointment Place into both eyes every 4 (four) hours as needed for dry eyes. 08/10/14  Yes Edsel Petrin, DO  aspirin 81 MG tablet Take 1 tablet (81 mg total) by mouth daily. 06/01/13  Yes Lonia Skinner, MD  collagenase (SANTYL) ointment Apply 1 application topically daily. 02/06/16  Yes Nestor Ramp, MD  Olopatadine HCl (PATADAY) 0.2 % SOLN APPLY 1 DROP TO EYE 2 (TWO) TIMES DAILY. Patient taking differently: 1 drop. APPLY 1 DROP TO EYE 2 (TWO) TIMES DAILY. 06/24/15  Yes Nestor Ramp, MD  Alum & Mag Hydroxide-Simeth (MAGIC MOUTHWASH) SOLN Take 5 mLs by mouth 4 (four) times daily as needed for mouth pain. Patient not taking: Reported on 02/17/2016 10/24/14   Bonney Aid, MD  amoxicillin-clavulanate (AUGMENTIN) 875-125 MG tablet Take 1 tablet by mouth every 12 (twelve) hours. 02/17/16 02/24/16  Lyndal Pulley, MD  azithromycin [facility]) 200 MG/5ML suspension Take by mouth 12.5 cc today and then 7.5 cc daily for 4 more days Patient not taking: Reported on 02/17/2016 11/28/15   Nestor Ramp, MD  erythromycin ophthalmic ointment Place a small amount into eye twice daily for 7 days Patient not taking: Reported on 02/17/2016 06/29/15   Nestor Ramp, MD  haloperidol (HALDOL) 2 MG/ML solution Take 0.5 mLs (1 mg total) by mouth every 6 (six) hours as needed for agitation. Patient not taking: Reported on 09/09/2014 08/10/14   Edsel Petrin, DO  ipratropium-albuterol (DUONEB) 0.5-2.5 (3) MG/3ML SOLN Take 3 mLs by nebulization every 4 (four) hours as needed. Patient taking differently: Take 3 mLs by nebulization.  10/28/14   Nestor Ramp, MD    Family History Family History  Problem Relation Age of Onset  . Hypertension Daughter     Social History Social History  Substance Use Topics  . Smoking status: Former Games developer  . Smokeless tobacco: Never Used     Comment: quit smoking in the 70's  . Alcohol use No     Allergies   Review of patient's allergies indicates no known allergies.   Review of Systems Review of Systems  Constitutional: Positive for appetite change (loss of appetite since  yesterday).  Respiratory: Positive for cough, shortness of breath and wheezing.   All other systems reviewed and are negative.    Physical Exam Updated Vital Signs BP 114/64   Pulse 112   Temp 98.7 F (37.1 C) (Rectal)   Resp (!) 34   SpO2 97%   Physical Exam  Constitutional: She appears listless. She appears ill. Face mask in place.  HENT:  Head: Normocephalic.  Eyes: Conjunctivae are normal.  Neck: Neck supple. No tracheal deviation present.  Cardiovascular: Normal rate and regular rhythm.   Pulmonary/Chest: Tachypnea noted. She has wheezes (R>L expiratory). She has rhonchi.  Abdominal: Soft. She exhibits no distension.  Musculoskeletal:  4cm sacral decubitus ulcer, stage 4  Neurological: She appears listless. She is disoriented. She displays atrophy.  Skin: Skin is warm and dry.  Psychiatric: Her affect is blunt.     ED Treatments / Results  Labs (all labs  ordered are listed, but only abnormal results are displayed) Labs Reviewed  CBC WITH DIFFERENTIAL/PLATELET - Abnormal; Notable for the following:       Result Value   RBC 3.08 (*)    Hemoglobin 8.5 (*)    HCT 25.7 (*)    Neutro Abs 8.6 (*)    All other components within normal limits  COMPREHENSIVE METABOLIC PANEL - Abnormal; Notable for the following:    Chloride 112 (*)    CO2 19 (*)    Glucose, Bld 270 (*)    BUN 53 (*)    Creatinine, Ser 1.78 (*)    Calcium 8.6 (*)    Albumin 2.1 (*)    ALT 12 (*)    GFR calc non Af Amer 24 (*)    GFR calc Af Amer 28 (*)    All other components within normal limits  BRAIN NATRIURETIC PEPTIDE - Abnormal; Notable for the following:    B Natriuretic Peptide 322.5 (*)    All other components within normal limits  I-STAT CG4 LACTIC ACID, ED - Abnormal; Notable for the following:    Lactic Acid, Venous 3.75 (*)    All other components within normal limits  I-STAT TROPOININ, ED    EKG  EKG Interpretation None       Radiology Dg Chest Port 1 View  Result  Date: 02/17/2016 CLINICAL DATA:  Hypoxia and history of CHF. EXAM: PORTABLE CHEST 1 VIEW COMPARISON:  10/23/2014 FINDINGS: Stable top-normal heart size. There is no evidence of pulmonary edema, consolidation, pneumothorax, nodule or pleural fluid. IMPRESSION: No active disease. Electronically Signed   By: Irish Lack M.D.   On: 02/17/2016 14:52    Procedures Procedures (including critical care time)  Medications Ordered in ED Medications  ipratropium-albuterol (DUONEB) 0.5-2.5 (3) MG/3ML nebulizer solution 3 mL (3 mLs Nebulization Given 02/17/16 1532)  dexamethasone (DECADRON) injection 10 mg (10 mg Intramuscular Given 02/17/16 1729)     Initial Impression / Assessment and Plan / ED Course  I have reviewed the triage vital signs and the nursing notes.  Pertinent labs & imaging results that were available during my care of the patient were reviewed by me and considered in my medical decision making (see chart for details).  Clinical Course    82 year old female presents from home hospice services with acute decompensation. She is breathing shallow on arrival and is altered from baseline. Per the family she was losing her appetite as of last night and this morning she was asking for an sure and water repetitively but then vomited multiple times and potentially choked. I discussed with the patient's son by phone who is her decision maker for the family regarding the patient's critical illness as she is saturating in the low 80s on a nonrebreather on arrival and has diffuse wheezing. She has a large sacral decubitus ulcer from ongoing stasis. I explained to the patient's family that she appeared critically ill and may pass away without intervention d/t swallowing issues and discussed quality of life at the end of life with them with home versus hospital placement.  The son requested workup with x-ray and blood work to determine the severity of illness and would prefer the patient to go home. She is  not a candidate for BiPAP and she is DO NOT RESUSCITATE/DO NOT INTUBATE per family report. Family consented to medical workup with CXR and labs, no IV placed and no breathing interventions except oxygen and neb.   Hospice nurse came to ED to  help arrange care. No airspace opacity on CXR but likely pneumonitis by history with an element of chronic CHF with elevated BNP. Given decadron to help mitigate inflammation and will cover with augmentin at home to ppx for aspiration pneumonia. Hospice to arrange oxygen at home, saturating well on 2L [place], nebulizer did not improve expiratory wheeze. Pt is end of life care and after discussion with family, would prefer her at home for supportive and comfort care. They may return at any time for recheck if desired, hospice to follow up on patient tonight.    Final Clinical Impressions(s) / ED Diagnoses   Final diagnoses:  Aspiration pneumonitis [facility])    New Prescriptions Discharge Medication List as of 02/17/2016  4:58 PM    START taking these medications   Details  amoxicillin-clavulanate (AUGMENTIN) 875-125 MG tablet Take 1 tablet by mouth every 12 (twelve) hours., Starting Wed 02/17/2016, Until Wed 02/24/2016, Print         Lyndal Pulley, MD 02/17/16 2139

## 2016-02-17 NOTE — ED Notes (Signed)
Repositioned pt in the bed 

## 2016-02-17 NOTE — Discharge Instructions (Signed)
Aspiration Precautions Aspiration is the breathing in (inhalation) of a liquid or object into the lungs. Things that can be inhaled into the lungs include:   Food.  Any type of liquid, such as drinks or saliva.  Stomach contents, such as vomit or stomach acid. When these things go into the lungs, damage can occur and serious complications can result, such as:  Lung infection (pneumonia).  Collection of infected liquid (pus) in the lungs (lung abscess).  Death. CAUSES The cause of aspiration may include:   A lowered level of awareness (consciousness) due to:  Traumatic brain injury or head injury.  Stroke.  Diseases of the nerves, brain, or spinal cord.  Seizures.  A problem with the gag reflex. The gag reflex protects the body from swallowing things too quickly or things that are too large.  Medical conditions that affect swallowing.  Conditions that affect the food pipe (esophagus).  Acid reflux. This is when stomach acid moves into the esophagus.  Any type of surgery where a medicine to sleep (general anesthetic)or relax (sedative) is given.  Alcohol abuse.  Illegal drug abuse.  Taking medicine that causes sleepiness, confusion, or weakness.  Aging.  Dental problems.  Having a feeding tube. SIGNS AND SYMPTOMS Symptoms of aspiration may include:   Coughing after swallowing food or liquids.  Difficulty breathing. This may include:  Breathing quickly.  Breathing very slowly.  Loud breathing.  Rumbling sounds from the lungs while breathing.  Coughing up phlegm (sputum) that:  Is yellow, tan, or green.  Has pieces of food in it.  Is bad smelling.  A change in voice so that it sounds scratchy.  A change in skin color. The skin may look red or blue.   Fever.  Watery eyes.  Pain in the chest or back.  A pained look on the face.   A feeling of fullness in the throat or that something is stuck in the throat. DIAGNOSIS Aspiration may  be diagnosed by:   Chest X-ray.  Bronchoscopy. This is a surgical procedure in which a thin, flexible tube with a camera is inserted into the nose or mouth to the lungs. The health care provider can then view the lungs.  A swallowing evaluation study to find out:  A person's risk of aspiration.  How difficult it is for a person to swallow.  What types of foods are safe for a person to eat. PREVENTION If you are caring for someone who can eat and drink through his or her mouth:   Have the person sit in an upright position when eating food or drinking fluids, such as:  Sitting up in a chair.  If sitting in a chair is not possible, position the person in bed so he or she is upright.  Remind the person to eat slowly and chew well.  Do not distract the person. This is especially important for people with thinking or memory (cognitive) problems.  Check the person's mouth for leftover food after eating.  Keep the person sitting upright for 30-45 minutes after eating.  Do not serve food or drink for at least 2 hours before bedtime. If you are caring for someone with a feeding tube who cannot eat or drink through his or her mouth:  Keep the person in an upright position as much as possible.  Do not  lay the person flat if he or she is getting continuous feedings. Turn the feeding pump off if you need to lay the person flat  for any reason.  Check feeding tube residuals as directed by your health care provider. Ask your health care provider what residual amount is too high. General guidelines to prevent aspiration in someone you are caring for include:  Feed small amounts of food. Do not force feed.  Food should be thickened as directed by the person's speech pathologist.  Use as little water as possible when brushing the person's teeth or cleaning his or her mouth.  Provide oral care before and after meals.  Never put food or liquids in the mouth of a person who is not fully  alert.  Crush pills and put them in soft food such as pudding or ice cream. Some pills should not be crushed. Check with your health care provider before crushing any medicine. SEEK MEDICAL CARE IF:  The person has a feeding tube and the feeding tube residual amount is too high.  The person has a fever.  The person tries to avoid food, such as refusing to eat or be fed, or is eating less than normal. SEEK IMMEDIATE MEDICAL CARE IF:   The person has trouble breathing or starts to breathe quickly.  The person is breathing very slowly or stops breathing.  The person coughs a lot after eating or drinking.  The person has a long-lasting (chronic) cough.  The person coughs up thick, yellow, or tan sputum. MAKE SURE YOU:   Understand these instructions.  Will watch the person's condition.  Will get help right away if the person is not doing well or gets worse.   This information is not intended to replace advice given to you by your health care provider. Make sure you discuss any questions you have with your health care provider.   Document Released: 07/30/2010 Document Revised: 07/18/2014 Document Reviewed: 10/02/2013 Elsevier Interactive Patient Education 2016 [place] is a service that is designed to provide people who are terminally ill and their families with medical, spiritual, and psychological support. Its aim is to improve your quality of life by keeping you as alert and comfortable as possible. Hospice is performed by a team of health care professionals and volunteers who:  Help keep you comfortable. Hospice can be provided in your home or in a homelike setting. The hospice staff works with your family and friends to help meet your needs. You will enjoy the support of loved ones by receiving much of your basic care from family and friends.  Provide pain relief and manage your symptoms. The staff supply all necessary medicines and equipment.  Provide  companionship when you are alone.  Allow you and your family to rest. They may do light housekeeping, prepare meals, and run errands.  Provide counseling. They will make sure your emotional, spiritual, and social needs and those of your family are being met.  Provide spiritual care. Spiritual care is individualized to meet your needs and your family's needs. It may involve helping you look at what death means to you, say goodbye, or perform a specific religious ceremony or ritual. Hospice teams often include:  A nurse.  A doctor.  Social workers.  Religious leaders (such as a Clinical biochemist).  Trained volunteers. WHEN SHOULD HOSPICE CARE BEGIN? Most people who use hospice are believed to have fewer than 6 months to live. Your family and health care providers can help you decide when hospice services should begin. If your condition improves, you may discontinue the program. [place]? Most hospice  programs are run by nonprofit, independent organizations. Some are affiliated with hospitals, nursing homes, or home health care agencies. Hospice programs can take place in the home or at a hospice center, hospital, or skilled nursing facility. When choosing a hospice program, ask the following questions:  What services are available to me?  What services are offered to my loved ones?  How involved are my loved ones?  How involved is my health care provider?  Who makes up the hospice care team? How are they trained or screened?  How will my pain and symptoms be managed?  If my circumstances change, can the services be provided in a different setting, such as my home or in the hospital?  Is the program reviewed and licensed by the state or certified in some other way? WHERE CAN I LEARN MORE ABOUT HOSPICE? You can learn about existing hospice programs in your area from your health care providers. You can also read more about hospice online. The websites  of the following organizations contain helpful information:  [facility] and Palliative Care Organization [facility]).  The Hospice Association of America ([place]).  The [place].  The American Cancer Society (ACS).  Hospice Net.   This information is not intended to replace advice given to you by your health care provider. Make sure you discuss any questions you have with your health care provider.   Document Released: 10/14/2003 Document Revised: 07/02/2013 Document Reviewed: 05/07/2013 Elsevier Interactive Patient Education [employer].

## 2016-02-18 ENCOUNTER — Telehealth: Payer: Self-pay | Admitting: *Deleted

## 2016-02-18 NOTE — Telephone Encounter (Signed)
Corrie Dandy, RN with [facility] left voice message on nurse line that patient was seen in ED 02/17/16.  Patient was seen for choking, aspiration and vomiting; drinking to much Ensure.  Pt was treat with Decadron 10 mg IV and nebulizer.  Patient discharged home with 10 Liters of oxygen.  Mary weaned patient down to 2 liters of oxygen with Sats at 94%.  Pt had rectal temp of 100.1, heart rate 116, lungs clear.  Please call with questions at [phone]. Clovis Pu, RN

## 2016-02-23 ENCOUNTER — Other Ambulatory Visit: Payer: Self-pay | Admitting: Family Medicine

## 2016-02-23 DIAGNOSIS — L89300 Pressure ulcer of unspecified buttock, unstageable: Secondary | ICD-10-CM

## 2016-02-23 DIAGNOSIS — L89309 Pressure ulcer of unspecified buttock, unspecified stage: Secondary | ICD-10-CM | POA: Insufficient documentation

## 2016-02-24 ENCOUNTER — Telehealth: Payer: Self-pay | Admitting: Family Medicine

## 2016-02-24 ENCOUNTER — Telehealth: Payer: Self-pay

## 2016-02-24 NOTE — Telephone Encounter (Signed)
-----   Message from Luther Parody sent at 02/24/2016  7:32 AM EDT ----- Regarding: Urgent Referral Dr. Jennette Kettle placed an "urgent"  Wound care referral for Ms. Barbara's mom.   Tia

## 2016-02-24 NOTE — Telephone Encounter (Signed)
Kathleen [name] will be taking over as her hospice nurse.  Pt has a severe ulcer.  A wound care nurse came into today and offered some treatment options. Pt is extremely uncomfortable when dressings are changed. Could dr neal prescibed some tramadol?   Please call julie back so she can put the orders into the computer so the wound can be treated

## 2016-02-24 NOTE — Telephone Encounter (Signed)
Left message for [name] (daughter) to call the office. Granddaughter called me back. I told her I had an appt for pt to see wound center and granddaughter told me that Hospice has gotten it taken care of. They now have a new nurse that just left and who is better qualified to take care of pt. I told her if she needs [place] to give [place] a call. I have cancelled appt with wound center. Sunday Spillers, CMA

## 2016-02-25 MED ORDER — TRAMADOL HCL 50 MG PO TABS: ORAL_TABLET | ORAL | 5 refills | Status: AC

## 2016-02-25 NOTE — Telephone Encounter (Signed)
Kathleen [name] Can you call in some tramadol for her and then let [name]---her new hoospice nurse know that I would follow the wound care nurse's recommendations (which I do not have). It would be great if they could fax the recs from [facility] nurse so I would have them. I am not sure exactly what orders they need but willing to give them what they recommend. THANKS! Denny Levy

## 2016-02-25 NOTE — Telephone Encounter (Signed)
Left Willette Pa, RN a voice message that Tramadol 50 mg 1 PO BID PRN for pain called into [facility] [address] with 5 refills.  Dr. Jennette Kettle is requesting the notes from wound care nurse to be faxed over to [facility] for review.  Dr. Jennette Kettle is willing to follow any recommendations from the wound care nurse.  Raynelle Fanning to call clinic back if needed.  Clovis Pu, RN

## 2016-03-10 ENCOUNTER — Other Ambulatory Visit: Payer: Self-pay

## 2016-05-04 ENCOUNTER — Encounter: Payer: Self-pay | Admitting: Family Medicine

## 2016-05-04 ENCOUNTER — Telehealth: Payer: Self-pay | Admitting: Family Medicine

## 2016-05-06 ENCOUNTER — Telehealth: Payer: Self-pay | Admitting: Family Medicine

## 2016-05-06 NOTE — Telephone Encounter (Signed)
D/C for patient placed in Dr. Donnetta Hail mailbox for completion. Please return to me once completed.

## 2016-05-11 NOTE — Progress Notes (Signed)
Notified by her granddaughter that she died peacefully last night at home. Under Hospice care.

## 2016-05-11 NOTE — Telephone Encounter (Signed)
Kathleen [name] passed away this morning.  Time of death was 8:50am

## 2016-05-11 NOTE — Telephone Encounter (Signed)
PCP is already aware but forwarding message to her anyway. Lamonte Sakai, Lennon Boutwell D, [place]

## 2016-05-11 DEATH — deceased
